# Patient Record
Sex: Female | Born: 1940 | Race: White | Hispanic: No | Marital: Married | Smoking: Never smoker
Health system: Southern US, Community
[De-identification: ages and names within clinical notes are randomized; demographics above are authoritative.]

## PROBLEM LIST (undated history)

## (undated) DIAGNOSIS — M51369 Other intervertebral disc degeneration, lumbar region without mention of lumbar back pain or lower extremity pain: Secondary | ICD-10-CM

## (undated) DIAGNOSIS — Z8619 Personal history of other infectious and parasitic diseases: Secondary | ICD-10-CM

## (undated) DIAGNOSIS — T8859XA Other complications of anesthesia, initial encounter: Secondary | ICD-10-CM

## (undated) DIAGNOSIS — M1990 Unspecified osteoarthritis, unspecified site: Secondary | ICD-10-CM

## (undated) DIAGNOSIS — R112 Nausea with vomiting, unspecified: Secondary | ICD-10-CM

## (undated) DIAGNOSIS — Z9221 Personal history of antineoplastic chemotherapy: Secondary | ICD-10-CM

## (undated) DIAGNOSIS — H9319 Tinnitus, unspecified ear: Secondary | ICD-10-CM

## (undated) DIAGNOSIS — Z780 Asymptomatic menopausal state: Secondary | ICD-10-CM

## (undated) DIAGNOSIS — Z923 Personal history of irradiation: Secondary | ICD-10-CM

## (undated) DIAGNOSIS — R42 Dizziness and giddiness: Secondary | ICD-10-CM

## (undated) DIAGNOSIS — R569 Unspecified convulsions: Secondary | ICD-10-CM

## (undated) DIAGNOSIS — T4145XA Adverse effect of unspecified anesthetic, initial encounter: Secondary | ICD-10-CM

## (undated) DIAGNOSIS — IMO0002 Reserved for concepts with insufficient information to code with codable children: Secondary | ICD-10-CM

## (undated) DIAGNOSIS — H8390 Unspecified disease of inner ear, unspecified ear: Secondary | ICD-10-CM

## (undated) DIAGNOSIS — C50919 Malignant neoplasm of unspecified site of unspecified female breast: Secondary | ICD-10-CM

## (undated) DIAGNOSIS — Z9889 Other specified postprocedural states: Secondary | ICD-10-CM

## (undated) DIAGNOSIS — M5136 Other intervertebral disc degeneration, lumbar region: Secondary | ICD-10-CM

## (undated) DIAGNOSIS — N200 Calculus of kidney: Secondary | ICD-10-CM

## (undated) HISTORY — PX: TUBAL LIGATION: SHX77

## (undated) HISTORY — PX: COLONOSCOPY: SHX174

## (undated) HISTORY — PX: CHOLECYSTECTOMY: SHX55

## (undated) HISTORY — PX: LITHOTRIPSY: SUR834

---

## 1995-02-22 HISTORY — PX: BREAST SURGERY: SHX581

## 1998-10-29 ENCOUNTER — Ambulatory Visit (HOSPITAL_COMMUNITY): Admission: RE | Admit: 1998-10-29 | Discharge: 1998-10-30 | Payer: Self-pay | Admitting: General Surgery

## 1999-02-09 ENCOUNTER — Encounter: Admission: RE | Admit: 1999-02-09 | Discharge: 1999-02-09 | Payer: Self-pay | Admitting: Internal Medicine

## 1999-04-01 ENCOUNTER — Encounter: Admission: RE | Admit: 1999-04-01 | Discharge: 1999-04-01 | Payer: Self-pay | Admitting: Internal Medicine

## 1999-04-01 ENCOUNTER — Encounter: Payer: Self-pay | Admitting: Internal Medicine

## 1999-09-30 ENCOUNTER — Encounter: Payer: Self-pay | Admitting: Emergency Medicine

## 1999-09-30 ENCOUNTER — Emergency Department (HOSPITAL_COMMUNITY): Admission: EM | Admit: 1999-09-30 | Discharge: 1999-09-30 | Payer: Self-pay | Admitting: Emergency Medicine

## 1999-10-04 ENCOUNTER — Encounter: Payer: Self-pay | Admitting: Urology

## 1999-10-04 ENCOUNTER — Ambulatory Visit (HOSPITAL_COMMUNITY): Admission: RE | Admit: 1999-10-04 | Discharge: 1999-10-04 | Payer: Self-pay | Admitting: Urology

## 2000-04-04 ENCOUNTER — Encounter: Payer: Self-pay | Admitting: Internal Medicine

## 2000-04-04 ENCOUNTER — Encounter: Admission: RE | Admit: 2000-04-04 | Discharge: 2000-04-04 | Payer: Self-pay | Admitting: Internal Medicine

## 2001-02-19 ENCOUNTER — Encounter: Admission: RE | Admit: 2001-02-19 | Discharge: 2001-02-19 | Payer: Self-pay | Admitting: Internal Medicine

## 2001-02-19 ENCOUNTER — Encounter: Payer: Self-pay | Admitting: Internal Medicine

## 2001-04-06 ENCOUNTER — Encounter: Payer: Self-pay | Admitting: Internal Medicine

## 2001-04-06 ENCOUNTER — Encounter: Admission: RE | Admit: 2001-04-06 | Discharge: 2001-04-06 | Payer: Self-pay | Admitting: Internal Medicine

## 2002-04-17 ENCOUNTER — Encounter: Admission: RE | Admit: 2002-04-17 | Discharge: 2002-04-17 | Payer: Self-pay | Admitting: Internal Medicine

## 2002-04-17 ENCOUNTER — Encounter: Payer: Self-pay | Admitting: Internal Medicine

## 2002-04-24 ENCOUNTER — Encounter: Payer: Self-pay | Admitting: Internal Medicine

## 2002-04-24 ENCOUNTER — Encounter: Admission: RE | Admit: 2002-04-24 | Discharge: 2002-04-24 | Payer: Self-pay | Admitting: Internal Medicine

## 2003-05-23 ENCOUNTER — Encounter: Admission: RE | Admit: 2003-05-23 | Discharge: 2003-05-23 | Payer: Self-pay | Admitting: Dentistry

## 2003-06-23 ENCOUNTER — Encounter (INDEPENDENT_AMBULATORY_CARE_PROVIDER_SITE_OTHER): Payer: Self-pay | Admitting: Specialist

## 2003-06-23 ENCOUNTER — Ambulatory Visit (HOSPITAL_COMMUNITY): Admission: RE | Admit: 2003-06-23 | Discharge: 2003-06-23 | Payer: Self-pay | Admitting: Gastroenterology

## 2004-06-04 ENCOUNTER — Encounter: Admission: RE | Admit: 2004-06-04 | Discharge: 2004-06-04 | Payer: Self-pay | Admitting: Internal Medicine

## 2005-06-08 ENCOUNTER — Encounter: Admission: RE | Admit: 2005-06-08 | Discharge: 2005-06-08 | Payer: Self-pay | Admitting: Internal Medicine

## 2006-06-13 ENCOUNTER — Encounter: Admission: RE | Admit: 2006-06-13 | Discharge: 2006-06-13 | Payer: Self-pay | Admitting: Internal Medicine

## 2007-07-09 ENCOUNTER — Encounter: Admission: RE | Admit: 2007-07-09 | Discharge: 2007-07-09 | Payer: Self-pay | Admitting: Internal Medicine

## 2008-07-09 ENCOUNTER — Encounter: Admission: RE | Admit: 2008-07-09 | Discharge: 2008-07-09 | Payer: Self-pay | Admitting: Internal Medicine

## 2009-07-13 ENCOUNTER — Encounter: Admission: RE | Admit: 2009-07-13 | Discharge: 2009-07-13 | Payer: Self-pay | Admitting: Internal Medicine

## 2009-07-21 ENCOUNTER — Encounter: Admission: RE | Admit: 2009-07-21 | Discharge: 2009-07-21 | Payer: Self-pay | Admitting: Internal Medicine

## 2010-03-14 ENCOUNTER — Encounter: Payer: Self-pay | Admitting: Internal Medicine

## 2010-07-09 NOTE — Op Note (Signed)
NAME:  Kimberly [name], Kimberly [name]                         ACCOUNT NO.:  [id number]   MEDICAL RECORD NO.:  [id number]                   PATIENT TYPE:  AMB   LOCATION:  ENDO                                 FACILITY:  [facility]   PHYSICIAN:  Bernette Redbird, M.D.                DATE OF BIRTH:  03/24/1940   DATE OF PROCEDURE:  06/23/2003  DATE OF DISCHARGE:                                 OPERATIVE REPORT   PROCEDURE:  Colonoscopy with biopsy.   INDICATIONS:  Family history of colon polyps in a 70 year old female.   FINDINGS:  Diminutive early polyp in the ascending colon, removed by cold  biopsy.   DESCRIPTION OF PROCEDURE:  The nature, purpose and risks of this procedure  have been discussed with the patient who provided written consent.  Sedation  was fentanyl 50 mcg and Versed 5 mg IV without arrhythmias or desaturation.  The Olympus adjustable tension pediatric video coloscope was easily advanced  to the cecum as identified by clear visualization of the appendiceal orifice  and pull-back was then performed.  The quality of the prep was excellent and  it was felt that all areas were well seen.   There was a 2 mm sessile lesion consistent with an early polyp or perhaps a  mucosal tag in the mid ascending colonoscopy removed by a single cold  biopsy.  No other polyps were seen and there was n evidence of cancer,  colitis, vascular malformations, or diverticulosis.  Retroflexion was  attempted in the rectum but could not readily be accomplished, but careful  antegrade viewing of the rectum as well as reinspection of the rectosigmoid  was unremarkable.  The patient tolerated the procedure well and there were  no apparent complications.   IMPRESSION:  1. Colon polyp removed by cold biopsy technique as described above.     (211.3).  2. Family history of colon polyps.   PLAN:  Await pathology, probable colonoscopic followup in five years in view  of the family history.                        Bernette Redbird, M.D.    RB/MEDQ  D:  06/23/2003  T:  06/23/2003  Job:  [id number]   cc:   Bethann Punches  [address] [place]  [place] [address]  Fax: [phone]

## 2010-07-12 ENCOUNTER — Other Ambulatory Visit: Payer: Self-pay | Admitting: Internal Medicine

## 2010-07-12 DIAGNOSIS — Z1231 Encounter for screening mammogram for malignant neoplasm of breast: Secondary | ICD-10-CM

## 2010-08-04 ENCOUNTER — Ambulatory Visit
Admission: RE | Admit: 2010-08-04 | Discharge: 2010-08-04 | Disposition: A | Discharge status: Home / Self Care | Payer: Medicare Other | Source: Ambulatory Visit | Attending: Internal Medicine | Admitting: Internal Medicine

## 2010-08-04 DIAGNOSIS — Z1231 Encounter for screening mammogram for malignant neoplasm of breast: Secondary | ICD-10-CM

## 2010-12-04 ENCOUNTER — Ambulatory Visit: Payer: Self-pay

## 2011-07-19 ENCOUNTER — Other Ambulatory Visit: Payer: Self-pay | Admitting: Internal Medicine

## 2011-07-19 DIAGNOSIS — Z1231 Encounter for screening mammogram for malignant neoplasm of breast: Secondary | ICD-10-CM

## 2011-08-09 ENCOUNTER — Ambulatory Visit
Admission: RE | Admit: 2011-08-09 | Discharge: 2011-08-09 | Disposition: A | Discharge status: Home / Self Care | Payer: BC Managed Care – PPO | Source: Ambulatory Visit | Attending: Internal Medicine | Admitting: Internal Medicine

## 2011-08-09 DIAGNOSIS — Z1231 Encounter for screening mammogram for malignant neoplasm of breast: Secondary | ICD-10-CM

## 2012-04-10 ENCOUNTER — Other Ambulatory Visit: Payer: Self-pay | Admitting: Orthopedic Surgery

## 2012-04-10 MED ORDER — BUPIVACAINE LIPOSOME 1.3 % IJ SUSP: 20.0000 mL | Freq: Once | INTRAMUSCULAR | Status: DC

## 2012-04-10 MED ORDER — DEXAMETHASONE SODIUM PHOSPHATE 10 MG/ML IJ SOLN: 10.0000 mg | Freq: Once | INTRAMUSCULAR | Status: DC

## 2012-04-10 NOTE — Progress Notes (Signed)
Preoperative surgical orders have been place into the Epic hospital system for TRISHA KEN on 04/10/2012, 12:08 PM  by Patrica Duel for surgery on 05/14/12.  Preop Total Knee orders including Experal, IV Tylenol, and IV Decadron as long as there are no contraindications to the above medications. Avel Peace, PA-C

## 2012-04-27 ENCOUNTER — Encounter (HOSPITAL_COMMUNITY): Payer: Self-pay | Admitting: Pharmacy Technician

## 2012-05-03 ENCOUNTER — Encounter (HOSPITAL_COMMUNITY)
Admission: RE | Admit: 2012-05-03 | Discharge: 2012-05-03 | Disposition: A | Discharge status: Home / Self Care | Payer: Medicare Other | Source: Ambulatory Visit | Attending: Orthopedic Surgery | Admitting: Orthopedic Surgery

## 2012-05-03 ENCOUNTER — Encounter (HOSPITAL_COMMUNITY): Payer: Self-pay

## 2012-05-03 HISTORY — DX: Other intervertebral disc degeneration, lumbar region: M51.36

## 2012-05-03 HISTORY — DX: Other intervertebral disc degeneration, lumbar region without mention of lumbar back pain or lower extremity pain: M51.369

## 2012-05-03 HISTORY — DX: Unspecified osteoarthritis, unspecified site: M19.90

## 2012-05-03 HISTORY — DX: Unspecified convulsions: R56.9

## 2012-05-03 LAB — URINALYSIS, ROUTINE W REFLEX MICROSCOPIC
Glucose, UA: NEGATIVE mg/dL
Hgb urine dipstick: NEGATIVE
Leukocytes, UA: NEGATIVE
pH: 5 (ref 5.0–8.0)

## 2012-05-03 LAB — SURGICAL PCR SCREEN
MRSA, PCR: NEGATIVE
Staphylococcus aureus: POSITIVE — AB

## 2012-05-03 LAB — CBC
Hemoglobin: 14.2 g/dL (ref 12.0–15.0)
MCH: 28.2 pg (ref 26.0–34.0)
MCV: 88.3 fL (ref 78.0–100.0)
Platelets: 262 10*3/uL (ref 150–400)
RBC: 5.04 MIL/uL (ref 3.87–5.11)
WBC: 6 10*3/uL (ref 4.0–10.5)

## 2012-05-03 LAB — COMPREHENSIVE METABOLIC PANEL
ALT: 36 U/L — ABNORMAL HIGH (ref 0–35)
AST: 36 U/L (ref 0–37)
CO2: 28 mEq/L (ref 19–32)
Calcium: 10.1 mg/dL (ref 8.4–10.5)
Chloride: 105 mEq/L (ref 96–112)
Creatinine, Ser: 0.82 mg/dL (ref 0.50–1.10)
GFR calc Af Amer: 83 mL/min — ABNORMAL LOW (ref >90)
GFR calc non Af Amer: 71 mL/min — ABNORMAL LOW (ref >90)
Glucose, Bld: 102 mg/dL — ABNORMAL HIGH (ref 70–99)
Sodium: 140 mEq/L (ref 135–145)
Total Bilirubin: 0.4 mg/dL (ref 0.3–1.2)

## 2012-05-03 NOTE — Patient Instructions (Signed)
YOUR SURGERY IS SCHEDULED AT [facility]  ON:  Monday  3/24  REPORT TO [place] [facility] AT:  5:00 AM      PHONE # FOR SHORT STAY IS [phone]  DO NOT EAT OR DRINK ANYTHING AFTER MIDNIGHT THE NIGHT BEFORE YOUR SURGERY.  YOU MAY BRUSH YOUR TEETH, RINSE OUT YOUR MOUTH--BUT NO WATER, NO FOOD, NO CHEWING GUM, NO MINTS, NO CANDIES, NO CHEWING TOBACCO.  PLEASE TAKE THE FOLLOWING MEDICATIONS THE AM OF YOUR SURGERY WITH A FEW SIPS OF WATER:  CLARITIN  IF YOU USE INHALERS--USE YOUR INHALERS THE AM OF YOUR SURGERY AND BRING INHALERS TO THE HOSPITAL.    IF YOU ARE DIABETIC:  DO NOT TAKE ANY DIABETIC MEDICATIONS THE AM OF YOUR SURGERY.  IF YOU TAKE INSULIN IN THE EVENINGS--PLEASE ONLY TAKE 1/2 NORMAL EVENING DOSE THE NIGHT BEFORE YOUR SURGERY.  NO INSULIN THE AM OF YOUR SURGERY.  IF YOU HAVE SLEEP APNEA AND USE CPAP OR BIPAP--PLEASE BRING THE MASK AND THE TUBING.  DO NOT BRING YOUR MACHINE.  DO NOT BRING VALUABLES, MONEY, CREDIT CARDS.  DO NOT WEAR JEWELRY, MAKE-UP, NAIL POLISH AND NO METAL PINS OR CLIPS IN YOUR HAIR. CONTACT LENS, DENTURES / PARTIALS, GLASSES SHOULD NOT BE WORN TO SURGERY AND IN MOST CASES-HEARING AIDS WILL NEED TO BE REMOVED.  BRING YOUR GLASSES CASE, ANY EQUIPMENT NEEDED FOR YOUR CONTACT LENS. FOR PATIENTS ADMITTED TO THE HOSPITAL--CHECK OUT TIME THE DAY OF DISCHARGE IS 11:00 AM.  ALL INPATIENT ROOMS ARE PRIVATE - WITH BATHROOM, TELEPHONE, TELEVISION AND WIFI INTERNET.  IF YOU ARE BEING DISCHARGED THE SAME DAY OF YOUR SURGERY--YOU CAN NOT DRIVE YOURSELF HOME--AND SHOULD NOT GO HOME ALONE BY TAXI OR BUS.  NO DRIVING OR OPERATING MACHINERY FOR 24 HOURS FOLLOWING ANESTHESIA / PAIN MEDICATIONS.  PLEASE MAKE ARRANGEMENTS FOR SOMEONE TO BE WITH YOU AT HOME THE FIRST 24 HOURS AFTER SURGERY. RESPONSIBLE DRIVER'S NAME___________________________                                               PHONE #   _______________________                               PLEASE READ OVER  ANY  FACT SHEETS THAT YOU WERE GIVEN: MRSA INFORMATION, BLOOD TRANSFUSION INFORMATION, INCENTIVE SPIROMETER INFORMATION. FAILURE TO FOLLOW THESE INSTRUCTIONS MAY RESULT IN THE CANCELLATION OF YOUR SURGERY.   PATIENT SIGNATURE_________________________________

## 2012-05-03 NOTE — Pre-Procedure Instructions (Signed)
PT BROUGHT HER EKG REPORT FROM DR. M. Hyacinth Meeker - DONE 03/28/12.  HER MEDICAL CLEARANCE FROM DR. MILLER IS ON HER CHART.  CXR NOT NEEDED PREOP.

## 2012-05-13 ENCOUNTER — Other Ambulatory Visit: Payer: Self-pay | Admitting: Orthopedic Surgery

## 2012-05-13 NOTE — H&P (Signed)
Kimberly [name]  DOB: November 21, 1940 Married / Language: English / Race: White Female  Date of Admission:  05/14/2012  Chief Complaint:  Right Knee Pain  History of Present Illness The patient is a 72 year old female who comes in for a preoperative History and Physical. The patient is scheduled for a right total knee arthroplasty to be performed by Dr. Gus Rankin. [name], MD at [facility] on 05/14/2012. The patient is a 72 year old female who presents for follow up of their knee. The patient is being followed for their bilateral osteoarthritis. They are now 6 week(s) out from bilateral cortisone injections (that did not provide any relief). Symptoms reported today include: pain (constant). The following medication has been used for pain control: Tylenol (arthritis). The patient did not have any benefit at all from the cortisone injections. Her right knee is far worse than the left. It is now giving out on her to the point where she is fearful of falling. She has had several near falls because of that knee. She called earlier and gone ahead and scheduled the right total knee as had been previously discussed. She is ready to proceed. They have been treated conservatively in the past for the above stated problem and despite conservative measures, they continue to have progressive pain and severe functional limitations and dysfunction. They have failed non-operative management including home exercise, medications, and injections. It is felt that they would benefit from undergoing total joint replacement. Risks and benefits of the procedure have been discussed with the patient and they elect to proceed with surgery. There are no active contraindications to surgery such as ongoing infection or rapidly progressive neurological disease.    Problem List Osteoarthritis, knee (715.96)   Allergies Codeine/Codeine Derivatives   Family History Cancer. father Cerebrovascular  Accident. father Congestive Heart Failure. mother Depression. mother Heart Disease. father Kidney disease. grandmother mothers side   Social History Tobacco use. Never smoker. never smoker Alcohol use. never consumed alcohol Children. 1 Current work status. retired Financial planner (Currently). no Exercise. Exercises never Illicit drug use. no Living situation. live with spouse Marital status. married Number of flights of stairs before winded. less than 1 Pain Contract. no Previously in rehab. no Tobacco / smoke exposure. no   Medication History Tylenol (1 (one) Oral) Specific dose unknown - Active. Vitamin C ( Oral) Specific dose unknown - Active. Vitamin B6 ( Oral) Specific dose unknown - Active. Calcium 600 ( Oral) Specific dose unknown - Active. Claritin ( Oral) Specific dose unknown - Active. Multivitamins ( Oral) Specific dose unknown - Active. Vitamin D ( Oral) Specific dose unknown - Active.   Past Surgical History Gallbladder Surgery. laporoscopic Other Surgery. KIDNEY STONE REMOVAL LEFT Tubal Ligation   Medical History Kidney Stone Seizure Disorder. one episode in 1963 Measles Mumps Menopause Degenerative Disc Disease   Review of Systems General:Not Present- Chills, Fever, Night Sweats, Appetite Loss, Fatigue, Feeling sick, Weight Gain and Weight Loss. Skin:Not Present- Itching, Rash, Skin Color Changes, Ulcer, Psoriasis and Change in Hair or Nails. HEENT:Not Present- Sensitivity to light, Nose Bleed, Visual Loss, Decreased Hearing and Ringing in the Ears. Neck:Not Present- Swollen Glands and Neck Mass. Respiratory:Not Present- Shortness of breath, Snoring, Chronic Cough and Bloody sputum. Cardiovascular:Not Present- Shortness of Breath, Chest Pain, Swelling of Extremities, Leg Cramps and Palpitations. Gastrointestinal:Not Present- Bloody Stool, Heartburn, Abdominal Pain, Vomiting, Nausea and Incontinence of  Stool. Female Genitourinary:Not Present- Blood in Urine, Irregular/missing periods, Frequency, Incontinence and Nocturia. Musculoskeletal:Present- Joint Stiffness,  Joint Pain and Back Pain. Not Present- Muscle Weakness, Muscle Pain and Joint Swelling. Neurological:Not Present- Tingling, Numbness, Burning, Tremor, Headaches and Dizziness. Psychiatric:Not Present- Anxiety, Depression and Memory Loss. Endocrine:Not Present- Cold Intolerance, Heat Intolerance, Excessive hunger and Excessive Thirst. Hematology:Not Present- Abnormal Bleeding, Abnormal bruising, Anemia and Blood Clots.   Vitals Weight: 229 lb Height: 65 in Weight was reported by patient. Height was reported by patient. Body Surface Area: 2.18 m Body Mass Index: 38.11 kg/m Pulse: 60 (Regular) Resp.: 12 (Unlabored) BP: 122/78 (Sitting, Right Arm, Standard)    Physical Exam The physical exam findings are as follows:  Note: Patient is a 72 year old female with continued knee pain. Patient is accompanied today by her husband.   General Mental Status - Alert, cooperative and good historian. General Appearance- pleasant. Not in acute distress. Orientation- Oriented X3. Build & Nutrition- Well nourished and Well developed.   Head and Neck Head- normocephalic, atraumatic . Neck Global Assessment- supple. no bruit auscultated on the right and no bruit auscultated on the left.   Eye Pupil- Bilateral- Regular and Round. Motion- Bilateral- EOMI.   Chest and Lung Exam Auscultation: Breath sounds:- clear at anterior chest wall and - clear at posterior chest wall. Adventitious sounds:- No Adventitious sounds.   Cardiovascular Auscultation:Rhythm- Regular rate and rhythm. Heart Sounds- S1 WNL and S2 WNL. Murmurs & Other Heart Sounds:Auscultation of the heart reveals - No Murmurs.   Abdomen Palpation/Percussion:Tenderness- Abdomen is non-tender to palpation. Rigidity  (guarding)- Abdomen is soft. Auscultation:Auscultation of the abdomen reveals - Bowel sounds normal.   Female Genitourinary  Not done, not pertinent to present illness  Musculoskeletal On exam well developed female in no distress. Her right knee shows a valgus deformity. Her range of motion is about 5 to 125. There is no instability noted.  RADIOGRAPHS: AP both knees and lateral of the right taken a couple months ago, and shows bone on bone arthritis lateral and patellofemoral compartments of the right knee with about 10-15 degrees valgus deformity.  Assessment & Plan Osteoarthritis, knee (715.96) Impression: Right Knee  Note: Plan is for a Right Total Knee Replacement by Dr. Lequita Halt.  Plan is to go home.  PCP - Dr. Bethann Punches Patient has been seen preoperatively and felt to be stable for surgery.   Signed electronically by Roberts Gaudy, PA-C

## 2012-05-14 ENCOUNTER — Encounter (HOSPITAL_COMMUNITY): Payer: Self-pay | Admitting: *Deleted

## 2012-05-14 ENCOUNTER — Inpatient Hospital Stay (HOSPITAL_COMMUNITY)
Admission: RE | Admit: 2012-05-14 | Discharge: 2012-05-16 | DRG: 470 | Disposition: A | Discharge status: Home Health | Payer: Medicare Other | Source: Ambulatory Visit | Attending: Orthopedic Surgery | Admitting: Orthopedic Surgery

## 2012-05-14 ENCOUNTER — Inpatient Hospital Stay (HOSPITAL_COMMUNITY): Payer: Medicare Other | Admitting: *Deleted

## 2012-05-14 ENCOUNTER — Encounter (HOSPITAL_COMMUNITY)
Admission: RE | Disposition: A | Discharge status: Home Health | Payer: Self-pay | Source: Ambulatory Visit | Attending: Orthopedic Surgery

## 2012-05-14 DIAGNOSIS — Z01812 Encounter for preprocedural laboratory examination: Secondary | ICD-10-CM

## 2012-05-14 DIAGNOSIS — M1710 Unilateral primary osteoarthritis, unspecified knee: Principal | ICD-10-CM | POA: Diagnosis present

## 2012-05-14 DIAGNOSIS — Z96651 Presence of right artificial knee joint: Secondary | ICD-10-CM

## 2012-05-14 DIAGNOSIS — M179 Osteoarthritis of knee, unspecified: Secondary | ICD-10-CM | POA: Diagnosis present

## 2012-05-14 HISTORY — PX: TOTAL KNEE ARTHROPLASTY: SHX125

## 2012-05-14 SURGERY — ARTHROPLASTY, KNEE, TOTAL
Anesthesia: Spinal | Site: Knee | Laterality: Right | Wound class: Clean

## 2012-05-14 MED ORDER — PROPOFOL 10 MG/ML IV EMUL
INTRAVENOUS | Status: DC | PRN
  Administered 2012-05-14: 75 ug/kg/min via INTRAVENOUS

## 2012-05-14 MED ORDER — 0.9 % SODIUM CHLORIDE (POUR BTL) OPTIME
TOPICAL | Status: DC | PRN
  Administered 2012-05-14: 1000 mL

## 2012-05-14 MED ORDER — ACETAMINOPHEN 10 MG/ML IV SOLN
1000.0000 mg | Freq: Four times a day (QID) | INTRAVENOUS | Status: AC
  Administered 2012-05-14 – 2012-05-15 (×4): 1000 mg via INTRAVENOUS
  Filled 2012-05-14 (×7): qty 100

## 2012-05-14 MED ORDER — TRAMADOL HCL 50 MG PO TABS: 50.0000 mg | ORAL_TABLET | Freq: Four times a day (QID) | ORAL | Status: DC | PRN

## 2012-05-14 MED ORDER — CEFAZOLIN SODIUM-DEXTROSE 2-3 GM-% IV SOLR
2.0000 g | Freq: Four times a day (QID) | INTRAVENOUS | Status: AC
  Administered 2012-05-14 (×2): 2 g via INTRAVENOUS
  Filled 2012-05-14 (×2): qty 50

## 2012-05-14 MED ORDER — DEXAMETHASONE SODIUM PHOSPHATE 10 MG/ML IJ SOLN: 10.0000 mg | Freq: Once | INTRAMUSCULAR | Status: DC

## 2012-05-14 MED ORDER — CEFAZOLIN SODIUM-DEXTROSE 2-3 GM-% IV SOLR
2.0000 g | INTRAVENOUS | Status: AC
  Administered 2012-05-14: 2 g via INTRAVENOUS

## 2012-05-14 MED ORDER — BISACODYL 10 MG RE SUPP: 10.0000 mg | Freq: Every day | RECTAL | Status: DC | PRN

## 2012-05-14 MED ORDER — MIDAZOLAM HCL 5 MG/5ML IJ SOLN
INTRAMUSCULAR | Status: DC | PRN
  Administered 2012-05-14 (×2): 1 mg via INTRAVENOUS

## 2012-05-14 MED ORDER — MORPHINE SULFATE 2 MG/ML IJ SOLN
1.0000 mg | INTRAMUSCULAR | Status: DC | PRN
  Administered 2012-05-14: 0.5 mg via INTRAVENOUS
  Filled 2012-05-14: qty 1

## 2012-05-14 MED ORDER — ACETAMINOPHEN 325 MG PO TABS
650.0000 mg | ORAL_TABLET | Freq: Four times a day (QID) | ORAL | Status: DC | PRN
  Administered 2012-05-15 – 2012-05-16 (×2): 650 mg via ORAL
  Filled 2012-05-14 (×2): qty 2

## 2012-05-14 MED ORDER — MENTHOL 3 MG MT LOZG: 1.0000 | LOZENGE | OROMUCOSAL | Status: DC | PRN

## 2012-05-14 MED ORDER — HYDROMORPHONE HCL PF 1 MG/ML IJ SOLN: 0.2500 mg | INTRAMUSCULAR | Status: DC | PRN

## 2012-05-14 MED ORDER — METOCLOPRAMIDE HCL 10 MG PO TABS: 5.0000 mg | ORAL_TABLET | Freq: Three times a day (TID) | ORAL | Status: DC | PRN

## 2012-05-14 MED ORDER — ACETAMINOPHEN 10 MG/ML IV SOLN: 1000.0000 mg | Freq: Once | INTRAVENOUS | Status: DC

## 2012-05-14 MED ORDER — LACTATED RINGERS IV SOLN: INTRAVENOUS | Status: DC

## 2012-05-14 MED ORDER — OXYCODONE HCL 5 MG PO TABS
5.0000 mg | ORAL_TABLET | ORAL | Status: DC | PRN
  Administered 2012-05-14 – 2012-05-16 (×11): 5 mg via ORAL
  Filled 2012-05-14 (×11): qty 1

## 2012-05-14 MED ORDER — BUPIVACAINE LIPOSOME 1.3 % IJ SUSP
20.0000 mL | Freq: Once | INTRAMUSCULAR | Status: DC
  Filled 2012-05-14: qty 20

## 2012-05-14 MED ORDER — DOCUSATE SODIUM 100 MG PO CAPS
100.0000 mg | ORAL_CAPSULE | Freq: Two times a day (BID) | ORAL | Status: DC
  Administered 2012-05-14 – 2012-05-16 (×5): 100 mg via ORAL

## 2012-05-14 MED ORDER — ONDANSETRON HCL 4 MG/2ML IJ SOLN
4.0000 mg | Freq: Four times a day (QID) | INTRAMUSCULAR | Status: DC | PRN
  Administered 2012-05-14: 4 mg via INTRAVENOUS
  Filled 2012-05-14: qty 2

## 2012-05-14 MED ORDER — DIPHENHYDRAMINE HCL 12.5 MG/5ML PO ELIX: 12.5000 mg | ORAL_SOLUTION | ORAL | Status: DC | PRN

## 2012-05-14 MED ORDER — MEPERIDINE HCL 50 MG/ML IJ SOLN: 6.2500 mg | INTRAMUSCULAR | Status: DC | PRN

## 2012-05-14 MED ORDER — BUPIVACAINE IN DEXTROSE 0.75-8.25 % IT SOLN
INTRATHECAL | Status: DC | PRN
  Administered 2012-05-14: 2 mL via INTRATHECAL

## 2012-05-14 MED ORDER — PROMETHAZINE HCL 25 MG/ML IJ SOLN
6.2500 mg | INTRAMUSCULAR | Status: DC | PRN
  Administered 2012-05-14: 6.25 mg via INTRAVENOUS

## 2012-05-14 MED ORDER — POLYETHYLENE GLYCOL 3350 17 G PO PACK: 17.0000 g | PACK | Freq: Every day | ORAL | Status: DC | PRN

## 2012-05-14 MED ORDER — RIVAROXABAN 10 MG PO TABS
10.0000 mg | ORAL_TABLET | Freq: Every day | ORAL | Status: DC
  Administered 2012-05-15 – 2012-05-16 (×2): 10 mg via ORAL
  Filled 2012-05-14 (×3): qty 1

## 2012-05-14 MED ORDER — METHOCARBAMOL 100 MG/ML IJ SOLN: 500.0000 mg | Freq: Four times a day (QID) | INTRAMUSCULAR | Status: DC | PRN

## 2012-05-14 MED ORDER — DEXAMETHASONE 6 MG PO TABS
10.0000 mg | ORAL_TABLET | Freq: Once | ORAL | Status: DC
  Filled 2012-05-14: qty 1

## 2012-05-14 MED ORDER — FLEET ENEMA 7-19 GM/118ML RE ENEM: 1.0000 | ENEMA | Freq: Once | RECTAL | Status: AC | PRN

## 2012-05-14 MED ORDER — PHENYLEPHRINE HCL 10 MG/ML IJ SOLN
INTRAMUSCULAR | Status: DC | PRN
  Administered 2012-05-14: 20 ug via INTRAVENOUS
  Administered 2012-05-14 (×5): 40 ug via INTRAVENOUS

## 2012-05-14 MED ORDER — LORATADINE 10 MG PO TABS
10.0000 mg | ORAL_TABLET | Freq: Every day | ORAL | Status: DC
  Administered 2012-05-14 – 2012-05-16 (×3): 10 mg via ORAL
  Filled 2012-05-14 (×3): qty 1

## 2012-05-14 MED ORDER — SODIUM CHLORIDE 0.9 % IR SOLN
Status: DC | PRN
  Administered 2012-05-14: 1000 mL

## 2012-05-14 MED ORDER — METOCLOPRAMIDE HCL 5 MG/ML IJ SOLN: 5.0000 mg | Freq: Three times a day (TID) | INTRAMUSCULAR | Status: DC | PRN

## 2012-05-14 MED ORDER — EPHEDRINE SULFATE 50 MG/ML IJ SOLN
INTRAMUSCULAR | Status: DC | PRN
  Administered 2012-05-14: 5 mg via INTRAVENOUS

## 2012-05-14 MED ORDER — ACETAMINOPHEN 650 MG RE SUPP: 650.0000 mg | Freq: Four times a day (QID) | RECTAL | Status: DC | PRN

## 2012-05-14 MED ORDER — SODIUM CHLORIDE 0.9 % IJ SOLN
INTRAMUSCULAR | Status: DC | PRN
  Administered 2012-05-14: 08:00:00

## 2012-05-14 MED ORDER — CHLORHEXIDINE GLUCONATE 4 % EX LIQD
60.0000 mL | Freq: Once | CUTANEOUS | Status: DC
  Filled 2012-05-14: qty 60

## 2012-05-14 MED ORDER — ONDANSETRON HCL 4 MG PO TABS: 4.0000 mg | ORAL_TABLET | Freq: Four times a day (QID) | ORAL | Status: DC | PRN

## 2012-05-14 MED ORDER — FENTANYL CITRATE 0.05 MG/ML IJ SOLN
INTRAMUSCULAR | Status: DC | PRN
  Administered 2012-05-14 (×5): 50 ug via INTRAVENOUS

## 2012-05-14 MED ORDER — DEXAMETHASONE SODIUM PHOSPHATE 4 MG/ML IJ SOLN
INTRAMUSCULAR | Status: DC | PRN
  Administered 2012-05-14: 8 mg via INTRAVENOUS

## 2012-05-14 MED ORDER — ONDANSETRON HCL 4 MG/2ML IJ SOLN
INTRAMUSCULAR | Status: DC | PRN
  Administered 2012-05-14 (×2): 2 mg via INTRAVENOUS

## 2012-05-14 MED ORDER — PHENOL 1.4 % MT LIQD: 1.0000 | OROMUCOSAL | Status: DC | PRN

## 2012-05-14 MED ORDER — LACTATED RINGERS IV SOLN
INTRAVENOUS | Status: DC | PRN
  Administered 2012-05-14: 07:00:00 via INTRAVENOUS

## 2012-05-14 MED ORDER — SODIUM CHLORIDE 0.9 % IV SOLN: INTRAVENOUS | Status: DC

## 2012-05-14 MED ORDER — METHOCARBAMOL 500 MG PO TABS
500.0000 mg | ORAL_TABLET | Freq: Four times a day (QID) | ORAL | Status: DC | PRN
  Administered 2012-05-15: 500 mg via ORAL
  Filled 2012-05-14: qty 1

## 2012-05-14 MED ORDER — DEXTROSE-NACL 5-0.9 % IV SOLN
INTRAVENOUS | Status: DC
  Administered 2012-05-14 – 2012-05-15 (×2): via INTRAVENOUS

## 2012-05-14 SURGICAL SUPPLY — 55 items
BAG SPEC THK2 15X12 ZIP CLS (MISCELLANEOUS) ×1
BAG ZIPLOCK 12X15 (MISCELLANEOUS) ×2 IMPLANT
BANDAGE ELASTIC 6 VELCRO ST LF (GAUZE/BANDAGES/DRESSINGS) ×2 IMPLANT
BANDAGE ESMARK 6X9 LF (GAUZE/BANDAGES/DRESSINGS) ×1 IMPLANT
BLADE SAG 18X100X1.27 (BLADE) ×2 IMPLANT
BLADE SAW SGTL 11.0X1.19X90.0M (BLADE) ×2 IMPLANT
BNDG CMPR 9X6 STRL LF SNTH (GAUZE/BANDAGES/DRESSINGS) ×1
BNDG ESMARK 6X9 LF (GAUZE/BANDAGES/DRESSINGS) ×2
BOWL SMART MIX CTS (DISPOSABLE) ×2 IMPLANT
CEMENT HV SMART SET (Cement) ×4 IMPLANT
CLOTH BEACON ORANGE TIMEOUT ST (SAFETY) ×2 IMPLANT
CUFF TOURN SGL QUICK 34 (TOURNIQUET CUFF) ×2
CUFF TRNQT CYL 34X4X40X1 (TOURNIQUET CUFF) ×1 IMPLANT
DRAPE EXTREMITY T 121X128X90 (DRAPE) ×2 IMPLANT
DRAPE POUCH INSTRU U-SHP 10X18 (DRAPES) ×2 IMPLANT
DRAPE U-SHAPE 47X51 STRL (DRAPES) ×2 IMPLANT
DURAPREP 26ML APPLICATOR (WOUND CARE) ×2 IMPLANT
ELECT REM PT RETURN 9FT ADLT (ELECTROSURGICAL) ×2
ELECTRODE REM PT RTRN 9FT ADLT (ELECTROSURGICAL) ×1 IMPLANT
EVACUATOR 1/8 PVC DRAIN (DRAIN) ×2 IMPLANT
FACESHIELD LNG OPTICON STERILE (SAFETY) ×10 IMPLANT
GAUZE VASELINE 3X9 (GAUZE/BANDAGES/DRESSINGS) ×1 IMPLANT
GLOVE BIO SURGEON STRL SZ7.5 (GLOVE) ×2 IMPLANT
GLOVE BIO SURGEON STRL SZ8 (GLOVE) ×2 IMPLANT
GLOVE BIOGEL PI IND STRL 8 (GLOVE) ×2 IMPLANT
GLOVE BIOGEL PI INDICATOR 8 (GLOVE) ×2
GLOVE SURG SS PI 6.5 STRL IVOR (GLOVE) ×4 IMPLANT
GOWN STRL NON-REIN LRG LVL3 (GOWN DISPOSABLE) ×4 IMPLANT
GOWN STRL REIN XL XLG (GOWN DISPOSABLE) ×2 IMPLANT
HANDPIECE INTERPULSE COAX TIP (DISPOSABLE) ×2
IMMOBILIZER KNEE 20 (SOFTGOODS) ×2
IMMOBILIZER KNEE 20 THIGH 36 (SOFTGOODS) ×1 IMPLANT
KIT BASIN OR (CUSTOM PROCEDURE TRAY) ×2 IMPLANT
MANIFOLD NEPTUNE II (INSTRUMENTS) ×2 IMPLANT
NDL SAFETY ECLIPSE 18X1.5 (NEEDLE) ×1 IMPLANT
NEEDLE HYPO 18GX1.5 SHARP (NEEDLE) ×2
NS IRRIG 1000ML POUR BTL (IV SOLUTION) ×2 IMPLANT
PACK TOTAL JOINT (CUSTOM PROCEDURE TRAY) ×2 IMPLANT
PAD ABD 7.5X8 STRL (GAUZE/BANDAGES/DRESSINGS) ×2 IMPLANT
PADDING CAST COTTON 6X4 STRL (CAST SUPPLIES) ×4 IMPLANT
POSITIONER SURGICAL ARM (MISCELLANEOUS) ×2 IMPLANT
SET HNDPC FAN SPRY TIP SCT (DISPOSABLE) ×1 IMPLANT
SPONGE GAUZE 4X4 12PLY (GAUZE/BANDAGES/DRESSINGS) ×2 IMPLANT
STRIP CLOSURE SKIN 1/2X4 (GAUZE/BANDAGES/DRESSINGS) ×4 IMPLANT
STRIP CLOSURE SKIN 1/4X4 (GAUZE/BANDAGES/DRESSINGS) ×1 IMPLANT
SUCTION FRAZIER 12FR DISP (SUCTIONS) ×2 IMPLANT
SUT MNCRL AB 4-0 PS2 18 (SUTURE) ×2 IMPLANT
SUT VIC AB 2-0 CT1 27 (SUTURE) ×6
SUT VIC AB 2-0 CT1 TAPERPNT 27 (SUTURE) ×3 IMPLANT
SUT VLOC 180 0 24IN GS25 (SUTURE) ×2 IMPLANT
SYR 50ML LL SCALE MARK (SYRINGE) ×2 IMPLANT
TOWEL OR 17X26 10 PK STRL BLUE (TOWEL DISPOSABLE) ×4 IMPLANT
TRAY FOLEY CATH 14FRSI W/METER (CATHETERS) ×2 IMPLANT
WATER STERILE IRR 1500ML POUR (IV SOLUTION) ×3 IMPLANT
WRAP KNEE MAXI GEL POST OP (GAUZE/BANDAGES/DRESSINGS) ×4 IMPLANT

## 2012-05-14 NOTE — Anesthesia Procedure Notes (Signed)
Spinal  Patient location during procedure: OR Staffing Anesthesiologist: Laelia Angelo Performed by: anesthesiologist  Preanesthetic Checklist Completed: patient identified, site marked, surgical consent, pre-op evaluation, timeout performed, IV checked, risks and benefits discussed and monitors and equipment checked Spinal Block Patient position: sitting Prep: Betadine Patient monitoring: heart rate, continuous pulse ox and blood pressure Approach: right paramedian Location: L2-3 Injection technique: single-shot Needle Needle type: Spinocan  Needle gauge: 22 G Needle length: 9 cm Additional Notes Expiration date of kit checked and confirmed. Patient tolerated procedure well, without complications.     

## 2012-05-14 NOTE — Anesthesia Preprocedure Evaluation (Addendum)
Anesthesia Evaluation  Patient identified by MRN, date of birth, ID band Patient awake    Reviewed: Allergy & Precautions, H&P , NPO status , Patient's Chart, lab work & pertinent test results  Airway Mallampati: II TM Distance: >3 FB Neck ROM: Full    Dental no notable dental hx. (+) Partial Upper   Pulmonary neg pulmonary ROS,  breath sounds clear to auscultation  Pulmonary exam normal       Cardiovascular negative cardio ROS  Rhythm:Regular Rate:Normal     Neuro/Psych negative neurological ROS  negative psych ROS   GI/Hepatic negative GI ROS, Neg liver ROS,   Endo/Other  negative endocrine ROS  Renal/GU negative Renal ROS  negative genitourinary   Musculoskeletal negative musculoskeletal ROS (+)   Abdominal   Peds negative pediatric ROS (+)  Hematology negative hematology ROS (+)   Anesthesia Other Findings   Reproductive/Obstetrics negative OB ROS                           Anesthesia Physical Anesthesia Plan  ASA: II  Anesthesia Plan: Spinal   Post-op Pain Management:    Induction: Intravenous  Airway Management Planned: Simple Face Mask  Additional Equipment:   Intra-op Plan:   Post-operative Plan: Extubation in OR  Informed Consent: I have reviewed the patients History and Physical, chart, labs and discussed the procedure including the risks, benefits and alternatives for the proposed anesthesia with the patient or authorized representative who has indicated his/her understanding and acceptance.   Dental advisory given  Plan Discussed with: CRNA  Anesthesia Plan Comments:         Anesthesia Quick Evaluation

## 2012-05-14 NOTE — H&P (View-Only) (Signed)
Kimberly [name]  DOB: 10/13/1940 Married / Language: English / Race: White Female  Date of Admission:  05/14/2012  Chief Complaint:  Right Knee Pain  History of Present Illness The patient is a 72 year old female who comes in for a preoperative History and Physical. The patient is scheduled for a right total knee arthroplasty to be performed by Dr. Frank V. [name], MD at [facility] on 05/14/2012. The patient is a 72 year old female who presents for follow up of their knee. The patient is being followed for their bilateral osteoarthritis. They are now 6 week(s) out from bilateral cortisone injections (that did not provide any relief). Symptoms reported today include: pain (constant). The following medication has been used for pain control: Tylenol (arthritis). The patient did not have any benefit at all from the cortisone injections. Her right knee is far worse than the left. It is now giving out on her to the point where she is fearful of falling. She has had several near falls because of that knee. She called earlier and gone ahead and scheduled the right total knee as had been previously discussed. She is ready to proceed. They have been treated conservatively in the past for the above stated problem and despite conservative measures, they continue to have progressive pain and severe functional limitations and dysfunction. They have failed non-operative management including home exercise, medications, and injections. It is felt that they would benefit from undergoing total joint replacement. Risks and benefits of the procedure have been discussed with the patient and they elect to proceed with surgery. There are no active contraindications to surgery such as ongoing infection or rapidly progressive neurological disease.    Problem List Osteoarthritis, knee (715.96)   Allergies Codeine/Codeine Derivatives   Family History Cancer. father Cerebrovascular  Accident. father Congestive Heart Failure. mother Depression. mother Heart Disease. father Kidney disease. grandmother mothers side   Social History Tobacco use. Never smoker. never smoker Alcohol use. never consumed alcohol Children. 1 Current work status. retired Drug/Alcohol Rehab (Currently). no Exercise. Exercises never Illicit drug use. no Living situation. live with spouse Marital status. married Number of flights of stairs before winded. less than 1 Pain Contract. no Previously in rehab. no Tobacco / smoke exposure. no   Medication History Tylenol (1 (one) Oral) Specific dose unknown - Active. Vitamin C ( Oral) Specific dose unknown - Active. Vitamin B6 ( Oral) Specific dose unknown - Active. Calcium 600 ( Oral) Specific dose unknown - Active. Claritin ( Oral) Specific dose unknown - Active. Multivitamins ( Oral) Specific dose unknown - Active. Vitamin D ( Oral) Specific dose unknown - Active.   Past Surgical History Gallbladder Surgery. laporoscopic Other Surgery. KIDNEY STONE REMOVAL LEFT Tubal Ligation   Medical History Kidney Stone Seizure Disorder. one episode in 1963 Measles Mumps Menopause Degenerative Disc Disease   Review of Systems General:Not Present- Chills, Fever, Night Sweats, Appetite Loss, Fatigue, Feeling sick, Weight Gain and Weight Loss. Skin:Not Present- Itching, Rash, Skin Color Changes, Ulcer, Psoriasis and Change in Hair or Nails. HEENT:Not Present- Sensitivity to light, Nose Bleed, Visual Loss, Decreased Hearing and Ringing in the Ears. Neck:Not Present- Swollen Glands and Neck Mass. Respiratory:Not Present- Shortness of breath, Snoring, Chronic Cough and Bloody sputum. Cardiovascular:Not Present- Shortness of Breath, Chest Pain, Swelling of Extremities, Leg Cramps and Palpitations. Gastrointestinal:Not Present- Bloody Stool, Heartburn, Abdominal Pain, Vomiting, Nausea and Incontinence of  Stool. Female Genitourinary:Not Present- Blood in Urine, Irregular/missing periods, Frequency, Incontinence and Nocturia. Musculoskeletal:Present- Joint Stiffness,   Joint Pain and Back Pain. Not Present- Muscle Weakness, Muscle Pain and Joint Swelling. Neurological:Not Present- Tingling, Numbness, Burning, Tremor, Headaches and Dizziness. Psychiatric:Not Present- Anxiety, Depression and Memory Loss. Endocrine:Not Present- Cold Intolerance, Heat Intolerance, Excessive hunger and Excessive Thirst. Hematology:Not Present- Abnormal Bleeding, Abnormal bruising, Anemia and Blood Clots.   Vitals Weight: 229 lb Height: 65 in Weight was reported by patient. Height was reported by patient. Body Surface Area: 2.18 m Body Mass Index: 38.11 kg/m Pulse: 60 (Regular) Resp.: 12 (Unlabored) BP: 122/78 (Sitting, Right Arm, Standard)    Physical Exam The physical exam findings are as follows:  Note: Patient is a 72 year old female with continued knee pain. Patient is accompanied today by her husband.   General Mental Status - Alert, cooperative and good historian. General Appearance- pleasant. Not in acute distress. Orientation- Oriented X3. Build & Nutrition- Well nourished and Well developed.   Head and Neck Head- normocephalic, atraumatic . Neck Global Assessment- supple. no bruit auscultated on the right and no bruit auscultated on the left.   Eye Pupil- Bilateral- Regular and Round. Motion- Bilateral- EOMI.   Chest and Lung Exam Auscultation: Breath sounds:- clear at anterior chest wall and - clear at posterior chest wall. Adventitious sounds:- No Adventitious sounds.   Cardiovascular Auscultation:Rhythm- Regular rate and rhythm. Heart Sounds- S1 WNL and S2 WNL. Murmurs & Other Heart Sounds:Auscultation of the heart reveals - No Murmurs.   Abdomen Palpation/Percussion:Tenderness- Abdomen is non-tender to palpation. Rigidity  (guarding)- Abdomen is soft. Auscultation:Auscultation of the abdomen reveals - Bowel sounds normal.   Female Genitourinary  Not done, not pertinent to present illness  Musculoskeletal On exam well developed female in no distress. Her right knee shows a valgus deformity. Her range of motion is about 5 to 125. There is no instability noted.  RADIOGRAPHS: AP both knees and lateral of the right taken a couple months ago, and shows bone on bone arthritis lateral and patellofemoral compartments of the right knee with about 10-15 degrees valgus deformity.  Assessment & Plan Osteoarthritis, knee (715.96) Impression: Right Knee  Note: Plan is for a Right Total Knee Replacement by Dr. [name].  Plan is to go home.  PCP - Dr. Mark Miller Patient has been seen preoperatively and felt to be stable for surgery.   Signed electronically by DREW L Ekta Dancer, PA-C  

## 2012-05-14 NOTE — Evaluation (Signed)
Physical Therapy Evaluation Patient Details Name: Kimberly [name] MRN: [id number] DOB: [birth year] Today's Date: 05/14/2012 Time: 2130-8657 PT Time Calculation (min): 28 min  PT Assessment / Plan / Recommendation Clinical Impression  Pt presents s/p R TKA POD 0 with decreased strength, ROM and mobility.  tolerated sitting at EOB, however was nauseated and dizzy.  This cleared somewhat, however did not go away, so deferred OOB today.  Once assisted back to bed, pt vomited.  RN notified.  Pt will benefit from skilled PT in acute venue to address deficits.  PT recommends HHPT for follow up at D/C to maximize pts safety and function.     PT Assessment  Patient needs continued PT services    Follow Up Recommendations  Home health PT    Does the patient have the potential to tolerate intense rehabilitation      Barriers to Discharge None      Equipment Recommendations  None recommended by PT    Recommendations for Other Services OT consult   Frequency 7X/week    Precautions / Restrictions Precautions Precautions: Fall;Knee Required Braces or Orthoses: Knee Immobilizer - Right Knee Immobilizer - Right: Discontinue once straight leg raise with < 10 degree lag Restrictions Weight Bearing Restrictions: No Other Position/Activity Restrictions: WBAT   Pertinent Vitals/Pain Pt with minimal pain in knee, however nauseated and dizzy.  (note she has history of vertigo)      Mobility  Bed Mobility Bed Mobility: Supine to Sit Supine to Sit: 4: Min assist;HOB elevated Details for Bed Mobility Assistance: Assist for RLE out of bed with cues for hand placement to self assist.      Exercises     PT Diagnosis: Difficulty walking;Generalized weakness;Acute pain  PT Problem List: Decreased strength;Decreased range of motion;Decreased balance;Decreased mobility;Decreased coordination;Decreased knowledge of use of DME;Decreased knowledge of precautions;Obesity;Pain PT Treatment Interventions:  DME instruction;Gait training;Stair training;Functional mobility training;Therapeutic activities;Therapeutic exercise;Balance training;Patient/family education   PT Goals Acute Rehab PT Goals PT Goal Formulation: With patient Time For Goal Achievement: 05/17/12 Potential to Achieve Goals: Good Pt will go Supine/Side to Sit: with supervision PT Goal: Supine/Side to Sit - Progress: Goal set today Pt will go Sit to Supine/Side: with supervision PT Goal: Sit to Supine/Side - Progress: Goal set today Pt will go Sit to Stand: with supervision PT Goal: Sit to Stand - Progress: Goal set today Pt will go Stand to Sit: with supervision PT Goal: Stand to Sit - Progress: Goal set today Pt will Ambulate: 51 - 150 feet;with supervision;with least restrictive assistive device PT Goal: Ambulate - Progress: Goal set today Pt will Go Up / Down Stairs: 1-2 stairs;with min assist;with least restrictive assistive device PT Goal: Up/Down Stairs - Progress: Goal set today  Visit Information  Last PT Received On: 05/14/12 Assistance Needed: +2 (safety )    Subjective Data  Subjective: I want to try and get up.  Patient Stated Goal: to return home.    Prior Functioning  Home Living Lives With: Spouse Available Help at Discharge: Family;Available 24 hours/day Type of Home: House Home Access: Stairs to enter [employer] of Steps: 2 (1 and 1 small step) Entrance Stairs-Rails: None Home Layout: One level Bathroom Shower/Tub: Engineer, manufacturing systems: Standard Home Adaptive Equipment: Bedside commode/3-in-1;Shower chair with back;Walker - rolling;Straight cane Additional Comments: has toilet riser with handles Prior Function Level of Independence: Independent Able to Take Stairs?: Yes Driving: Yes Vocation: Retired Musician: No difficulties    Copywriter, advertising Overall Cognitive Status:  Appears within functional limits for tasks  assessed/performed Arousal/Alertness: Awake/alert Orientation Level: Appears intact for tasks assessed Behavior During Session: [facility] for tasks performed    Extremity/Trunk Assessment Right Lower Extremity Assessment RLE ROM/Strength/Tone: Deficits RLE ROM/Strength/Tone Deficits: ankle motions WFL, able to initiate SLR, however requires assist to sustain, therefore maintained KI.  RLE Sensation: WFL - Light Touch Left Lower Extremity Assessment LLE ROM/Strength/Tone: WFL for tasks assessed LLE Sensation: WFL - Light Touch Trunk Assessment Trunk Assessment: Normal   Balance Balance Balance Assessed: Yes Static Sitting Balance Static Sitting - Balance Support: Bilateral upper extremity supported;Feet supported Static Sitting - Level of Assistance: 5: Stand by assistance Static Sitting - Comment/# of Minutes: Pt able to sit at EOB at stand by assist.  Unable to stand today due to increased dizziness and nausea.   End of Session PT - End of Session Equipment Utilized During Treatment: Right knee immobilizer Activity Tolerance: Other (comment) (Limited by nausea. ) Patient left: in bed;with call bell/phone within reach;with family/visitor present Nurse Communication: Mobility status (nausea) CPM Right Knee CPM Right Knee: Off  GP     Vista Deck 05/14/2012, 5:00 PM

## 2012-05-14 NOTE — Transfer of Care (Signed)
Immediate Anesthesia Transfer of Care Note  Patient: Kimberly [name]  Procedure(s) Performed: Procedure(s): TOTAL KNEE ARTHROPLASTY (Right)  Patient Location: PACU  Anesthesia Type:Spinal  Level of Consciousness: awake, alert , oriented and patient cooperative  Airway & Oxygen Therapy: Patient Spontanous Breathing and Patient connected to face mask oxygen  Post-op Assessment: Report given to PACU RN and Post -op Vital signs reviewed and stable  Post vital signs: Reviewed and stable  Complications: No apparent anesthesia complications

## 2012-05-14 NOTE — Progress Notes (Signed)
Utilization review completed.  

## 2012-05-14 NOTE — Preoperative (Signed)
Beta Blockers   Reason not to administer Beta Blockers:Not Applicable 

## 2012-05-14 NOTE — Anesthesia Postprocedure Evaluation (Signed)
  Anesthesia Post-op Note  Patient: Kimberly [name]  Procedure(s) Performed: Procedure(s) (LRB): TOTAL KNEE ARTHROPLASTY (Right)  Patient Location: PACU  Anesthesia Type: Spinal  Level of Consciousness: awake and alert   Airway and Oxygen Therapy: Patient Spontanous Breathing  Post-op Pain: mild  Post-op Assessment: Post-op Vital signs reviewed, Patient's Cardiovascular Status Stable, Respiratory Function Stable, Patent Airway and No signs of Nausea or vomiting  Last Vitals:  Filed Vitals:   05/14/12 0848  BP:   Pulse:   Temp: 36.4 C  Resp: 14    Post-op Vital Signs: stable   Complications: No apparent anesthesia complications

## 2012-05-14 NOTE — Op Note (Signed)
Pre-operative diagnosis- Osteoarthritis  Right knee(s)  Post-operative diagnosis- Osteoarthritis Right knee(s)  Procedure-  Right  Total Knee Arthroplasty  Surgeon- Gus Rankin. Briggitte Boline, MD  Assistant- Avel Peace, PA-C   Anesthesia-  Spinal EBL-* No blood loss amount entered *  Drains Hemovac  Tourniquet time-  Total Tourniquet Time Documented: Thigh (Right) - 32 minutes Total: Thigh (Right) - 32 minutes    Complications- None  Condition-PACU - hemodynamically stable.   Brief Clinical Note  Kimberly [name] is a 72 y.o. year old female with end stage OA of her right knee with progressively worsening pain and dysfunction. She has constant pain, with activity and at rest and significant functional deficits with difficulties even with ADLs. She has had extensive non-op management including analgesics, injections of cortisone , and home exercise program, but remains in significant pain with significant dysfunction.Radiographs show bone on bone arthritis lateral and patellofemoral. She presents now for right Total Knee Arthroplasty.    Procedure in detail---   The patient is brought into the operating room and positioned supine on the operating table. After successful administration of  Spinal,   a tourniquet is placed high on the rightthigh(s) and the lower extremity is prepped and draped in the usual sterile fashion. Time out is performed by the operating team and then the right lower extremity is wrapped in Esmarch, knee flexed and the tourniquet inflated to 300 mmHg.       A midline incision is made with a ten blade through the subcutaneous tissue to the level of the extensor mechanism. A fresh blade is used to make a medial parapatellar arthrotomy. Soft tissue over the proximal medial tibia is subperiosteally elevated to the joint line with a knife and into the semimembranosus bursa with a Cobb elevator. Soft tissue over the proximal lateral tibia is elevated with attention being paid to  avoiding the patellar tendon on the tibial tubercle. The patella is everted, knee flexed 90 degrees and the ACL and PCL are removed. Findings are bone on bone lateral and patellofemoral with large lateral osteophytes.        The drill is used to create a starting hole in the distal femur and the canal is thoroughly irrigated with sterile saline to remove the fatty contents. The 5 degree Right  valgus alignment guide is placed into the femoral canal and the distal femoral cutting block is pinned to remove 11 mm off the distal femur. Resection is made with an oscillating saw.      The tibia is subluxed forward and the menisci are removed. The extramedullary alignment guide is placed referencing proximally at the medial aspect of the tibial tubercle and distally along the second metatarsal axis and tibial crest. The block is pinned to remove 2mm off the more deficient lateral  side. Resection is made with an oscillating saw. Size 4is the most appropriate size for the tibia and the proximal tibia is prepared with the modular drill and keel punch for that size.      The femoral sizing guide is placed and size 4 narrow is most appropriate. Rotation is marked off the epicondylar axis and confirmed by creating a rectangular flexion gap at 90 degrees. The size 4 cutting block is pinned in this rotation and the anterior, posterior and chamfer cuts are made with the oscillating saw. The intercondylar block is then placed and that cut is made.      Trial size 4 tibial component, trial size 4 narrowposterior stabilized femur and  a 15  mm posterior stabilized rotating platform insert trial is placed. Full extension is achieved with excellent varus/valgus and anterior/posterior balance throughout full range of motion. The patella is everted and thickness measured to be 24  mm. Free hand resection is taken to 14 mm, a 38 template is placed, lug holes are drilled, trial patella is placed, and it tracks normally. Osteophytes are  removed off the posterior femur with the trial in place. All trials are removed and the cut bone surfaces prepared with pulsatile lavage. Cement is mixed and once ready for implantation, the size 4 tibial implant, size  4 narrow posterior stabilized femoral component, and the size 38 patella are cemented in place and the patella is held with the clamp. The trial insert is placed and the knee held in full extension. The Exparel (20 ml mixed with 50 ml saline) is injected into the extensor mechanism, posterior capsule, medial and lateral gutters and subcutaneous tissues.  All extruded cement is removed and once the cement is hard the permanent 15 mm posterior stabilized rotating platform insert is placed into the tibial tray.      The wound is copiously irrigated with saline solution and the extensor mechanism closed over a hemovac drain with #1 PDS suture. The tourniquet is released for a total tourniquet time of 32  minutes. Flexion against gravity is 140 degrees and the patella tracks normally. Subcutaneous tissue is closed with 2.0 vicryl and subcuticular with running 4.0 Monocryl. The incision is cleaned and dried and steri-strips and a bulky sterile dressing are applied. The limb is placed into a knee immobilizer and the patient is awakened and transported to recovery in stable condition.      Please note that a surgical assistant was a medical necessity for this procedure in order to perform it in a safe and expeditious manner. Surgical assistant was necessary to retract the ligaments and vital neurovascular structures to prevent injury to them and also necessary for proper positioning of the limb to allow for anatomic placement of the prosthesis.   Gus Rankin Lansing Sigmon, MD    05/14/2012, 8:11 AM

## 2012-05-14 NOTE — Interval H&P Note (Signed)
History and Physical Interval Note:  05/14/2012 6:43 AM  Kimberly [name]  has presented today for surgery, with the diagnosis of OSTEOARTHRITIS RIGHT KNEE  The various methods of treatment have been discussed with the patient and family. After consideration of risks, benefits and other options for treatment, the patient has consented to  Procedure(s): TOTAL KNEE ARTHROPLASTY (Right) as a surgical intervention .  The patient's history has been reviewed, patient examined, no change in status, stable for surgery.  I have reviewed the patient's chart and labs.  Questions were answered to the patient's satisfaction.     Loanne Drilling

## 2012-05-15 ENCOUNTER — Encounter (HOSPITAL_COMMUNITY): Payer: Self-pay | Admitting: Orthopedic Surgery

## 2012-05-15 LAB — BASIC METABOLIC PANEL
BUN: 10 mg/dL (ref 6–23)
CO2: 27 mEq/L (ref 19–32)
Chloride: 106 mEq/L (ref 96–112)
Glucose, Bld: 129 mg/dL — ABNORMAL HIGH (ref 70–99)
Potassium: 4.7 mEq/L (ref 3.5–5.1)
Sodium: 139 mEq/L (ref 135–145)

## 2012-05-15 LAB — CBC
HCT: 32.4 % — ABNORMAL LOW (ref 36.0–46.0)
Hemoglobin: 10.7 g/dL — ABNORMAL LOW (ref 12.0–15.0)
RBC: 3.66 MIL/uL — ABNORMAL LOW (ref 3.87–5.11)
WBC: 10.4 10*3/uL (ref 4.0–10.5)

## 2012-05-15 NOTE — Progress Notes (Signed)
Physical Therapy Treatment Patient Details Name: Kimberly [name] MRN: [id number] DOB: [birth year] Today's Date: 05/15/2012 Time: 5621-3086 PT Time Calculation (min): 35 min  PT Assessment / Plan / Recommendation Comments on Treatment Session  Pt progressing very well with all mobility and will practice steps in am in anticipation for D/C tomorrow.     Follow Up Recommendations  Home health PT     Does the patient have the potential to tolerate intense rehabilitation     Barriers to Discharge        Equipment Recommendations  None recommended by PT    Recommendations for Other Services    Frequency 7X/week   Plan Discharge plan remains appropriate    Precautions / Restrictions Precautions Precautions: Fall;Knee Required Braces or Orthoses: Knee Immobilizer - Right Knee Immobilizer - Right: Discontinue once straight leg raise with < 10 degree lag Restrictions Weight Bearing Restrictions: No Other Position/Activity Restrictions: WBAT   Pertinent Vitals/Pain 3/10, ice packs applied    Mobility  Bed Mobility Bed Mobility: Supine to Sit;Sit to Supine Supine to Sit: 4: Min assist Sit to Supine: 4: Min assist Details for Bed Mobility Assistance: Assist for RLE into and out of bed.  Pt demos good UE technique to self assist.  Transfers Transfers: Sit to Stand;Stand to Sit Sit to Stand: 4: Min guard;From elevated surface;With upper extremity assist;From bed Stand to Sit: 4: Min guard;With upper extremity assist;To elevated surface;To bed Details for Transfer Assistance: Min guard for safety with min cues for hand placement.  Ambulation/Gait Ambulation/Gait Assistance: 4: Min guard Ambulation Distance (Feet): 100 Feet Assistive device: Rolling walker Ambulation/Gait Assistance Details: Min cues for decreasing stride length to assist with pain control.  Also min cues for upright posture.  Gait Pattern: Step-to pattern;Decreased stride length;Antalgic;Trunk flexed Gait  velocity: decreased    Exercises Total Joint Exercises Ankle Circles/Pumps: AROM;Both;20 reps Quad Sets: AROM;Right;10 reps Heel Slides: AAROM;Right;10 reps Hip ABduction/ADduction: AAROM;Right;10 reps Straight Leg Raises: AAROM;Right;10 reps   PT Diagnosis:    PT Problem List:   PT Treatment Interventions:     PT Goals Acute Rehab PT Goals PT Goal Formulation: With patient Time For Goal Achievement: 05/17/12 Potential to Achieve Goals: Good Pt will go Supine/Side to Sit: with supervision PT Goal: Supine/Side to Sit - Progress: Progressing toward goal Pt will go Sit to Supine/Side: with supervision PT Goal: Sit to Supine/Side - Progress: Progressing toward goal Pt will go Sit to Stand: with supervision PT Goal: Sit to Stand - Progress: Progressing toward goal Pt will go Stand to Sit: with supervision PT Goal: Stand to Sit - Progress: Progressing toward goal Pt will Ambulate: 51 - 150 feet;with supervision;with least restrictive assistive device PT Goal: Ambulate - Progress: Progressing toward goal  Visit Information  Last PT Received On: 05/15/12 Assistance Needed: +1    Subjective Data  Subjective: How do you think I'm doing.  Patient Stated Goal: to return home.    Cognition  Cognition Overall Cognitive Status: Appears within functional limits for tasks assessed/performed Arousal/Alertness: Awake/alert Orientation Level: Appears intact for tasks assessed Behavior During Session: [facility] for tasks performed    Balance     End of Session PT - End of Session Equipment Utilized During Treatment: Right knee immobilizer Activity Tolerance: Patient tolerated treatment well Patient left: in bed;with call bell/phone within reach;with family/visitor present Nurse Communication: Mobility status   GP     Vista Deck 05/15/2012, 3:03 PM

## 2012-05-15 NOTE — Progress Notes (Signed)
   Subjective: 1 Day Post-Op Procedure(s) (LRB): TOTAL KNEE ARTHROPLASTY (Right) Patient reports pain as mild.   Patient seen in rounds with Dr. Lequita Halt. Husband and daughter in room. Patient is well, and has had no acute complaints or problems We will start therapy today.  Plan is to go Home after hospital stay.  Objective: Vital signs in last 24 hours: Temp:  [96.5 F (35.8 C)-98.3 F (36.8 C)] 98.3 F (36.8 C) (03/25 0617) Pulse Rate:  [51-72] 69 (03/25 0617) Resp:  [10-17] 14 (03/25 0617) BP: (96-117)/(58-77) 99/62 mmHg (03/25 0617) SpO2:  [94 %-100 %] 96 % (03/25 0617) Weight:  [104.781 kg (231 lb)] 104.781 kg (231 lb) (03/24 0955)  Intake/Output from previous day:  Intake/Output Summary (Last 24 hours) at 05/15/12 0818 Last data filed at 05/15/12 0700  Gross per 24 hour  Intake 3908.75 ml  Output   3516 ml  Net 392.75 ml    Intake/Output this shift: UOP 700 since MN +1192  Labs:  Recent Labs  05/15/12 0501  HGB 10.7*    Recent Labs  05/15/12 0501  WBC 10.4  RBC 3.66*  HCT 32.4*  PLT 183    Recent Labs  05/15/12 0501  NA 139  K 4.7  CL 106  CO2 27  BUN 10  CREATININE 0.79  GLUCOSE 129*  CALCIUM 9.0   No results found for this basename: LABPT, INR,  in the last 72 hours  EXAM General - Patient is Alert, Appropriate and Oriented Extremity - Neurovascular intact Sensation intact distally Dorsiflexion/Plantar flexion intact Dressing - dressing C/D/I Motor Function - intact, moving foot and toes well on exam.  Hemovac pulled without difficulty.  Past Medical History  Diagnosis Date  . Arthritis   . Seizures     hx of one seizure in 1963--no known reason for the seizure and none since  . DDD (degenerative disc disease), lumbar     Assessment/Plan: 1 Day Post-Op Procedure(s) (LRB): TOTAL KNEE ARTHROPLASTY (Right) Principal Problem:   OA (osteoarthritis) of knee  Estimated body mass index is 39.63 kg/(m^2) as calculated from the  following:   Height as of this encounter: 5\' 4"  (1.626 m).   Weight as of this encounter: 104.781 kg (231 lb). Advance diet Up with therapy Plan for discharge tomorrow  If doing really well.  DVT Prophylaxis - Xarelto Weight-Bearing as tolerated to right leg No vaccines. D/C O2 and Pulse OX and try on Room [address]  Kimberly [name] 05/15/2012, 8:18 AM

## 2012-05-15 NOTE — Progress Notes (Signed)
Physical Therapy Treatment Patient Details Name: Kimberly [name] MRN: [id number] DOB: 01-27-1941 Today's Date: 05/15/2012 Time: 6295-2841 PT Time Calculation (min): 19 min  PT Assessment / Plan / Recommendation Comments on Treatment Session  Pt able to ambulate in hall this morning with only slight c/o dizziness initially, which cleared with upright stance.      Follow Up Recommendations  Home health PT     Does the patient have the potential to tolerate intense rehabilitation     Barriers to Discharge        Equipment Recommendations  None recommended by PT    Recommendations for Other Services OT consult  Frequency 7X/week   Plan Discharge plan remains appropriate    Precautions / Restrictions Precautions Precautions: Fall;Knee Required Braces or Orthoses: Knee Immobilizer - Right Knee Immobilizer - Right: Discontinue once straight leg raise with < 10 degree lag Restrictions Weight Bearing Restrictions: No Other Position/Activity Restrictions: WBAT   Pertinent Vitals/Pain 4/10, ice packs applied    Mobility  Bed Mobility Bed Mobility: Supine to Sit Supine to Sit: 4: Min assist Details for Bed Mobility Assistance: Assist for RLE out of bed with min cues for hand placement.  Pt continues to have good bed mobility.  Transfers Transfers: Sit to Stand;Stand to Sit Sit to Stand: 4: Min assist;From elevated surface;With upper extremity assist;From bed Stand to Sit: 4: Min assist;With upper extremity assist;With armrests;To chair/3-in-1 Details for Transfer Assistance: Pt progressing well with all mobility and with only slight c/o dizziness today which cleared with being upright.  Ambulation/Gait Ambulation/Gait Assistance: 4: Min assist Ambulation Distance (Feet): 50 Feet Assistive device: Rolling walker Ambulation/Gait Assistance Details: Cues for sequencing/technique with RW and to maintain upright posture.   Gait Pattern: Step-to pattern;Decreased stride  length;Antalgic;Trunk flexed Gait velocity: decreased    Exercises Total Joint Exercises Ankle Circles/Pumps: AROM;Both;20 reps   PT Diagnosis:    PT Problem List:   PT Treatment Interventions:     PT Goals Acute Rehab PT Goals PT Goal Formulation: With patient Time For Goal Achievement: 05/17/12 Potential to Achieve Goals: Good Pt will go Supine/Side to Sit: with supervision PT Goal: Supine/Side to Sit - Progress: Progressing toward goal Pt will go Sit to Stand: with supervision PT Goal: Sit to Stand - Progress: Progressing toward goal Pt will go Stand to Sit: with supervision PT Goal: Stand to Sit - Progress: Progressing toward goal Pt will Ambulate: 51 - 150 feet;with supervision;with least restrictive assistive device PT Goal: Ambulate - Progress: Progressing toward goal  Visit Information  Last PT Received On: 05/15/12 Assistance Needed: +1    Subjective Data  Subjective: I feel a lot better today.  Patient Stated Goal: to return home.    Cognition  Cognition Overall Cognitive Status: Appears within functional limits for tasks assessed/performed Arousal/Alertness: Awake/alert Orientation Level: Appears intact for tasks assessed Behavior During Session: [facility] for tasks performed    Balance     End of Session PT - End of Session Equipment Utilized During Treatment: Right knee immobilizer Activity Tolerance: Patient tolerated treatment well Patient left: in chair;with call bell/phone within reach;with family/visitor present Nurse Communication: Mobility status   GP     Vista Deck 05/15/2012, 9:10 AM

## 2012-05-16 LAB — CBC
HCT: 31.1 % — ABNORMAL LOW (ref 36.0–46.0)
Hemoglobin: 10.1 g/dL — ABNORMAL LOW (ref 12.0–15.0)
MCH: 28.8 pg (ref 26.0–34.0)
MCHC: 32.5 g/dL (ref 30.0–36.0)
RBC: 3.51 MIL/uL — ABNORMAL LOW (ref 3.87–5.11)

## 2012-05-16 LAB — BASIC METABOLIC PANEL
BUN: 12 mg/dL (ref 6–23)
CO2: 28 mEq/L (ref 19–32)
Calcium: 8.8 mg/dL (ref 8.4–10.5)
GFR calc non Af Amer: 84 mL/min — ABNORMAL LOW (ref >90)
Glucose, Bld: 114 mg/dL — ABNORMAL HIGH (ref 70–99)

## 2012-05-16 MED ORDER — RIVAROXABAN 10 MG PO TABS: 10.0000 mg | ORAL_TABLET | Freq: Every day | ORAL | Status: DC

## 2012-05-16 MED ORDER — METHOCARBAMOL 500 MG PO TABS: 500.0000 mg | ORAL_TABLET | Freq: Four times a day (QID) | ORAL | Status: DC | PRN

## 2012-05-16 MED ORDER — HYDROCODONE-ACETAMINOPHEN 7.5-325 MG PO TABS: 1.0000 | ORAL_TABLET | Freq: Four times a day (QID) | ORAL | Status: DC | PRN

## 2012-05-16 MED ORDER — TRAMADOL HCL 50 MG PO TABS: 50.0000 mg | ORAL_TABLET | Freq: Four times a day (QID) | ORAL | Status: DC | PRN

## 2012-05-16 NOTE — Discharge Summary (Signed)
Physician Discharge Summary   Patient ID: Kimberly [name] MRN: [id number] DOB/AGE: 1940/12/25 72 y.o.  Admit date: 05/14/2012 Discharge date: 05/16/2012  Primary Diagnosis:  Osteoarthritis Right knee  Admission Diagnoses:  Past Medical History  Diagnosis Date  . Arthritis   . Seizures     hx of one seizure in 1963--no known reason for the seizure and none since  . DDD (degenerative disc disease), lumbar    Discharge Diagnoses:   Principal Problem:   OA (osteoarthritis) of knee  Estimated body mass index is 39.63 kg/(m^2) as calculated from the following:   Height as of this encounter: 5\' 4"  (1.626 m).   Weight as of this encounter: 104.781 kg (231 lb).  Procedure:  Procedure(s) (LRB): TOTAL KNEE ARTHROPLASTY (Right)   Consults: None  HPI: Kimberly [name] is a 72 y.o. year old female with end stage OA of her right knee with progressively worsening pain and dysfunction. She has constant pain, with activity and at rest and significant functional deficits with difficulties even with ADLs. She has had extensive non-op management including analgesics, injections of cortisone , and home exercise program, but remains in significant pain with significant dysfunction.Radiographs show bone on bone arthritis lateral and patellofemoral. She presents now for right Total Knee Arthroplasty.   Laboratory Data: Admission on 05/14/2012  Component Date Value Range Status  . WBC 05/15/2012 10.4  4.0 - 10.5 K/uL Final  . RBC 05/15/2012 3.66* 3.87 - 5.11 MIL/uL Final  . Hemoglobin 05/15/2012 10.7* 12.0 - 15.0 g/dL Final  . HCT 56/21/3086 32.4* 36.0 - 46.0 % Final  . MCV 05/15/2012 88.5  78.0 - 100.0 fL Final  . MCH 05/15/2012 29.2  26.0 - 34.0 pg Final  . MCHC 05/15/2012 33.0  30.0 - 36.0 g/dL Final  . RDW 57/84/6962 14.3  11.5 - 15.5 % Final  . Platelets 05/15/2012 183  150 - 400 K/uL Final  . Sodium 05/15/2012 139  135 - 145 mEq/L Final  . Potassium 05/15/2012 4.7  3.5 - 5.1 mEq/L Final  .  Chloride 05/15/2012 106  96 - 112 mEq/L Final  . CO2 05/15/2012 27  19 - 32 mEq/L Final  . Glucose, Bld 05/15/2012 129* 70 - 99 mg/dL Final  . BUN 95/28/4132 10  6 - 23 mg/dL Final  . Creatinine, Ser 05/15/2012 0.79  0.50 - 1.10 mg/dL Final  . Calcium 44/02/270 9.0  8.4 - 10.5 mg/dL Final  . GFR calc non Af Amer 05/15/2012 83* >90 mL/min Final  . GFR calc Af Amer 05/15/2012 >90  >90 mL/min Final   Comment:                                 The eGFR has been calculated                          using the CKD EPI equation.                          This calculation has not been                          validated in all clinical                          situations.  eGFR's persistently                          <90 mL/min signify                          possible Chronic Kidney Disease.  . WBC 05/16/2012 10.1  4.0 - 10.5 K/uL Final  . RBC 05/16/2012 3.51* 3.87 - 5.11 MIL/uL Final  . Hemoglobin 05/16/2012 10.1* 12.0 - 15.0 g/dL Final  . HCT 16/11/9602 31.1* 36.0 - 46.0 % Final  . MCV 05/16/2012 88.6  78.0 - 100.0 fL Final  . MCH 05/16/2012 28.8  26.0 - 34.0 pg Final  . MCHC 05/16/2012 32.5  30.0 - 36.0 g/dL Final  . RDW 54/10/8117 14.5  11.5 - 15.5 % Final  . Platelets 05/16/2012 177  150 - 400 K/uL Final  . Sodium 05/16/2012 138  135 - 145 mEq/L Final  . Potassium 05/16/2012 4.1  3.5 - 5.1 mEq/L Final  . Chloride 05/16/2012 104  96 - 112 mEq/L Final  . CO2 05/16/2012 28  19 - 32 mEq/L Final  . Glucose, Bld 05/16/2012 114* 70 - 99 mg/dL Final  . BUN 14/78/2956 12  6 - 23 mg/dL Final  . Creatinine, Ser 05/16/2012 0.77  0.50 - 1.10 mg/dL Final  . Calcium 21/30/8657 8.8  8.4 - 10.5 mg/dL Final  . GFR calc non Af Amer 05/16/2012 84* >90 mL/min Final  . GFR calc Af Amer 05/16/2012 >90  >90 mL/min Final   Comment:                                 The eGFR has been calculated                          using the CKD EPI equation.                          This calculation  has not been                          validated in all clinical                          situations.                          eGFR's persistently                          <90 mL/min signify                          possible Chronic Kidney Disease.  Hospital Outpatient Visit on 05/03/2012  Component Date Value Range Status  . MRSA, PCR 05/03/2012 NEGATIVE  NEGATIVE Final  . Staphylococcus aureus 05/03/2012 POSITIVE* NEGATIVE Final   Comment:                                 The Xpert SA Assay (FDA  approved for NASAL specimens                          in patients over 54 years of age),                          is one component of                          a comprehensive surveillance                          program.  Test performance has                          been validated by [employer] for patients greater                          than or equal to 9 year old.                          It is not intended                          to diagnose infection nor to                          guide or monitor treatment.  Marland Kitchen aPTT 05/03/2012 31  24 - 37 seconds Final  . WBC 05/03/2012 6.0  4.0 - 10.5 K/uL Final  . RBC 05/03/2012 5.04  3.87 - 5.11 MIL/uL Final  . Hemoglobin 05/03/2012 14.2  12.0 - 15.0 g/dL Final  . HCT 16/11/9602 44.5  36.0 - 46.0 % Final  . MCV 05/03/2012 88.3  78.0 - 100.0 fL Final  . MCH 05/03/2012 28.2  26.0 - 34.0 pg Final  . MCHC 05/03/2012 31.9  30.0 - 36.0 g/dL Final  . RDW 54/10/8117 14.5  11.5 - 15.5 % Final  . Platelets 05/03/2012 262  150 - 400 K/uL Final  . Sodium 05/03/2012 140  135 - 145 mEq/L Final  . Potassium 05/03/2012 5.0  3.5 - 5.1 mEq/L Final  . Chloride 05/03/2012 105  96 - 112 mEq/L Final  . CO2 05/03/2012 28  19 - 32 mEq/L Final  . Glucose, Bld 05/03/2012 102* 70 - 99 mg/dL Final  . BUN 14/78/2956 19  6 - 23 mg/dL Final  . Creatinine, Ser 05/03/2012 0.82  0.50 - 1.10 mg/dL Final  . Calcium  21/30/8657 10.1  8.4 - 10.5 mg/dL Final  . Total Protein 05/03/2012 6.9  6.0 - 8.3 g/dL Final  . Albumin 84/69/6295 3.6  3.5 - 5.2 g/dL Final  . AST 28/41/3244 36  0 - 37 U/L Final  . ALT 05/03/2012 36* 0 - 35 U/L Final  . Alkaline Phosphatase 05/03/2012 75  39 - 117 U/L Final  . Total Bilirubin 05/03/2012 0.4  0.3 - 1.2 mg/dL Final  . GFR calc non Af Amer 05/03/2012 71* >90 mL/min Final  . GFR calc Af Amer 05/03/2012 83* >90 mL/min Final   Comment:  The eGFR has been calculated                          using the CKD EPI equation.                          This calculation has not been                          validated in all clinical                          situations.                          eGFR's persistently                          <90 mL/min signify                          possible Chronic Kidney Disease.  Marland Kitchen Prothrombin Time 05/03/2012 12.7  11.6 - 15.2 seconds Final  . INR 05/03/2012 0.96  0.00 - 1.49 Final  . ABO/RH(D) 05/03/2012 O POS   Final  . Antibody Screen 05/03/2012 NEG   Final  . Sample Expiration 05/03/2012 05/17/2012   Final  . Color, Urine 05/03/2012 YELLOW  YELLOW Final  . APPearance 05/03/2012 CLEAR  CLEAR Final  . Specific Gravity, Urine 05/03/2012 1.034* 1.005 - 1.030 Final  . pH 05/03/2012 5.0  5.0 - 8.0 Final  . Glucose, UA 05/03/2012 NEGATIVE  NEGATIVE mg/dL Final  . Hgb urine dipstick 05/03/2012 NEGATIVE  NEGATIVE Final  . Bilirubin Urine 05/03/2012 NEGATIVE  NEGATIVE Final  . Ketones, ur 05/03/2012 NEGATIVE  NEGATIVE mg/dL Final  . Protein, ur 16/11/9602 NEGATIVE  NEGATIVE mg/dL Final  . Urobilinogen, UA 05/03/2012 0.2  0.0 - 1.0 mg/dL Final  . Nitrite 54/10/8117 NEGATIVE  NEGATIVE Final  . Leukocytes, UA 05/03/2012 NEGATIVE  NEGATIVE Final   MICROSCOPIC NOT DONE ON URINES WITH NEGATIVE PROTEIN, BLOOD, LEUKOCYTES, NITRITE, OR GLUCOSE <1000 mg/dL.     X-Rays:No results found.  EKG:No orders found for this or any  previous visit.   Hospital Course: Kimberly [name] is a 72 y.o. who was admitted to [facility]. They were brought to the operating room on 05/14/2012 and underwent Procedure(s): TOTAL KNEE ARTHROPLASTY.  Patient tolerated the procedure well and was later transferred to the recovery room and then to the orthopaedic floor for postoperative care.  They were given PO and IV analgesics for pain control following their surgery.  They were given 24 hours of postoperative antibiotics of  Anti-infectives   Start     Dose/Rate Route Frequency Ordered Stop   05/14/12 1300  ceFAZolin (ANCEF) IVPB 2 g/50 mL premix     2 g 100 mL/hr over 30 Minutes Intravenous Every 6 hours 05/14/12 1017 05/14/12 1908   05/14/12 0600  ceFAZolin (ANCEF) IVPB 2 g/50 mL premix     2 g 100 mL/hr over 30 Minutes Intravenous On call to O.R. 05/14/12 1478 05/14/12 0645     and started on DVT prophylaxis in the form of Xarelto.   PT and OT were ordered for total joint protocol.  Discharge planning consulted to help with postop disposition and equipment needs.  Patient  had a good night on the evening of surgery. Husband and daughter was in room at bedside. They started to get up OOB with therapy on day one. Hemovac drain was pulled without difficulty.  Continued to work with therapy into day two.  Dressing was changed on day two and the incision was healing well.  Patient was seen in rounds and  patient was slightly nauseated that morning. She did a lot of therapy the day before requiring the use of more pain meds that night. She developed some slight nausea so we reduced the pain meds. She got a second session of therapy and then was able to go home later that day.   Discharge Medications: Prior to Admission medications   Medication Sig Start Date End Date Taking? Authorizing Provider  acetaminophen (TYLENOL) 650 MG CR tablet Take 1,300 mg by mouth. Takes 2 every am and 2 every pm   Yes Historical Provider, MD  loratadine  (CLARITIN) 10 MG tablet Take 10 mg by mouth daily.   Yes Historical Provider, MD  HYDROcodone-acetaminophen (NORCO) 7.5-325 MG per tablet Take 1 tablet by mouth every 6 (six) hours as needed. 05/16/12   Leandrew Keech Julien Girt, PA-C  methocarbamol (ROBAXIN) 500 MG tablet Take 1 tablet (500 mg total) by mouth every 6 (six) hours as needed. 05/16/12   Khori Rosevear Julien Girt, PA-C  rivaroxaban (XARELTO) 10 MG TABS tablet Take 1 tablet (10 mg total) by mouth daily with breakfast. 05/16/12   Ketih Goodie Julien Girt, PA-C  traMADol (ULTRAM) 50 MG tablet Take 1-2 tablets (50-100 mg total) by mouth every 6 (six) hours as needed. 05/16/12   Solly Derasmo Julien Girt, PA-C    Diet: Regular diet Activity:WBAT Follow-up:in 2 weeks Disposition - Home Discharged Condition: good   Discharge Orders   Future Orders Complete By Expires     Call MD / Call 911  As directed     Comments:      If you experience chest pain or shortness of breath, CALL 911 and be transported to the hospital emergency room.  If you develope a fever above 101 F, pus (white drainage) or increased drainage or redness at the wound, or calf pain, call your surgeon's office.    Change dressing  As directed     Comments:      Change dressing daily with sterile 4 x 4 inch gauze dressing and apply TED hose. Do not submerge the incision under water.    Constipation Prevention  As directed     Comments:      Drink plenty of fluids.  Prune juice may be helpful.  You may use a stool softener, such as Colace (over the counter) 100 mg twice a day.  Use MiraLax (over the counter) for constipation as needed.    Diet general  As directed     Discharge instructions  As directed     Comments:      Pick up stool softner and laxative for home. Do not submerge incision under water. May shower. Continue to use ice for pain and swelling from surgery.  Take Xarelto for two and a half more weeks, then discontinue Xarelto.    Do not put a pillow under the knee. Place it  under the heel.  As directed     Do not sit on low chairs, stoools or toilet seats, as it may be difficult to get up from low surfaces  As directed     Driving restrictions  As directed     Comments:  No driving until released by the physician.    Increase activity slowly as tolerated  As directed     Lifting restrictions  As directed     Comments:      No lifting until released by the physician.    Patient may shower  As directed     Comments:      You may shower without a dressing once there is no drainage.  Do not wash over the wound.  If drainage remains, do not shower until drainage stops.    TED hose  As directed     Comments:      Use stockings (TED hose) for 3 weeks on both leg(s).  You may remove them at night for sleeping.    Weight bearing as tolerated  As directed         Medication List    STOP taking these medications       CALCIUM 600 PO     glucosamine-chondroitin 500-400 MG tablet     multivitamin with minerals Tabs     pyridoxine 200 MG tablet  Commonly known as:  B-6     vitamin C 1000 MG tablet     Vitamin D 2000 UNITS tablet      TAKE these medications       acetaminophen 650 MG CR tablet  Commonly known as:  TYLENOL  Take 1,300 mg by mouth. Takes 2 every am and 2 every pm     HYDROcodone-acetaminophen 7.5-325 MG per tablet  Commonly known as:  NORCO  Take 1 tablet by mouth every 6 (six) hours as needed.     loratadine 10 MG tablet  Commonly known as:  CLARITIN  Take 10 mg by mouth daily.     methocarbamol 500 MG tablet  Commonly known as:  ROBAXIN  Take 1 tablet (500 mg total) by mouth every 6 (six) hours as needed.     rivaroxaban 10 MG Tabs tablet  Commonly known as:  XARELTO  Take 1 tablet (10 mg total) by mouth daily with breakfast.     traMADol 50 MG tablet  Commonly known as:  ULTRAM  Take 1-2 tablets (50-100 mg total) by mouth every 6 (six) hours as needed.           Follow-up Information   Follow up with  Loanne Drilling, MD. Schedule an appointment as soon as possible for a visit in 2 weeks.   Contact information:   [address] [place] [address] [phone]       Signed: Patrica Duel 05/16/2012, 10:10 AM

## 2012-05-16 NOTE — Progress Notes (Signed)
Physical Therapy Treatment Patient Details Name: Kimberly [name] MRN: [id number] DOB: Jan 25, 1941 Today's Date: 05/16/2012 Time: 4098-1191 PT Time Calculation (min): 24 min  PT Assessment / Plan / Recommendation Comments on Treatment Session  Pm session.  Assisted pt OOB to amb in hallway.  Instructed spouse on proper handling tech.  Pt required increased time.  Instructed on proper car transfer.  Instructed on HEP and given handout. Pt plans to D/C to home today.    Follow Up Recommendations  Home health PT;Supervision - Intermittent     Does the patient have the potential to tolerate intense rehabilitation     Barriers to Discharge        Equipment Recommendations  None recommended by PT    Recommendations for Other Services    Frequency     Plan Discharge plan remains appropriate    Precautions / Restrictions Precautions Precautions: Knee Precaution Comments: Instructed pt and spouse on KI use and when to D/C Required Braces or Orthoses: Knee Immobilizer - Right Knee Immobilizer - Right: Discontinue once straight leg raise with < 10 degree lag Restrictions Weight Bearing Restrictions: No Other Position/Activity Restrictions: WBAT   Pertinent Vitals/Pain C/o 4/10 during session ICE applied    Mobility  Bed Mobility Bed Mobility: Supine to Sit;Sit to Supine Supine to Sit: 5: Supervision;4: Min guard Sit to Supine: 4: Min guard;5: Set up Details for Bed Mobility Assistance: min with RLE; family instructed in asssist plus instructed how to get up on a high bed using step stool stepping up backward with the "good" leg and pushing up from the walker. Transfers Transfers: Sit to Stand;Stand to Sit Sit to Stand: 5: Supervision;From bed;From elevated surface;With upper extremity assist Stand to Sit: 5: Supervision;With upper extremity assist;To bed Details for Transfer Assistance: <25% VC's on safety with turns Ambulation/Gait Ambulation/Gait Assistance: 4: Min guard;5:  Supervision Ambulation Distance (Feet): 145 Feet Assistive device: Rolling walker Ambulation/Gait Assistance Details: <25% VC's on safety with turns and backward gait sequencing Gait Pattern: Step-to pattern;Decreased stride length;Antalgic;Trunk flexed Gait velocity: decreased     PT Goals                                  progressing    Visit Information  Last PT Received On: 05/16/12 Assistance Needed: +1    Subjective Data      Cognition    good   Balance   fair  End of Session PT - End of Session Equipment Utilized During Treatment: Right knee immobilizer Activity Tolerance: Patient tolerated treatment well Patient left: in bed;with call bell/phone within reach;with family/visitor present (ICE to R knee)   Felecia Shelling  PTA [facility]  Acute  Rehab Pager      [phone]

## 2012-05-16 NOTE — Progress Notes (Signed)
   Subjective: 2 Days Post-Op Procedure(s) (LRB): TOTAL KNEE ARTHROPLASTY (Right) Patient reports pain as mild.   Patient seen in rounds for Dr. Lequita Halt.  Husband and daughter at bedside. Patient is slightly nauseated this morning.  She did a lot of therapy yesterday requiring the use of more pain meds last night.  She developed some slight nausea so will reduce the pain meds.  Get a second session of therapy and probably home later today. Patient will be setup to go home later today.  Objective: Vital signs in last 24 hours: Temp:  [98.6 F (37 C)-100.6 F (38.1 C)] 98.9 F (37.2 C) (03/26 0544) Pulse Rate:  [71-93] 77 (03/26 0544) Resp:  [14-16] 16 (03/26 0747) BP: (107-122)/(67-75) 107/67 mmHg (03/26 0544) SpO2:  [95 %-97 %] 96 % (03/26 0544)  Intake/Output from previous day:  Intake/Output Summary (Last 24 hours) at 05/16/12 1001 Last data filed at 05/16/12 0730  Gross per 24 hour  Intake   1020 ml  Output   1700 ml  Net   -680 ml    Intake/Output this shift: Total I/O In: 240 [P.O.:240] Out: -   Labs:  Recent Labs  05/15/12 0501 05/16/12 0429  HGB 10.7* 10.1*    Recent Labs  05/15/12 0501 05/16/12 0429  WBC 10.4 10.1  RBC 3.66* 3.51*  HCT 32.4* 31.1*  PLT 183 177    Recent Labs  05/15/12 0501 05/16/12 0429  NA 139 138  K 4.7 4.1  CL 106 104  CO2 27 28  BUN 10 12  CREATININE 0.79 0.77  GLUCOSE 129* 114*  CALCIUM 9.0 8.8   No results found for this basename: LABPT, INR,  in the last 72 hours  EXAM: General - Patient is Alert, Appropriate and Oriented Extremity - Neurovascular intact Sensation intact distally Dorsiflexion/Plantar flexion intact No cellulitis present Incision - clean, dry, no drainage, healing Motor Function - intact, moving foot and toes well on exam.   Assessment/Plan: 2 Days Post-Op Procedure(s) (LRB): TOTAL KNEE ARTHROPLASTY (Right) Procedure(s) (LRB): TOTAL KNEE ARTHROPLASTY (Right) Past Medical History    Diagnosis Date  . Arthritis   . Seizures     hx of one seizure in 1963--no known reason for the seizure and none since  . DDD (degenerative disc disease), lumbar    Principal Problem:   OA (osteoarthritis) of knee  Estimated body mass index is 39.63 kg/(m^2) as calculated from the following:   Height as of this encounter: 5\' 4"  (1.626 m).   Weight as of this encounter: 104.781 kg (231 lb). Up with therapy Discharge home with home health later today if improves Diet - Regular diet Follow up - in 2 weeks Activity - WBAT Disposition - Home Condition Upon Discharge - Pending at this time. Probably home later today. D/C Meds - See DC Summary DVT Prophylaxis - Xarelto  PERKINS, ALEXZANDREW 05/16/2012, 10:01 AM

## 2012-05-16 NOTE — Progress Notes (Signed)
Pt to d/c home with [facility] home health. Pt has all recommended DME. AVS reviewed and "My Chart" discussed with pt. Pt capable of verbalizing medications and follow-up appointments. Remains hemodynamically stable. No signs and symptoms of distress. Educated pt to return to ER in the case of SOB, dizziness, or chest pain.  

## 2012-05-16 NOTE — Progress Notes (Signed)
Physical Therapy Treatment Patient Details Name: Kimberly [name] MRN: [id number] DOB: [birth year] Today's Date: 05/16/2012 Time: 7829-5621 PT Time Calculation (min): 28 min  PT Assessment / Plan / Recommendation Comments on Treatment Session  progressing well; ready to go home; pt and family comfortable with mobility; mild dizziness this am, pt and dtr state likely due to meds    Follow Up Recommendations  Home health PT;Supervision - Intermittent     Does the patient have the potential to tolerate intense rehabilitation     Barriers to Discharge        Equipment Recommendations  None recommended by PT    Recommendations for Other Services    Frequency 7X/week   Plan Discharge plan remains appropriate    Precautions / Restrictions Precautions Precautions: Knee Required Braces or Orthoses: Knee Immobilizer - Right Knee Immobilizer - Right: Discontinue once straight leg raise with < 10 degree lag Restrictions Weight Bearing Restrictions: No Other Position/Activity Restrictions: WBAT   Pertinent Vitals/Pain Pain controlled; ice to knee after PT session    Mobility  Bed Mobility Bed Mobility: Supine to Sit Supine to Sit: 4: Min assist Sitting - Scoot to Edge of Bed: 4: Min assist Sit to Supine: 5: Supervision;4: Min guard Details for Bed Mobility Assistance: min with RLE; family instructed in asssist Transfers Transfers: Sit to Stand;Stand to Sit Sit to Stand: 5: Supervision;From bed;From chair/3-in-1;From elevated surface;With upper extremity assist Stand to Sit: 5: Supervision;With upper extremity assist;To chair/3-in-1 Details for Transfer Assistance: subtle cues for hand placement and RLE position Ambulation/Gait Ambulation/Gait Assistance: 4: Min guard Ambulation Distance (Feet): 100 Feet Assistive device: Rolling walker Ambulation/Gait Assistance Details: Min cues for decreasing stride length to assist with pain control. Also min cues for upright posture Gait  Pattern: Step-to pattern;Decreased stride length;Antalgic;Trunk flexed Gait velocity: decreased Stairs: Yes Stairs Assistance: 4: Min assist;4: Min IT consultant Details (indicate cue type and reason): cues for sequence; practiced times 2; family able to return demo Stair Management Technique: No rails;With walker;Forwards Number of Stairs: 1 (x 2)    Exercises Total Joint Exercises Ankle Circles/Pumps: AROM;Both;20 reps Quad Sets: AROM;Right;10 reps Heel Slides: AAROM;Right;10 reps Straight Leg Raises: AAROM;Right;10 reps   PT Diagnosis:    PT Problem List:   PT Treatment Interventions:     PT Goals Acute Rehab PT Goals Time For Goal Achievement: 05/17/12 Potential to Achieve Goals: Good Pt will go Supine/Side to Sit: with supervision PT Goal: Supine/Side to Sit - Progress: Progressing toward goal Pt will go Sit to Stand: with supervision PT Goal: Sit to Stand - Progress: Met Pt will go Stand to Sit: with supervision PT Goal: Stand to Sit - Progress: Met Pt will Ambulate: 51 - 150 feet;with supervision;with least restrictive assistive device PT Goal: Ambulate - Progress: Met Pt will Go Up / Down Stairs: 1-2 stairs;with min assist;with least restrictive assistive device PT Goal: Up/Down Stairs - Progress: Met  Visit Information  Last PT Received On: 05/16/12 Assistance Needed: +1    Subjective Data  Subjective: I feel good Patient Stated Goal: to return home.    Cognition  Cognition Overall Cognitive Status: Appears within functional limits for tasks assessed/performed Arousal/Alertness: Awake/alert Orientation Level: Appears intact for tasks assessed Behavior During Session: [facility] for tasks performed    Balance  Balance Balance Assessed: Yes Static Sitting Balance Static Sitting - Balance Support: Bilateral upper extremity supported;Feet supported Static Sitting - Level of Assistance: 6: Modified independent (Device/Increase time)  End of Session PT -  End of Session Equipment Utilized During Treatment: Right knee immobilizer Activity Tolerance: Patient tolerated treatment well Patient left: in chair;with call bell/phone within reach;with family/visitor present Nurse Communication: Mobility status CPM Right Knee CPM Right Knee: Off   GP    Drucilla Chalet, PT Pager: [phone] 05/16/2012  [facility] 05/16/2012, 9:32 AM

## 2012-05-16 NOTE — Evaluation (Signed)
Occupational Therapy Evaluation Patient Details Name: Kimberly [name] MRN: [id number] DOB: [birth year] Today's Date: 05/16/2012 Time: 4098-1191 OT Time Calculation (min): 31 min  OT Assessment / Plan / Recommendation Clinical Impression  Pt is 72y/o female s/p R TKA, she demo supervision level transfers to toilet/chair and overall Min A LB ADL's. Pt was ed in A/E & family will assist @ d/c. Has DME & A/E, no further OT needs at this time.    OT Assessment  Patient does not need any further OT services    Follow Up Recommendations  No OT follow up    Barriers to Discharge      Equipment Recommendations  None recommended by OT    Recommendations for Other Services    Frequency       Precautions / Restrictions Precautions Precautions: Fall;Knee Required Braces or Orthoses: Knee Immobilizer - Right Knee Immobilizer - Right: Discontinue once straight leg raise with < 10 degree lag Restrictions Weight Bearing Restrictions: No Other Position/Activity Restrictions: WBAT   Pertinent Vitals/Pain Pt reports No pain at time of assessment and treatment session.    ADL  Eating/Feeding: Simulated;Independent Where Assessed - Eating/Feeding: Edge of bed Grooming: Performed;Wash/dry hands;Supervision/safety Where Assessed - Grooming: Supported standing;Unsupported standing Upper Body Bathing: Simulated;Supervision/safety;Set up Where Assessed - Upper Body Bathing: Unsupported sitting Lower Body Bathing: Simulated;Minimal assistance Where Assessed - Lower Body Bathing: Supported sit to stand Upper Body Dressing: Simulated;Modified independent;Set up Where Assessed - Upper Body Dressing: Unsupported sitting Lower Body Dressing: Performed;Supervision/safety;Other (comment) (w/ A/E for don/doff socks) Where Assessed - Lower Body Dressing: Unsupported sitting;Supported sit to stand Toilet Transfer: Performed;Supervision/safety Toilet Transfer Method: Sit to Barista:  Raised toilet seat with arms (or 3-in-1 over toilet) Toileting - Clothing Manipulation and Hygiene: Performed;Supervision/safety Where Assessed - Toileting Clothing Manipulation and Hygiene: Sit to stand from 3-in-1 or toilet;Standing Tub/Shower Transfer Method: Not assessed Equipment Used: Gait belt;Knee Immobilizer;Reacher;Rolling walker;Sock aid Transfers/Ambulation Related to ADLs: Pt is overall supervison level transfers and functional mobility in room and bathroom today. ADL Comments: Pt/family education re:A/E as well as bed mobility, answered questions YN:[id number] to/from bed/chair etc. as well as RW positioning during functional activitiy and ADL's (standing at sink/counter top, toilet etc).  Pt has DME & family will provide assist @ d/c. No further OT rec.    OT Diagnosis:    OT Problem List:   OT Treatment Interventions:     OT Goals    Visit Information  Last OT Received On: 05/16/12 Assistance Needed: +1    Subjective Data  Subjective: Pt reports that she plans to d/c home w/ family assist PRN Patient Stated Goal: Home    Prior Functioning     Home Living Lives With: Spouse Available Help at Discharge: Family;Available 24 hours/day Type of Home: House Home Access: Stairs to enter [employer] of Steps: 2 (1 and 1 small step) Entrance Stairs-Rails: None Home Layout: One level Bathroom Shower/Tub: Forensic scientist: Standard Bathroom Accessibility: Yes How Accessible: Accessible via walker Home Adaptive Equipment: Bedside commode/3-in-1;Shower chair with back;Walker - rolling;Straight cane;Hand-held shower hose;Grab bars in shower;Reacher Additional Comments: has toilet riser with handles Prior Function Level of Independence: Independent Able to Take Stairs?: Yes Driving: Yes Vocation: Retired Musician: No difficulties Dominant Hand: Right    Vision/Perception Vision - History Baseline Vision: Wears  glasses only for reading (Has contacts, glasses for reading or going out) Visual History: Cataracts;Other (comment) (Right eye) Patient Visual Report: No change from baseline  Cognition  Cognition Overall Cognitive Status: Appears within functional limits for tasks assessed/performed Arousal/Alertness: Awake/alert Orientation Level: Appears intact for tasks assessed Behavior During Session: [facility] for tasks performed    Extremity/Trunk Assessment Right Upper Extremity Assessment RUE ROM/Strength/Tone: [facility] for tasks assessed;Within functional levels RUE Coordination: WFL - gross/fine motor Left Upper Extremity Assessment LUE ROM/Strength/Tone: Within functional levels;WFL for tasks assessed LUE Coordination: WFL - gross/fine motor Trunk Assessment Trunk Assessment: Normal     Mobility Bed Mobility Bed Mobility: Supine to Sit;Sit to Supine;Sitting - Scoot to Edge of Bed Supine to Sit: 4: Min guard;HOB flat Sitting - Scoot to [place] of Bed: 4: Min guard;5: Supervision Sit to Supine: 5: Supervision;4: Min guard Details for Bed Mobility Assistance: Assist to guide RLE into and out of bed.  Pt demos good UE technique to self assist, pt using LLE to assist RLE into/out of bed after verbal instruction/education.  Transfers Transfers: Sit to Stand;Stand to Sit Sit to Stand: 5: Supervision;From bed;From chair/3-in-1;From elevated surface;With upper extremity assist Stand to Sit: 5: Supervision;To chair/3-in-1;To bed;To elevated surface Details for Transfer Assistance: Supervision level transfers for safety and hand placement        Balance Balance Balance Assessed: Yes Static Sitting Balance Static Sitting - Balance Support: Bilateral upper extremity supported;Feet supported Static Sitting - Level of Assistance: 6: Modified independent (Device/Increase time)   End of Session OT - End of Session Equipment Utilized During Treatment: Gait belt;Right knee immobilizer;Other (comment) (3:1, RW,  Long handled reacher, sock aid) Activity Tolerance: Patient tolerated treatment well Patient left: in bed;with call bell/phone within reach;with family/visitor present CPM Right Knee CPM Right Knee: Off  GO     Alm Bustard 05/16/2012, 8:54 AM

## 2012-05-17 NOTE — Care Management Note (Signed)
    Page 1 of 1   05/17/2012     2:13:40 PM   CARE MANAGEMENT NOTE 05/17/2012  Patient:  Kimberly [name], Kimberly [name]   Account Number:  [id number]  Date Initiated:  05/16/2012  Documentation initiated by:  Colleen Can  Subjective/Objective Assessment:   dx total right knee replacemnt    Pre-arranged with Genevieve Norlander PTA     Action/Plan:   Plans are for home with family as caregiver. Genevieve Norlander will provide HH services-with start date 05/18/2012. Already has DME   Anticipated DC Date:  05/16/2012   Anticipated DC Plan:  HOME W HOME HEALTH SERVICES      DC Planning Services  CM consult      [facility] Choice  HOME HEALTH   Choice offered to / List presented to:             Status of service:  Completed, signed off Medicare Important Message given?   (If response is "NO", the following Medicare IM given date fields will be blank) Date Medicare IM given:   Date Additional Medicare IM given:    Discharge Disposition:  HOME W HOME HEALTH SERVICES  Per UR Regulation:    If discussed at Long Length of Stay Meetings, dates discussed:    Comments:

## 2012-05-17 NOTE — Progress Notes (Signed)
Discharge summary sent to payer through [facility]  

## 2012-06-04 ENCOUNTER — Encounter: Payer: Self-pay | Admitting: Orthopedic Surgery

## 2012-06-21 ENCOUNTER — Encounter: Payer: Self-pay | Admitting: Orthopedic Surgery

## 2012-07-10 ENCOUNTER — Other Ambulatory Visit: Payer: Self-pay

## 2012-07-10 DIAGNOSIS — Z1231 Encounter for screening mammogram for malignant neoplasm of breast: Secondary | ICD-10-CM

## 2012-08-21 ENCOUNTER — Ambulatory Visit
Admission: RE | Admit: 2012-08-21 | Discharge: 2012-08-21 | Disposition: A | Discharge status: Home / Self Care | Payer: BLUE CROSS/BLUE SHIELD | Source: Ambulatory Visit

## 2012-08-21 DIAGNOSIS — Z1231 Encounter for screening mammogram for malignant neoplasm of breast: Secondary | ICD-10-CM

## 2012-09-26 ENCOUNTER — Other Ambulatory Visit: Payer: Self-pay

## 2012-12-27 ENCOUNTER — Other Ambulatory Visit: Payer: Self-pay

## 2013-08-01 ENCOUNTER — Other Ambulatory Visit: Payer: Self-pay

## 2013-08-01 DIAGNOSIS — Z1231 Encounter for screening mammogram for malignant neoplasm of breast: Secondary | ICD-10-CM

## 2013-08-27 ENCOUNTER — Ambulatory Visit
Admission: RE | Admit: 2013-08-27 | Discharge: 2013-08-27 | Disposition: A | Discharge status: Home / Self Care | Payer: Medicare Other | Source: Ambulatory Visit

## 2013-08-27 DIAGNOSIS — Z1231 Encounter for screening mammogram for malignant neoplasm of breast: Secondary | ICD-10-CM

## 2014-02-21 DIAGNOSIS — Z923 Personal history of irradiation: Secondary | ICD-10-CM

## 2014-02-21 DIAGNOSIS — Z9221 Personal history of antineoplastic chemotherapy: Secondary | ICD-10-CM

## 2014-02-21 HISTORY — PX: MASTECTOMY: SHX3

## 2014-02-21 HISTORY — DX: Personal history of irradiation: Z92.3

## 2014-02-21 HISTORY — DX: Personal history of antineoplastic chemotherapy: Z92.21

## 2014-07-31 ENCOUNTER — Other Ambulatory Visit: Payer: Self-pay

## 2014-07-31 DIAGNOSIS — Z1231 Encounter for screening mammogram for malignant neoplasm of breast: Secondary | ICD-10-CM

## 2014-09-02 ENCOUNTER — Ambulatory Visit
Admission: RE | Admit: 2014-09-02 | Discharge: 2014-09-02 | Disposition: A | Discharge status: Home / Self Care | Payer: Medicare Other | Source: Ambulatory Visit

## 2014-09-02 DIAGNOSIS — Z1231 Encounter for screening mammogram for malignant neoplasm of breast: Secondary | ICD-10-CM

## 2014-09-03 ENCOUNTER — Other Ambulatory Visit: Payer: Self-pay | Admitting: Internal Medicine

## 2014-09-03 DIAGNOSIS — R928 Other abnormal and inconclusive findings on diagnostic imaging of breast: Secondary | ICD-10-CM

## 2014-09-04 ENCOUNTER — Other Ambulatory Visit: Payer: Self-pay | Admitting: Internal Medicine

## 2014-09-04 DIAGNOSIS — R928 Other abnormal and inconclusive findings on diagnostic imaging of breast: Secondary | ICD-10-CM

## 2014-09-09 ENCOUNTER — Other Ambulatory Visit: Payer: Medicare Other

## 2014-09-10 ENCOUNTER — Other Ambulatory Visit: Payer: Self-pay | Admitting: Internal Medicine

## 2014-09-10 ENCOUNTER — Ambulatory Visit
Admission: RE | Admit: 2014-09-10 | Discharge: 2014-09-10 | Disposition: A | Discharge status: Home / Self Care | Payer: Medicare Other | Source: Ambulatory Visit | Attending: Internal Medicine | Admitting: Internal Medicine

## 2014-09-10 DIAGNOSIS — R928 Other abnormal and inconclusive findings on diagnostic imaging of breast: Secondary | ICD-10-CM

## 2014-09-10 DIAGNOSIS — C50919 Malignant neoplasm of unspecified site of unspecified female breast: Secondary | ICD-10-CM

## 2014-09-10 HISTORY — DX: Malignant neoplasm of unspecified site of unspecified female breast: C50.919

## 2014-09-11 ENCOUNTER — Ambulatory Visit
Admission: RE | Admit: 2014-09-11 | Discharge: 2014-09-11 | Disposition: A | Discharge status: Home / Self Care | Payer: Medicare Other | Source: Ambulatory Visit | Attending: Internal Medicine | Admitting: Internal Medicine

## 2014-09-11 DIAGNOSIS — R928 Other abnormal and inconclusive findings on diagnostic imaging of breast: Secondary | ICD-10-CM

## 2014-09-17 ENCOUNTER — Encounter: Payer: Self-pay | Admitting: Genetic Counselor

## 2014-09-18 ENCOUNTER — Other Ambulatory Visit: Payer: Self-pay | Admitting: Surgery

## 2014-09-18 DIAGNOSIS — C50912 Malignant neoplasm of unspecified site of left female breast: Secondary | ICD-10-CM

## 2014-09-21 ENCOUNTER — Ambulatory Visit
Admission: RE | Admit: 2014-09-21 | Discharge: 2014-09-21 | Disposition: A | Discharge status: Home / Self Care | Payer: Medicare Other | Source: Ambulatory Visit | Attending: Surgery | Admitting: Surgery

## 2014-09-21 MED ORDER — GADOBENATE DIMEGLUMINE 529 MG/ML IV SOLN
20.0000 mL | Freq: Once | INTRAVENOUS | Status: AC | PRN
  Administered 2014-09-21: 20 mL via INTRAVENOUS

## 2014-10-02 ENCOUNTER — Encounter: Payer: Self-pay | Admitting: Radiation Oncology

## 2014-10-07 ENCOUNTER — Other Ambulatory Visit: Payer: Self-pay | Admitting: Surgery

## 2014-10-07 DIAGNOSIS — C50912 Malignant neoplasm of unspecified site of left female breast: Secondary | ICD-10-CM

## 2014-10-08 ENCOUNTER — Ambulatory Visit: Payer: Medicare Other

## 2014-10-08 ENCOUNTER — Ambulatory Visit
Admission: RE | Admit: 2014-10-08 | Discharge: 2014-10-08 | Disposition: A | Discharge status: Home / Self Care | Payer: Medicare Other | Source: Ambulatory Visit | Attending: Radiation Oncology | Admitting: Radiation Oncology

## 2014-10-08 DIAGNOSIS — C50912 Malignant neoplasm of unspecified site of left female breast: Secondary | ICD-10-CM | POA: Insufficient documentation

## 2014-10-08 DIAGNOSIS — M5136 Other intervertebral disc degeneration, lumbar region: Secondary | ICD-10-CM | POA: Insufficient documentation

## 2014-10-08 DIAGNOSIS — M1990 Unspecified osteoarthritis, unspecified site: Secondary | ICD-10-CM | POA: Insufficient documentation

## 2014-10-08 DIAGNOSIS — Z170 Estrogen receptor positive status [ER+]: Secondary | ICD-10-CM | POA: Insufficient documentation

## 2014-10-08 DIAGNOSIS — Z510 Encounter for antineoplastic radiation therapy: Secondary | ICD-10-CM | POA: Insufficient documentation

## 2014-10-08 DIAGNOSIS — R569 Unspecified convulsions: Secondary | ICD-10-CM | POA: Insufficient documentation

## 2014-10-08 HISTORY — DX: Malignant neoplasm of unspecified site of unspecified female breast: C50.919

## 2014-10-13 ENCOUNTER — Telehealth: Payer: Self-pay | Admitting: Hematology & Oncology

## 2014-10-13 NOTE — Telephone Encounter (Signed)
REFERRING OFFICE(AMY)  CALLED TO CONFIRM AN APPT DATE AND TIME.  AFTER CONTACTING RADIATION DEPT (SHERILY) PT DECIDED FOR A MASTECTOMY.

## 2014-11-14 ENCOUNTER — Encounter (HOSPITAL_BASED_OUTPATIENT_CLINIC_OR_DEPARTMENT_OTHER): Payer: Self-pay | Admitting: *Deleted

## 2014-11-17 ENCOUNTER — Encounter (HOSPITAL_BASED_OUTPATIENT_CLINIC_OR_DEPARTMENT_OTHER)
Admission: RE | Admit: 2014-11-17 | Discharge: 2014-11-17 | Disposition: A | Discharge status: Home / Self Care | Payer: Medicare Other | Source: Ambulatory Visit | Attending: Surgery | Admitting: Surgery

## 2014-11-17 DIAGNOSIS — Z79899 Other long term (current) drug therapy: Secondary | ICD-10-CM | POA: Diagnosis not present

## 2014-11-17 DIAGNOSIS — Z791 Long term (current) use of non-steroidal anti-inflammatories (NSAID): Secondary | ICD-10-CM | POA: Diagnosis not present

## 2014-11-17 DIAGNOSIS — Z170 Estrogen receptor positive status [ER+]: Secondary | ICD-10-CM | POA: Diagnosis not present

## 2014-11-17 DIAGNOSIS — Z7982 Long term (current) use of aspirin: Secondary | ICD-10-CM | POA: Diagnosis not present

## 2014-11-17 DIAGNOSIS — C50412 Malignant neoplasm of upper-outer quadrant of left female breast: Secondary | ICD-10-CM | POA: Diagnosis present

## 2014-11-17 DIAGNOSIS — Z803 Family history of malignant neoplasm of breast: Secondary | ICD-10-CM | POA: Diagnosis not present

## 2014-11-17 DIAGNOSIS — C773 Secondary and unspecified malignant neoplasm of axilla and upper limb lymph nodes: Secondary | ICD-10-CM | POA: Diagnosis not present

## 2014-11-17 DIAGNOSIS — M1990 Unspecified osteoarthritis, unspecified site: Secondary | ICD-10-CM | POA: Diagnosis not present

## 2014-11-17 LAB — CBC WITH DIFFERENTIAL/PLATELET
BASOS ABS: 0.1 10*3/uL (ref 0.0–0.1)
BASOS PCT: 1 %
Eosinophils Absolute: 0.8 10*3/uL — ABNORMAL HIGH (ref 0.0–0.7)
Eosinophils Relative: 14 %
HCT: 44 % (ref 36.0–46.0)
HEMOGLOBIN: 14.1 g/dL (ref 12.0–15.0)
Lymphocytes Relative: 32 %
Lymphs Abs: 1.8 10*3/uL (ref 0.7–4.0)
MCH: 28.8 pg (ref 26.0–34.0)
MCHC: 32 g/dL (ref 30.0–36.0)
MCV: 89.8 fL (ref 78.0–100.0)
MONO ABS: 0.6 10*3/uL (ref 0.1–1.0)
Monocytes Relative: 10 %
NEUTROS ABS: 2.4 10*3/uL (ref 1.7–7.7)
NEUTROS PCT: 43 %
Platelets: 213 10*3/uL (ref 150–400)
RBC: 4.9 MIL/uL (ref 3.87–5.11)
RDW: 14.4 % (ref 11.5–15.5)
WBC: 5.6 10*3/uL (ref 4.0–10.5)

## 2014-11-17 LAB — COMPREHENSIVE METABOLIC PANEL
ALBUMIN: 3.5 g/dL (ref 3.5–5.0)
ALT: 22 U/L (ref 14–54)
AST: 31 U/L (ref 15–41)
Alkaline Phosphatase: 73 U/L (ref 38–126)
Anion gap: 6 (ref 5–15)
BILIRUBIN TOTAL: 0.3 mg/dL (ref 0.3–1.2)
BUN: 15 mg/dL (ref 6–20)
CO2: 27 mmol/L (ref 22–32)
Calcium: 9.2 mg/dL (ref 8.9–10.3)
Chloride: 109 mmol/L (ref 101–111)
Creatinine, Ser: 0.95 mg/dL (ref 0.44–1.00)
GFR calc Af Amer: >60 mL/min (ref >60)
GFR calc non Af Amer: 59 mL/min — ABNORMAL LOW (ref >60)
GLUCOSE: 91 mg/dL (ref 65–99)
POTASSIUM: 4.1 mmol/L (ref 3.5–5.1)
Sodium: 142 mmol/L (ref 135–145)
TOTAL PROTEIN: 6.2 g/dL — AB (ref 6.5–8.1)

## 2014-11-18 ENCOUNTER — Other Ambulatory Visit: Payer: Self-pay | Admitting: Plastic Surgery

## 2014-11-18 DIAGNOSIS — C50912 Malignant neoplasm of unspecified site of left female breast: Secondary | ICD-10-CM

## 2014-11-19 NOTE — H&P (Signed)
Cyril Loosen. Gruen  Location: [facility] Patient #: [id number] DOB: 01/14/1941 Married / Language: English / Race: White Female  History of Present Illness   Patient words: left breast cancer.   The patient is a 74 year old female who presents with breast cancer.   Her PCP is Dr. Emily Filbert [facility])  She is accompanied by her daughter, Sandoval Lions, and her husband.  They had a lot of questions and I spent over an hour with her and her family.   She gets annual mammograms and these have been negative. She had one biopsy of her right breast in the 1990s. This was done stereotactically and was benign. She has an aunt (mother's sister) who had breast cancer and is now didn't. Her last menstrual period was around the age of 47. She took hormones briefly.  She went for her annual mamography which prompted further follow up. The patient had mammograms and a biopsy on 10 September 2014. The mammograms showed a 1.5 x 1.4 cm mass surrounded by microcalcifications to an area 2.0 x 4.3 cm in the 2 o'clock position of the left breast. Her path report - [id number] 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   I discussed the options for breast cancer treatment with the patient. I discussed a multidisciplinary approach to the treatment of breast cancer, which includes medical oncology and radiation oncology. I discussed the surgical options of lumpectomy vs. mastectomy. If mastectomy, there is the possibility of reconstruction. I discussed the options of lymph node biopsy. The treatment plan depends on the pathologic staging of the tumor and the patient's personal wishes.  The risks of surgery include, but are not limited to, bleeding, infection, the need for further surgery, and nerve injury. The patient has been given literature on the treatment of breast cancer.  Plan: 1) MRI to better define the tumor  and size, 2) med and rad onc consults (Ennever and Maynard), 3) decide on surgery. She is leaning towards mastectomy. I also discussed reconstruction and mentioned that I would want them to see a plastic surgeon first, if she wanted reconstruction.   The husband has been treated for lymphoma (I think) and they know Drs. Ennever and [employer].  Past Medical History: 1. Seizure at 11 th grade - went to [facility] - w/u negative. No further problems 2. Righ knee replacement - Alucio - Mar 2014 She is having trouble with the left 3. GB - 1990's - P. Young 4. Colonoscopy - recent by Dr. Kalman Shan 5. Kidney stone - 2001 - lithotripsy - [facility]  Social History: Married  Has one living Wallace, Andover Lions, whom I did breast surgery in 28-Mar-2014. They had a son, but he died of Ewing's sarcoma as an early teenager.  Other Problems Ventura Sellers, [place]; 09/18/2014 8:54 AM) Arthritis Back Pain Breast Cancer Lump In Breast  Past Surgical History Ventura Sellers, CMA; 09/18/2014 8:54 AM) Breast Biopsy Right. Colon Polyp Removal - Colonoscopy Gallbladder Surgery - Laparoscopic Knee Surgery Right.  Diagnostic Studies History Ventura Sellers, [place]; 09/18/2014 8:54 AM) Colonoscopy 1-5 years ago Mammogram within last year Pap Smear 1-5 years ago  Allergies Ventura Sellers, CMA; 09/18/2014 8:54 AM) Codeine Phosphate *ANALGESICS - OPIOID*  Medication History Ventura Sellers, CMA; 09/18/2014 8:57 AM) Aleve (220MG Capsule, Oral) Active. Vitamin B-6 (100MG Tablet, Oral) Active. Vitamin D3 (2000UNIT Tablet, Oral) Active. Vitamin C (1000MG Tablet, Oral) Active. Glucosamine Chondr 1500 Complx (Oral) Active. Turmeric (  500MG Capsule, Oral) Active. Calcium Carbonate (600MG Tablet, Oral) Active. Aspirin (81MG Tablet DR, Oral) Active. Multi Vitamin Daily (Oral) Active. Multi Minerals-Amino Acids (Oral) Active. Medications Reconciled  Social History Ventura Sellers, [place]; 09/18/2014 8:54 AM) No alcohol use No caffeine use No drug use Tobacco use Never smoker.  Family History Ventura Sellers, [place]; 09/18/2014 8:54 AM) Arthritis Mother. Cancer Father. Cerebrovascular Accident Father. Colon Polyps Brother, Mother. Heart Disease Father. Heart disease in female family member before age 68 Hypertension Father, Mother. Respiratory Condition Father. Thyroid problems Brother, Mother.  Pregnancy / Birth History Ventura Sellers, [place]; 09/18/2014 8:54 AM) Age at menarche 58 years. Age of menopause 66-55 Gravida 2 Maternal age 79-30 Para 2  Review of Systems ([place]. Brooks CMA; 09/18/2014 8:54 AM) General Not Present- Appetite Loss, Chills, Fatigue, Fever, Night Sweats, Weight Gain and Weight Loss. Skin Not Present- Change in Wart/Mole, Dryness, Hives, Jaundice, New Lesions, Non-Healing Wounds, Rash and Ulcer. HEENT Present- Ringing in the Ears and Wears glasses/contact lenses. Not Present- Earache, Hearing Loss, Hoarseness, Nose Bleed, Oral Ulcers, Seasonal Allergies, Sinus Pain, Sore Throat, Visual Disturbances and Yellow Eyes. Respiratory Present- Snoring. Not Present- Bloody sputum, Chronic Cough, Difficulty Breathing and Wheezing. Breast Present- Breast Mass. Not Present- Breast Pain, Nipple Discharge and Skin Changes. Cardiovascular Not Present- Chest Pain, Difficulty Breathing Lying Down, Leg Cramps, Palpitations, Rapid Heart Rate, Shortness of Breath and Swelling of Extremities. Gastrointestinal Not Present- Abdominal Pain, Bloating, Bloody Stool, Change in Bowel Habits, Chronic diarrhea, Constipation, Difficulty Swallowing, Excessive gas, Gets full quickly at meals, Hemorrhoids, Indigestion, Nausea, Rectal Pain and Vomiting. Female Genitourinary Not Present- Frequency, Nocturia, Painful Urination, Pelvic Pain and Urgency. Musculoskeletal Present- Back Pain. Not Present- Joint Pain, Joint Stiffness, Muscle Pain, Muscle  Weakness and Swelling of Extremities. Neurological Not Present- Decreased Memory, Fainting, Headaches, Numbness, Seizures, Tingling, Tremor, Trouble walking and Weakness. Psychiatric Not Present- Anxiety, Bipolar, Change in Sleep Pattern, Depression, Fearful and Frequent crying. Endocrine Not Present- Cold Intolerance, Excessive Hunger, Hair Changes, Heat Intolerance, Hot flashes and New Diabetes. Hematology Not Present- Easy Bruising, Excessive bleeding, Gland problems, HIV and Persistent Infections.   Vitals [employer] R. Brooks CMA; 09/18/2014 8:53 AM) 09/18/2014 8:53 AM Weight: 237.25 lb Height: 64in Body Surface Area: 2.2 m Body Mass Index: 40.72 kg/m Temp.: 39F(Oral)  BP: 136/84 (Sitting, Left Arm, Standard)   Physical Exam  General: WN older WF alert and generally healthy appearing. HEENT: Normal. Pupils equal.  Neck: Supple. No mass. No thyroid mass. Lymph Nodes: No supraclavicular, cervical or axillary nodes.  Lungs: Clear to auscultation and symmetric breath sounds. Heart: RRR. No murmur or rub.  Breast: Right - no mass or lesion  Left - bruise and scar at about the 3 o'clock position  Abdomen: Soft. No mass. No tenderness. No hernia. Normal bowel sounds. Scars from lap chole and lower midline scar from TL. Rectal: Not done.  Extremities: Good strength and ROM in upper and lower extremities.  Neurologic: Grossly intact to motor and sensory function. Psychiatric: Has normal mood and affect. Behavior is normal.  Assessment & Plan  1.  BREAST CANCER, STAGE 1, Left (174.9  C50.911)  Story: Mammograms showed a 1.5 x 1.4 cm mass surrounded by microcalcifications to an area 2.0 x 4.3 cm in the 2 o'clock position of the left breast.   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 Impression: Plan   1. Medical onc and radiation onc consults. The family knows Drs.  Ennever and [employer].   2. MRI  to further evaluate extent of disease.   The MRI she had on 09/21/2014 showed a second 0.6 cm mass about 3 cm anterior to the known cancer. I reviewed the findings with the patient and her husband. She has decided on a mastectomy. She wants to see Dr. Bernadette Hoit for plastic surgery to consider immediate reconstruction.   3.   She has seen Dr. Migdalia Dk (11/04/2014) for immediate reconstruction. She is gong to get a tissue expander.  2.  Seizure in 11 th grade - went to [facility] - w/u negative. No further problems 3. Righ knee replacement - Alucio - Mar 2014  She is having trouble with the left 4. GB - 1990's - [facility]. [facility] 5. Colonoscopy - recent by Dr. Kalman Shan 5. Kidney stone - 2001 - lithotripsy - Enis Slipper, MD, [facility] Pager: [phone] Office phone:  [phone]

## 2014-11-20 ENCOUNTER — Ambulatory Visit (HOSPITAL_BASED_OUTPATIENT_CLINIC_OR_DEPARTMENT_OTHER)
Admission: RE | Admit: 2014-11-20 | Discharge: 2014-11-21 | Disposition: A | Discharge status: Home / Self Care | Payer: Medicare Other | Source: Ambulatory Visit | Attending: Plastic Surgery | Admitting: Plastic Surgery

## 2014-11-20 ENCOUNTER — Encounter (HOSPITAL_BASED_OUTPATIENT_CLINIC_OR_DEPARTMENT_OTHER)
Admission: RE | Disposition: A | Discharge status: Home / Self Care | Payer: Self-pay | Source: Ambulatory Visit | Attending: Plastic Surgery

## 2014-11-20 ENCOUNTER — Encounter (HOSPITAL_COMMUNITY)
Admission: RE | Admit: 2014-11-20 | Discharge: 2014-11-20 | Disposition: A | Discharge status: Home / Self Care | Payer: Medicare Other | Source: Ambulatory Visit | Attending: Surgery | Admitting: Surgery

## 2014-11-20 ENCOUNTER — Ambulatory Visit (HOSPITAL_BASED_OUTPATIENT_CLINIC_OR_DEPARTMENT_OTHER): Payer: Medicare Other | Admitting: Anesthesiology

## 2014-11-20 ENCOUNTER — Encounter (HOSPITAL_BASED_OUTPATIENT_CLINIC_OR_DEPARTMENT_OTHER): Payer: Self-pay | Admitting: *Deleted

## 2014-11-20 DIAGNOSIS — C50912 Malignant neoplasm of unspecified site of left female breast: Secondary | ICD-10-CM

## 2014-11-20 DIAGNOSIS — Z791 Long term (current) use of non-steroidal anti-inflammatories (NSAID): Secondary | ICD-10-CM | POA: Insufficient documentation

## 2014-11-20 DIAGNOSIS — C773 Secondary and unspecified malignant neoplasm of axilla and upper limb lymph nodes: Secondary | ICD-10-CM | POA: Diagnosis not present

## 2014-11-20 DIAGNOSIS — Z170 Estrogen receptor positive status [ER+]: Secondary | ICD-10-CM | POA: Insufficient documentation

## 2014-11-20 DIAGNOSIS — N951 Menopausal and female climacteric states: Secondary | ICD-10-CM | POA: Insufficient documentation

## 2014-11-20 DIAGNOSIS — Z7982 Long term (current) use of aspirin: Secondary | ICD-10-CM | POA: Insufficient documentation

## 2014-11-20 DIAGNOSIS — M1990 Unspecified osteoarthritis, unspecified site: Secondary | ICD-10-CM | POA: Insufficient documentation

## 2014-11-20 DIAGNOSIS — C50412 Malignant neoplasm of upper-outer quadrant of left female breast: Secondary | ICD-10-CM | POA: Diagnosis not present

## 2014-11-20 DIAGNOSIS — Z803 Family history of malignant neoplasm of breast: Secondary | ICD-10-CM | POA: Diagnosis not present

## 2014-11-20 DIAGNOSIS — Z79899 Other long term (current) drug therapy: Secondary | ICD-10-CM | POA: Insufficient documentation

## 2014-11-20 DIAGNOSIS — C50919 Malignant neoplasm of unspecified site of unspecified female breast: Secondary | ICD-10-CM | POA: Diagnosis present

## 2014-11-20 DIAGNOSIS — N200 Calculus of kidney: Secondary | ICD-10-CM | POA: Insufficient documentation

## 2014-11-20 HISTORY — PX: BREAST RECONSTRUCTION WITH PLACEMENT OF TISSUE EXPANDER AND FLEX HD (ACELLULAR HYDRATED DERMIS): SHX6295

## 2014-11-20 HISTORY — PX: MASTECTOMY W/ SENTINEL NODE BIOPSY: SHX2001

## 2014-11-20 SURGERY — MASTECTOMY WITH SENTINEL LYMPH NODE BIOPSY
Anesthesia: General | Site: Breast | Laterality: Left

## 2014-11-20 MED ORDER — SODIUM CHLORIDE 0.9 % IJ SOLN
INTRAMUSCULAR | Status: AC
  Filled 2014-11-20: qty 10

## 2014-11-20 MED ORDER — CEFAZOLIN SODIUM-DEXTROSE 2-3 GM-% IV SOLR
INTRAVENOUS | Status: AC
  Filled 2014-11-20: qty 50

## 2014-11-20 MED ORDER — SCOPOLAMINE 1 MG/3DAYS TD PT72: 1.0000 | MEDICATED_PATCH | Freq: Once | TRANSDERMAL | Status: DC | PRN

## 2014-11-20 MED ORDER — EPHEDRINE SULFATE 50 MG/ML IJ SOLN
INTRAMUSCULAR | Status: DC | PRN
  Administered 2014-11-20 (×2): 10 mg via INTRAVENOUS

## 2014-11-20 MED ORDER — CEFAZOLIN SODIUM-DEXTROSE 2-3 GM-% IV SOLR
2.0000 g | INTRAVENOUS | Status: AC
  Administered 2014-11-20: 2 g via INTRAVENOUS

## 2014-11-20 MED ORDER — PHENYLEPHRINE HCL 10 MG/ML IJ SOLN
INTRAMUSCULAR | Status: DC | PRN
  Administered 2014-11-20: 40 ug via INTRAVENOUS

## 2014-11-20 MED ORDER — LIDOCAINE HCL (CARDIAC) 20 MG/ML IV SOLN
INTRAVENOUS | Status: AC
Start: 2014-11-20 — End: 2014-11-20
  Filled 2014-11-20: qty 5

## 2014-11-20 MED ORDER — FENTANYL CITRATE (PF) 100 MCG/2ML IJ SOLN: 25.0000 ug | INTRAMUSCULAR | Status: DC | PRN

## 2014-11-20 MED ORDER — DIAZEPAM 2 MG PO TABS: 2.0000 mg | ORAL_TABLET | Freq: Two times a day (BID) | ORAL | Status: DC | PRN

## 2014-11-20 MED ORDER — FENTANYL CITRATE (PF) 100 MCG/2ML IJ SOLN
INTRAMUSCULAR | Status: AC
  Filled 2014-11-20: qty 2

## 2014-11-20 MED ORDER — SUCCINYLCHOLINE CHLORIDE 20 MG/ML IJ SOLN
INTRAMUSCULAR | Status: AC
  Filled 2014-11-20: qty 1

## 2014-11-20 MED ORDER — BUPIVACAINE HCL (PF) 0.25 % IJ SOLN
INTRAMUSCULAR | Status: AC
  Filled 2014-11-20: qty 30

## 2014-11-20 MED ORDER — NAPROXEN 500 MG PO TABS
500.0000 mg | ORAL_TABLET | Freq: Two times a day (BID) | ORAL | Status: DC | PRN
  Filled 2014-11-20: qty 1

## 2014-11-20 MED ORDER — PROPOFOL 10 MG/ML IV BOLUS
INTRAVENOUS | Status: DC | PRN
  Administered 2014-11-20: 150 mg via INTRAVENOUS

## 2014-11-20 MED ORDER — ONDANSETRON HCL 4 MG/2ML IJ SOLN
INTRAMUSCULAR | Status: DC | PRN
  Administered 2014-11-20: 4 mg via INTRAVENOUS

## 2014-11-20 MED ORDER — SENNA 8.6 MG PO TABS: 1.0000 | ORAL_TABLET | Freq: Two times a day (BID) | ORAL | Status: DC

## 2014-11-20 MED ORDER — PHENYLEPHRINE HCL 10 MG/ML IJ SOLN
INTRAMUSCULAR | Status: AC
  Filled 2014-11-20: qty 1

## 2014-11-20 MED ORDER — ONDANSETRON 4 MG PO TBDP: 4.0000 mg | ORAL_TABLET | Freq: Four times a day (QID) | ORAL | Status: DC | PRN

## 2014-11-20 MED ORDER — ONDANSETRON HCL 4 MG/2ML IJ SOLN
INTRAMUSCULAR | Status: AC
  Filled 2014-11-20: qty 2

## 2014-11-20 MED ORDER — PHENYLEPHRINE 40 MCG/ML (10ML) SYRINGE FOR IV PUSH (FOR BLOOD PRESSURE SUPPORT)
PREFILLED_SYRINGE | INTRAVENOUS | Status: AC
  Filled 2014-11-20: qty 10

## 2014-11-20 MED ORDER — METHYLENE BLUE 1 % INJ SOLN
INTRAMUSCULAR | Status: AC
  Filled 2014-11-20: qty 10

## 2014-11-20 MED ORDER — MIDAZOLAM HCL 2 MG/2ML IJ SOLN
INTRAMUSCULAR | Status: AC
  Filled 2014-11-20: qty 4

## 2014-11-20 MED ORDER — LIDOCAINE HCL (CARDIAC) 20 MG/ML IV SOLN
INTRAVENOUS | Status: DC | PRN
  Administered 2014-11-20: 50 mg via INTRAVENOUS

## 2014-11-20 MED ORDER — LIDOCAINE-EPINEPHRINE 1 %-1:100000 IJ SOLN
INTRAMUSCULAR | Status: AC
  Filled 2014-11-20: qty 1

## 2014-11-20 MED ORDER — INSULIN ASPART 100 UNIT/ML ~~LOC~~ SOLN
0.0000 [IU] | Freq: Every day | SUBCUTANEOUS | Status: DC
Start: 2014-11-20 — End: 2014-11-21

## 2014-11-20 MED ORDER — SUCCINYLCHOLINE CHLORIDE 20 MG/ML IJ SOLN
INTRAMUSCULAR | Status: DC | PRN
  Administered 2014-11-20: 120 mg via INTRAVENOUS

## 2014-11-20 MED ORDER — MIDAZOLAM HCL 2 MG/2ML IJ SOLN
INTRAMUSCULAR | Status: AC
  Filled 2014-11-20: qty 2

## 2014-11-20 MED ORDER — FENTANYL CITRATE (PF) 100 MCG/2ML IJ SOLN
50.0000 ug | INTRAMUSCULAR | Status: DC | PRN
  Administered 2014-11-20 (×2): 50 ug via INTRAVENOUS

## 2014-11-20 MED ORDER — PHENYLEPHRINE HCL 10 MG/ML IJ SOLN
10.0000 mg | INTRAVENOUS | Status: DC | PRN
  Administered 2014-11-20: 40 ug/min via INTRAVENOUS

## 2014-11-20 MED ORDER — DIPHENHYDRAMINE HCL 50 MG/ML IJ SOLN: 12.5000 mg | Freq: Four times a day (QID) | INTRAMUSCULAR | Status: DC | PRN

## 2014-11-20 MED ORDER — DEXAMETHASONE SODIUM PHOSPHATE 4 MG/ML IJ SOLN
INTRAMUSCULAR | Status: DC | PRN
  Administered 2014-11-20: 10 mg via INTRAVENOUS

## 2014-11-20 MED ORDER — CHLORHEXIDINE GLUCONATE 4 % EX LIQD: 1.0000 "application " | Freq: Once | CUTANEOUS | Status: DC

## 2014-11-20 MED ORDER — GLYCOPYRROLATE 0.2 MG/ML IJ SOLN: 0.2000 mg | Freq: Once | INTRAMUSCULAR | Status: DC | PRN

## 2014-11-20 MED ORDER — DICLOFENAC SODIUM 1 % TD GEL
2.0000 g | Freq: Four times a day (QID) | TRANSDERMAL | Status: DC
  Administered 2014-11-20 – 2014-11-21 (×4): 2 g via TOPICAL
  Filled 2014-11-20: qty 100

## 2014-11-20 MED ORDER — DIPHENHYDRAMINE HCL 12.5 MG/5ML PO ELIX: 12.5000 mg | ORAL_SOLUTION | Freq: Four times a day (QID) | ORAL | Status: DC | PRN

## 2014-11-20 MED ORDER — ONDANSETRON HCL 4 MG/2ML IJ SOLN: 4.0000 mg | Freq: Four times a day (QID) | INTRAMUSCULAR | Status: DC | PRN

## 2014-11-20 MED ORDER — HYDROCODONE-ACETAMINOPHEN 5-325 MG PO TABS: 1.0000 | ORAL_TABLET | ORAL | Status: DC | PRN

## 2014-11-20 MED ORDER — SODIUM CHLORIDE 0.9 % IR SOLN
Status: DC | PRN
  Administered 2014-11-20: 500 mL

## 2014-11-20 MED ORDER — ACETAMINOPHEN 500 MG PO TABS
1000.0000 mg | ORAL_TABLET | Freq: Four times a day (QID) | ORAL | Status: DC
  Administered 2014-11-20 – 2014-11-21 (×4): 1000 mg via ORAL
  Filled 2014-11-20 (×4): qty 2

## 2014-11-20 MED ORDER — TECHNETIUM TC 99M SULFUR COLLOID FILTERED
1.0000 | Freq: Once | INTRAVENOUS | Status: AC | PRN
  Administered 2014-11-20: 1 via INTRADERMAL

## 2014-11-20 MED ORDER — LACTATED RINGERS IV SOLN
INTRAVENOUS | Status: DC
  Administered 2014-11-20: 07:00:00 via INTRAVENOUS

## 2014-11-20 MED ORDER — MIDAZOLAM HCL 2 MG/2ML IJ SOLN
1.0000 mg | INTRAMUSCULAR | Status: DC | PRN
  Administered 2014-11-20: 1.5 mg via INTRAVENOUS
  Administered 2014-11-20: 1 mg via INTRAVENOUS

## 2014-11-20 MED ORDER — LIDOCAINE HCL (CARDIAC) 20 MG/ML IV SOLN
INTRAVENOUS | Status: AC
  Filled 2014-11-20: qty 5

## 2014-11-20 MED ORDER — KCL IN DEXTROSE-NACL 20-5-0.45 MEQ/L-%-% IV SOLN
INTRAVENOUS | Status: DC
  Administered 2014-11-20: 12:00:00 via INTRAVENOUS
  Filled 2014-11-20: qty 1000

## 2014-11-20 MED ORDER — CEFAZOLIN SODIUM-DEXTROSE 2-3 GM-% IV SOLR
2.0000 g | Freq: Three times a day (TID) | INTRAVENOUS | Status: DC
  Administered 2014-11-20 – 2014-11-21 (×3): 2 g via INTRAVENOUS
  Filled 2014-11-20 (×3): qty 50

## 2014-11-20 MED ORDER — EPHEDRINE SULFATE 50 MG/ML IJ SOLN
INTRAMUSCULAR | Status: AC
  Filled 2014-11-20: qty 1

## 2014-11-20 MED ORDER — MORPHINE SULFATE (PF) 2 MG/ML IV SOLN
2.0000 mg | INTRAVENOUS | Status: DC | PRN
Start: 2014-11-20 — End: 2014-11-21

## 2014-11-20 MED ORDER — INSULIN ASPART 100 UNIT/ML ~~LOC~~ SOLN: 0.0000 [IU] | Freq: Three times a day (TID) | SUBCUTANEOUS | Status: DC

## 2014-11-20 MED ORDER — FENTANYL CITRATE (PF) 100 MCG/2ML IJ SOLN
INTRAMUSCULAR | Status: AC
  Filled 2014-11-20: qty 4

## 2014-11-20 SURGICAL SUPPLY — 99 items
APL SKNCLS STERI-STRIP NONHPOA (GAUZE/BANDAGES/DRESSINGS)
APPLIER CLIP 11 MED OPEN (CLIP)
APPLIER CLIP 9.375 MED OPEN (MISCELLANEOUS)
APR CLP MED 11 20 MLT OPN (CLIP)
APR CLP MED 9.3 20 MLT OPN (MISCELLANEOUS)
BAG DECANTER FOR FLEXI CONT (MISCELLANEOUS) ×4 IMPLANT
BANDAGE ELASTIC 6 VELCRO ST LF (GAUZE/BANDAGES/DRESSINGS) IMPLANT
BENZOIN TINCTURE PRP APPL 2/3 (GAUZE/BANDAGES/DRESSINGS) IMPLANT
BINDER BREAST LRG (GAUZE/BANDAGES/DRESSINGS) IMPLANT
BINDER BREAST MEDIUM (GAUZE/BANDAGES/DRESSINGS) IMPLANT
BINDER BREAST XLRG (GAUZE/BANDAGES/DRESSINGS) IMPLANT
BINDER BREAST XXLRG (GAUZE/BANDAGES/DRESSINGS) ×2 IMPLANT
BIOPATCH RED 1 DISK 7.0 (GAUZE/BANDAGES/DRESSINGS) ×3 IMPLANT
BIOPATCH RED 1IN DISK 7.0MM (GAUZE/BANDAGES/DRESSINGS) ×1
BLADE CLIPPER SURG (BLADE) IMPLANT
BLADE HEX COATED 2.75 (ELECTRODE) ×4 IMPLANT
BLADE SURG 10 STRL SS (BLADE) ×4 IMPLANT
BLADE SURG 15 STRL LF DISP TIS (BLADE) ×4 IMPLANT
BLADE SURG 15 STRL SS (BLADE) ×12
BNDG GAUZE ELAST 4 BULKY (GAUZE/BANDAGES/DRESSINGS) ×4 IMPLANT
CANISTER SUCT 1200ML W/VALVE (MISCELLANEOUS) ×8 IMPLANT
CHLORAPREP W/TINT 26ML (MISCELLANEOUS) ×8 IMPLANT
CLIP APPLIE 11 MED OPEN (CLIP) IMPLANT
CLIP APPLIE 9.375 MED OPEN (MISCELLANEOUS) IMPLANT
CLOSURE WOUND 1/2 X4 (GAUZE/BANDAGES/DRESSINGS)
COVER BACK TABLE 60X90IN (DRAPES) ×8 IMPLANT
COVER MAYO STAND STRL (DRAPES) ×8 IMPLANT
COVER PROBE W GEL 5X96 (DRAPES) ×4 IMPLANT
DECANTER SPIKE VIAL GLASS SM (MISCELLANEOUS) IMPLANT
DRAIN CHANNEL 19F RND (DRAIN) ×6 IMPLANT
DRAPE LAPAROSCOPIC ABDOMINAL (DRAPES) ×8 IMPLANT
DRAPE UTILITY XL STRL (DRAPES) ×4 IMPLANT
DRSG PAD ABDOMINAL 8X10 ST (GAUZE/BANDAGES/DRESSINGS) ×8 IMPLANT
ELECT BLADE 4.0 EZ CLEAN MEGAD (MISCELLANEOUS) ×4
ELECT COATED BLADE 2.86 ST (ELECTRODE) ×4 IMPLANT
ELECT REM PT RETURN 9FT ADLT (ELECTROSURGICAL) ×8
ELECTRODE BLDE 4.0 EZ CLN MEGD (MISCELLANEOUS) ×2 IMPLANT
ELECTRODE REM PT RTRN 9FT ADLT (ELECTROSURGICAL) ×4 IMPLANT
EVACUATOR SILICONE 100CC (DRAIN) ×6 IMPLANT
FILTER 7/8 IN (FILTER) ×4 IMPLANT
GAUZE SPONGE 4X4 12PLY STRL (GAUZE/BANDAGES/DRESSINGS) ×4 IMPLANT
GLOVE BIO SURGEON STRL SZ 6.5 (GLOVE) ×7 IMPLANT
GLOVE BIO SURGEONS STRL SZ 6.5 (GLOVE) ×3
GLOVE BIOGEL PI IND STRL 7.0 (GLOVE) IMPLANT
GLOVE BIOGEL PI IND STRL 7.5 (GLOVE) IMPLANT
GLOVE BIOGEL PI INDICATOR 7.0 (GLOVE) ×2
GLOVE BIOGEL PI INDICATOR 7.5 (GLOVE) ×2
GLOVE SURG SIGNA 7.5 PF LTX (GLOVE) ×4 IMPLANT
GLOVE SURG SS PI 7.5 STRL IVOR (GLOVE) ×2 IMPLANT
GOWN STRL REUS W/ TWL LRG LVL3 (GOWN DISPOSABLE) ×10 IMPLANT
GOWN STRL REUS W/TWL LRG LVL3 (GOWN DISPOSABLE) ×20
GRAFT FLEX HD 4X16 THICK (Tissue Mesh) ×2 IMPLANT
IMPL BREAST TIS EXP M 350CC (Breast) IMPLANT
IMPLANT BREAST TIS EXP M 350CC (Breast) ×4 IMPLANT
IV NS 1000ML (IV SOLUTION)
IV NS 1000ML BAXH (IV SOLUTION) IMPLANT
IV NS 500ML (IV SOLUTION) ×4
IV NS 500ML BAXH (IV SOLUTION) ×2 IMPLANT
KIT FILL SYSTEM UNIVERSAL (SET/KITS/TRAYS/PACK) ×2 IMPLANT
KIT MARKER MARGIN INK (KITS) IMPLANT
LIQUID BAND (GAUZE/BANDAGES/DRESSINGS) ×6 IMPLANT
NDL HYPO 25X1 1.5 SAFETY (NEEDLE) ×2 IMPLANT
NDL SAFETY ECLIPSE 18X1.5 (NEEDLE) IMPLANT
NEEDLE HYPO 18GX1.5 SHARP (NEEDLE) ×4
NEEDLE HYPO 25X1 1.5 SAFETY (NEEDLE) ×4 IMPLANT
NS IRRIG 1000ML POUR BTL (IV SOLUTION) ×4 IMPLANT
PACK BASIN DAY SURGERY FS (CUSTOM PROCEDURE TRAY) ×8 IMPLANT
PENCIL BUTTON HOLSTER BLD 10FT (ELECTRODE) ×4 IMPLANT
PIN SAFETY STERILE (MISCELLANEOUS) ×6 IMPLANT
SET ASEPTIC TRANSFER (MISCELLANEOUS) ×4 IMPLANT
SHEET MEDIUM DRAPE 40X70 STRL (DRAPES) ×4 IMPLANT
SLEEVE SCD COMPRESS KNEE MED (MISCELLANEOUS) ×8 IMPLANT
SPONGE GAUZE 4X4 12PLY STER LF (GAUZE/BANDAGES/DRESSINGS) ×2 IMPLANT
SPONGE LAP 18X18 X RAY DECT (DISPOSABLE) ×12 IMPLANT
SPONGE LAP 4X18 X RAY DECT (DISPOSABLE) IMPLANT
STAPLER VISISTAT 35W (STAPLE) ×2 IMPLANT
STRIP CLOSURE SKIN 1/2X4 (GAUZE/BANDAGES/DRESSINGS) IMPLANT
SUT ETHILON 2 0 FS 18 (SUTURE) ×4 IMPLANT
SUT MNCRL AB 4-0 PS2 18 (SUTURE) ×8 IMPLANT
SUT MON AB 3-0 SH 27 (SUTURE) ×4
SUT MON AB 3-0 SH27 (SUTURE) ×2 IMPLANT
SUT MON AB 5-0 PS2 18 (SUTURE) ×4 IMPLANT
SUT PDS 3-0 CT2 (SUTURE)
SUT PDS AB 2-0 CT2 27 (SUTURE) ×2 IMPLANT
SUT PDS II 3-0 CT2 27 ABS (SUTURE) IMPLANT
SUT SILK 2 0 SH (SUTURE) ×4 IMPLANT
SUT SILK 3 0 PS 1 (SUTURE) ×4 IMPLANT
SUT VIC AB 3-0 SH 27 (SUTURE) ×4
SUT VIC AB 3-0 SH 27X BRD (SUTURE) ×2 IMPLANT
SUT VICRYL 3-0 CR8 SH (SUTURE) ×6 IMPLANT
SYR BULB IRRIGATION 50ML (SYRINGE) ×4 IMPLANT
SYR CONTROL 10ML LL (SYRINGE) ×4 IMPLANT
TOWEL OR 17X24 6PK STRL BLUE (TOWEL DISPOSABLE) ×12 IMPLANT
TOWEL OR NON WOVEN STRL DISP B (DISPOSABLE) ×4 IMPLANT
TUBE CONNECTING 20'X1/4 (TUBING) ×2
TUBE CONNECTING 20X1/4 (TUBING) ×6 IMPLANT
UNDERPAD 30X30 (UNDERPADS AND DIAPERS) ×8 IMPLANT
VAC PENCILS W/TUBING CLEAR (MISCELLANEOUS) ×4 IMPLANT
YANKAUER SUCT BULB TIP NO VENT (SUCTIONS) ×8 IMPLANT

## 2014-11-20 NOTE — Transfer of Care (Signed)
Immediate Anesthesia Transfer of Care Note  Patient: Kimberly [name]  Procedure(s) Performed: Procedure(s): MASTECTOMY WITH SENTINEL LYMPH NODE BIOPSY (Left) LEFT BREAST RECONSTRUCTION WITH PLACEMENT OF TISSUE EXPANDER AND FLEX HD (ACELLULAR HYDRATED DERMIS)  (Left)  Patient Location: PACU  Anesthesia Type:GA combined with regional for post-op pain  Level of Consciousness: awake, alert  and oriented  Airway & Oxygen Therapy: Patient Spontanous Breathing and Patient connected to face mask oxygen  Post-op Assessment: Report given to RN and Post -op Vital signs reviewed and stable  Post vital signs: Reviewed and stable  Last Vitals:  Filed Vitals:   11/20/14 0730  BP: 100/51  Pulse: 75  Temp:   Resp: 13    Complications: No apparent anesthesia complications

## 2014-11-20 NOTE — H&P (Signed)
Kimberly [name] is an 74 y.o. female.   Chief Complaint: left breast cancer HPI: The patient is a 74 yrs old wf here with her daughter for history and physical for left mastectomy immediate reconstruction with expander and ADM. She is 5 feet 4 inches tall and weighs 230 pounds. She wears a 57 D and would like to be around the same size or smaller. 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 She has a history of right knee replacement, kidney stones, colonoscopy and cholecystectomy. An MRI was clear on the right and an additional site seen on the left. At this time she is wanting to have a left mastectomy with reconstruction and then surgery to have the right side match.  Past Medical History  Diagnosis Date  . Arthritis   . Seizures     hx of one seizure in 1963--no known reason for the seizure and none since  . DDD (degenerative disc disease), lumbar   . Breast cancer 09/10/14    Right Breast    Past Surgical History  Procedure Laterality Date  . Cholecystectomy    . Lithotripsy    . Tubal ligation    . Breast surgery  1997    RIGHT BREAST BIOPSY  . Total knee arthroplasty Right 05/14/2012    Procedure: TOTAL KNEE ARTHROPLASTY;  Surgeon: Gearlean Alf, MD;  Location: [facility];  Service: Orthopedics;  Laterality: Right;    Family History  Problem Relation Age of Onset  . Breast cancer Maternal Aunt    Social History:  reports that she has never smoked. She has never used smokeless tobacco. She reports that she does not drink alcohol or use illicit drugs.  Allergies:  Allergies  Allergen Reactions  . Codeine Nausea And Vomiting    Medications Prior to Admission  Medication Sig Dispense Refill  . acetaminophen (TYLENOL) 650 MG CR tablet Take 1,300 mg by mouth. Takes 2 every am and 2 every pm    . loratadine (CLARITIN) 10  MG tablet Take 10 mg by mouth daily.    . Ascorbic Acid (VITAMIN C) 1000 MG tablet Take by mouth.    Marland Kitchen aspirin EC 81 MG tablet Take by mouth.    . Cholecalciferol (D 2000) 2000 UNITS TABS Take by mouth.    . Multiple Vitamin (MULTI-VITAMINS) TABS Take by mouth.    . naproxen sodium (RA NAPROXEN SODIUM) 220 MG tablet Take by mouth.    . pyridOXINE (B-6) 50 MG tablet Take by mouth.      No results found for this or any previous visit (from the past 48 hour(s)). Nm Sentinel Node Inj-no Rpt (breast)  11/20/2014   CLINICAL DATA: left breast cancer   Sulfur colloid was injected intradermally by the nuclear medicine  technologist for breast cancer sentinel node localization.     Review of Systems  Constitutional: Negative.   HENT: Negative.   Eyes: Negative.   Respiratory: Negative.   Cardiovascular: Negative.   Gastrointestinal: Negative.   Genitourinary: Negative.   Musculoskeletal: Negative.   Skin: Negative.   Neurological: Negative.   Psychiatric/Behavioral: Negative.     Blood pressure 100/51, pulse 75, temperature 97.9 F (36.6 C), temperature source Oral, resp. rate 13, height 5\' 4"  (1.626 m), weight 106.142 kg (234 lb), SpO2 100 %. Physical Exam  Constitutional: She is oriented to person, place, and time.  She appears well-developed and well-nourished.  HENT:  Head: Normocephalic and atraumatic.  Eyes: Conjunctivae and EOM are normal. Pupils are equal, round, and reactive to light.  Cardiovascular: Normal rate.   Respiratory: Effort normal.  Musculoskeletal: Normal range of motion.  Neurological: She is alert and oriented to person, place, and time.  Skin: Skin is warm.  Psychiatric: She has a normal mood and affect. Her behavior is normal. Judgment and thought content normal.     Assessment/Plan Plan for immediate left breast reconstruction with expander and FlexHD.  [name] 11/20/2014, 7:50 AM

## 2014-11-20 NOTE — Brief Op Note (Addendum)
11/20/2014  8:01 AM  PATIENT:  Kimberly [name]  74 y.o. female  PRE-OPERATIVE DIAGNOSIS:  left breast cancer  POST-OPERATIVE DIAGNOSIS:  left breast cancer  PROCEDURE:  Procedure(s): MASTECTOMY WITH SENTINEL LYMPH NODE BIOPSY (Left) LEFT BREAST RECONSTRUCTION WITH PLACEMENT OF TISSUE EXPANDER AND FLEX HD (ACELLULAR HYDRATED DERMIS)  (Left)  SURGEON:  Surgeon(s) and Role: Panel 1:    * Alphonsa Overall, MD - Primary  Panel 2:    * Loel Lofty Dillingham, DO - Primary  PHYSICIAN ASSISTANT: Shawn Rayburn, PA  ASSISTANTS: none   ANESTHESIA:   general  EBL:     BLOOD ADMINISTERED:none  DRAINS: (1) Jackson-Pratt drain(s) with closed bulb suction in the left breast pocket   LOCAL MEDICATIONS USED:  NONE  SPECIMEN:  No Specimen  DISPOSITION OF SPECIMEN:  N/A  COUNTS:  YES  TOURNIQUET:  * No tourniquets in log *  DICTATION: .Dragon Dictation  PLAN OF CARE: observation  PATIENT DISPOSITION:  PACU - hemodynamically stable.   Delay start of Pharmacological VTE agent (>24hrs) due to surgical blood loss or risk of bleeding: no

## 2014-11-20 NOTE — Progress Notes (Signed)
Assisted Dr. Finis Bud with left, ultrasound guided, pectoralis block. Side rails up, monitors on throughout procedure. See vital signs in flow sheet. Tolerated Procedure well.

## 2014-11-20 NOTE — Progress Notes (Signed)
Radiology staff performed nuc med inj. No additional sedation required. Pt tol well. Family called to bedside, emotional support provided and updated

## 2014-11-20 NOTE — Anesthesia Postprocedure Evaluation (Signed)
  Anesthesia Post-op Note  Patient: Kimberly [name]  Procedure(s) Performed: Procedure(s): MASTECTOMY WITH SENTINEL LYMPH NODE BIOPSY (Left) LEFT BREAST RECONSTRUCTION WITH PLACEMENT OF TISSUE EXPANDER AND FLEX HD (ACELLULAR HYDRATED DERMIS)  (Left)  Patient Location: PACU  Anesthesia Type:General  Level of Consciousness: awake  Airway and Oxygen Therapy: Patient Spontanous Breathing  Post-op Pain: mild  Post-op Assessment: Post-op Vital signs reviewed              Post-op Vital Signs: Reviewed  Last Vitals:  Filed Vitals:   11/20/14 1200  BP: 125/74  Pulse: 52  Temp: 36.1 C  Resp: 16    Complications: No apparent anesthesia complications

## 2014-11-20 NOTE — Op Note (Signed)
Op report    DATE OF OPERATION:  11/20/2014  LOCATION: [facility]  SURGICAL DIVISION: Plastic Surgery  PREOPERATIVE DIAGNOSES:  1. Left Breast cancer.    POSTOPERATIVE DIAGNOSES:  1. Left Breast cancer.   PROCEDURE:  1. Left immediate breast reconstruction with placement of Acellular Dermal Matrix and tissue expanders.  SURGEON: Theodoro Kos, DO  ASSISTANT: Shawn Rayburn, PA  ANESTHESIA:  General.   COMPLICATIONS: None.   IMPLANTS: Left - Mentor 350cc. Ref #[id number].  Serial Number [id number] with 350 cc of saline placed Acellular Dermal Matrix Flex HD 4 x 16 cm  INDICATIONS FOR PROCEDURE:  The patient, Kimberly [name], is a 74 y.o. female born on Mar 19, 1940, is here for  immediate first stage breast reconstruction with placement of left tissue expander and Acellular dermal matrix. MRN: [id number]  CONSENT:  Informed consent was obtained directly from the patient. Risks, benefits and alternatives were fully discussed. Specific risks including but not limited to bleeding, infection, hematoma, seroma, scarring, pain, implant infection, implant extrusion, capsular contracture, asymmetry, wound healing problems, and need for further surgery were all discussed. The patient did have an ample opportunity to have her questions answered to her satisfaction.   DESCRIPTION OF PROCEDURE:  The patient was taken to the operating room by the general surgery team. SCDs were placed and IV antibiotics were given. The patient's chest was prepped and draped in a sterile fashion. A time out was performed and the implants to be used were identified.  Left mastectomy was performed.  Once the general surgery team had completed their portion of the case the patient was rendered to the plastic and reconstructive surgery team.  The pectoralis major muscle was lifted from the chest wall with release of the lateral edge and lateral inframammary fold.  The pocket was irrigated with  antibiotic solution and hemostasis was achieved with electrocautery.  The ADM was then prepared according to the manufacture guidelines and slits placed to help with postoperative fluid management.  The ADM was then sutured to the inferior and lateral edge of the inframammary fold with 2-0 PDS starting with an interrupted stitch and then a running stitch.  The lateral portion was sutured to with interrupted sutures after the expander was placed.  The expander was prepared according to the manufacture guidelines, the air evacuated and then it was placed under the ADM and pectoralis major muscle.  The inferior and lateral tabs were used to secure the expander to the chest wall with 2-0 PDS.  The drain was placed at the inframammary fold over the ADM and secured to the skin with 3-0 Silk.    The deep layers were closed with 3-0 Vicryl followed by 4-0 Monocryl.  The skin was closed with 5-0 Monocryl and then dermabond was applied.  The ABDs and breast binder were placed.  The patient tolerated the procedure well and there were no complications.  The patient was allowed to wake from anesthesia and taken to the recovery room in satisfactory condition.

## 2014-11-20 NOTE — Discharge Instructions (Signed)
° ° °  Can go home in the morning when meeting all discharge mildstones. Continue binder at all times. Drain Care daily. No heavy lifting. Sink bath.   Post Anesthesia Home Care Instructions  Activity: Get plenty of rest for the remainder of the day. A responsible adult should stay with you for 24 hours following the procedure.  For the next 24 hours, DO NOT: -Drive a car -Paediatric nurse -Drink alcoholic beverages -Take any medication unless instructed by your physician -Make any legal decisions or sign important papers.  Meals: Start with liquid foods such as gelatin or soup. Progress to regular foods as tolerated. Avoid greasy, spicy, heavy foods. If nausea and/or vomiting occur, drink only clear liquids until the nausea and/or vomiting subsides. Call your physician if vomiting continues.  Special Instructions/Symptoms: Your throat may feel dry or sore from the anesthesia or the breathing tube placed in your throat during surgery. If this causes discomfort, gargle with warm salt water. The discomfort should disappear within 24 hours.  If you had a scopolamine patch placed behind your ear for the management of post- operative nausea and/or vomiting:  1. The medication in the patch is effective for 72 hours, after which it should be removed.  Wrap patch in a tissue and discard in the trash. Wash hands thoroughly with soap and water. 2. You may remove the patch earlier than 72 hours if you experience unpleasant side effects which may include dry mouth, dizziness or visual disturbances. 3. Avoid touching the patch. Wash your hands with soap and water after contact with the patch.      JP Drain [employer] this sheet to all of your post-operative appointments while you have your drains.  Please measure your drains by CC's or ML's.  Make sure you drain and measure your JP Drains 2 or 3 times per day.  At the end of each day, add up totals for the left side and add up totals  for the right side.     ( 9 am )     ( 3 pm )        ( 9 pm )                Date L  R  L  R  L  R  Total L/R

## 2014-11-20 NOTE — Op Note (Signed)
11/20/2014  9:24 AM  PATIENT:  Kimberly [name], 74 y.o., female, MRN: [id number]  PREOP DIAGNOSIS:  left breast cancer  POSTOP DIAGNOSIS:   Left breast cancer, 2 o'clock position (T1, N0)  PROCEDURE:   Procedure(s): MASTECTOMY WITH SENTINEL LYMPH NODE BIOPSY - Lucia Gaskins LEFT BREAST RECONSTRUCTION WITH PLACEMENT OF TISSUE EXPANDER AND FLEX HD (ACELLULAR HYDRATED DERMIS)  - [place]  SURGEON:   Alphonsa Overall, M.D.  ASSISTANT:   none  ANESTHESIA:   general  Anesthesiologist: Finis Bud, MD CRNA: Willa Frater, CRNA; Tawni Millers, CRNA  General  ASA:  2  EBL:  100  ml  BLOOD ADMINISTERED: none  DRAINS: Per Dr. Marla Roe  LOCAL MEDICATIONS USED:   Pectoral block pre op  SPECIMEN:   Left breast (long suture lateral), sentinel lymph node (counts 650, background 5)  COUNTS CORRECT:  YES  INDICATIONS FOR PROCEDURE:  Kimberly [name] is a 74 y.o. (DOB: [birth year]) white  female whose primary care physician is Rusty Aus., MD and comes for left mastectomy fro left breast cancer.   She has seen Dr. Marla Roe for breast reconstruction.  Dr. Marin Olp is her oncologist.   The indications and risks of the surgery were explained to the patient.  The risks include, but are not limited to, infection, bleeding, and nerve injury.  OPERATIVE NOTE;  The patient was taken to room # 8 at [facility] where she underwent a general anesthesia  supervised by Anesthesiologist: Finis Bud, MD CRNA: Willa Frater, CRNA; Tawni Millers, CRNA. Her left breast and axilla were prepped with  ChloraPrep and sterilely draped.    A time-out and the surgical check list was reviewed.    I made an elliptical incision including the areola in the left breast.  I developed skin flaps medially to the lateral edge of the sternum, inferiorly to the investing fascia of the rectus abdominus muscle, laterally to the anterior edge of the latissimus dorsi muscle, and superiorly to about 2 finger breaths  below the clavicle.  The breast was reflected off the pectoralis muscle from medial to lateral.  The lateral attachments in the left axilla were divided and the breast removed.  A long suture was placed on the lateral aspect of the breast.   I dissected into the left axilla and found a sentinel lymph node.  The node had counts of 650 with a background count of 5.   This was sent as a separate specimen.   I irrigated the wound with 1,000 cc of saline.  A saline sponge was placed in the wound.   At this point Dr. Marla Roe scrubbed in to start the breast reconstruction.   She will dictate this portion of the operation.   Alphonsa Overall, MD, [facility] Pager: [phone] Office phone:  [phone]

## 2014-11-20 NOTE — Anesthesia Procedure Notes (Addendum)
Procedure Name: Intubation Date/Time: 11/20/2014 7:48 AM Performed by: Melynda Ripple D Pre-anesthesia Checklist: Patient identified, Emergency Drugs available, Suction available and Patient being monitored Patient Re-evaluated:Patient Re-evaluated prior to inductionOxygen Delivery Method: Circle System Utilized Preoxygenation: Pre-oxygenation with 100% oxygen Intubation Type: IV induction Ventilation: Mask ventilation without difficulty Laryngoscope Size: Mac and 3 Grade View: Grade I Tube type: Oral Number of attempts: 1 Airway Equipment and Method: Stylet and Oral airway Placement Confirmation: ETT inserted through vocal cords under direct vision,  positive ETCO2 and breath sounds checked- equal and bilateral Secured at: 22 cm Tube secured with: Tape Dental Injury: Teeth and Oropharynx as per pre-operative assessment    Anesthesia Regional Block:  Pectoralis block  Pre-Anesthetic Checklist: ,, timeout performed, Correct Patient, Correct Site, Correct Laterality, Correct Procedure, Correct Position, site marked, Risks and benefits discussed,  Surgical consent,  Pre-op evaluation,  At surgeon's request and post-op pain management  Laterality: Left  Prep: chloraprep       Needles:   Needle Type: Stimulator Needle - 80          Additional Needles:  Procedures: Doppler guided and ultrasound guided (picture in chart) Pectoralis block Narrative:  Start time: 11/20/2014 7:00 AM End time: 11/20/2014 7:15 AM Injection made incrementally with aspirations every 5 mL.  Performed by: Personally  Anesthesiologist: Finis Bud

## 2014-11-20 NOTE — Anesthesia Preprocedure Evaluation (Addendum)
Anesthesia Evaluation  Patient identified by MRN, date of birth, ID band Patient awake    Reviewed: Allergy & Precautions, NPO status , Patient's Chart, lab work & pertinent test results  Airway Mallampati: II  TM Distance: >3 FB Neck ROM: Full    Dental   Pulmonary neg pulmonary ROS,    breath sounds clear to auscultation       Cardiovascular negative cardio ROS   Rhythm:Regular Rate:Normal     Neuro/Psych    GI/Hepatic negative GI ROS, Neg liver ROS,   Endo/Other  negative endocrine ROS  Renal/GU negative Renal ROS     Musculoskeletal   Abdominal   Peds  Hematology   Anesthesia Other Findings   Reproductive/Obstetrics                             Anesthesia Physical Anesthesia Plan  ASA: II  Anesthesia Plan: General   Post-op Pain Management: GA combined w/ Regional for post-op pain   Induction: Intravenous  Airway Management Planned: Oral ETT  Additional Equipment:   Intra-op Plan:   Post-operative Plan: Extubation in OR  Informed Consent: I have reviewed the patients History and Physical, chart, labs and discussed the procedure including the risks, benefits and alternatives for the proposed anesthesia with the patient or authorized representative who has indicated his/her understanding and acceptance.   Dental advisory given  Plan Discussed with: CRNA and Anesthesiologist  Anesthesia Plan Comments:        Anesthesia Quick Evaluation

## 2014-11-20 NOTE — Interval H&P Note (Signed)
History and Physical Interval Note:  11/20/2014 7:18 AM  Kimberly [name] [name]  has presented today for surgery, with the diagnosis of left breast cancer  The various methods of treatment have been discussed with the patient and family.   She is getting a pectoralis block.  After consideration of risks, benefits and other options for treatment, the patient has consented to  Procedure(s): MASTECTOMY WITH SENTINEL LYMPH NODE BIOPSY (Left) LEFT BREAST RECONSTRUCTION WITH PLACEMENT OF TISSUE EXPANDER AND FLEX HD (ACELLULAR HYDRATED DERMIS)  (Left) as a surgical intervention .  The patient's history has been reviewed, patient examined, no change in status, stable for surgery.  I have reviewed the patient's chart and labs.  Questions were answered to the patient's satisfaction.     NEWMAN,DAVID H

## 2014-11-21 DIAGNOSIS — C50412 Malignant neoplasm of upper-outer quadrant of left female breast: Secondary | ICD-10-CM | POA: Diagnosis not present

## 2014-11-21 NOTE — Addendum Note (Signed)
Addendum  created 11/21/14 1257 by Baxter Flattery, CRNA   Modules edited: Charges VN

## 2014-11-24 ENCOUNTER — Encounter (HOSPITAL_BASED_OUTPATIENT_CLINIC_OR_DEPARTMENT_OTHER): Payer: Self-pay | Admitting: Surgery

## 2014-12-03 ENCOUNTER — Encounter: Payer: Self-pay | Admitting: Radiation Oncology

## 2014-12-03 ENCOUNTER — Ambulatory Visit
Admission: RE | Admit: 2014-12-03 | Discharge: 2014-12-03 | Disposition: A | Discharge status: Home / Self Care | Payer: Medicare Other | Source: Ambulatory Visit | Attending: Radiation Oncology | Admitting: Radiation Oncology

## 2014-12-03 VITALS — BP 137/84 | HR 82 | Temp 98.1°F | Ht 64.0 in | Wt 236.7 lb

## 2014-12-03 DIAGNOSIS — C50412 Malignant neoplasm of upper-outer quadrant of left female breast: Secondary | ICD-10-CM

## 2014-12-03 DIAGNOSIS — R569 Unspecified convulsions: Secondary | ICD-10-CM | POA: Diagnosis not present

## 2014-12-03 DIAGNOSIS — Z510 Encounter for antineoplastic radiation therapy: Secondary | ICD-10-CM | POA: Diagnosis not present

## 2014-12-03 DIAGNOSIS — M1990 Unspecified osteoarthritis, unspecified site: Secondary | ICD-10-CM | POA: Diagnosis not present

## 2014-12-03 DIAGNOSIS — M5136 Other intervertebral disc degeneration, lumbar region: Secondary | ICD-10-CM | POA: Diagnosis not present

## 2014-12-03 DIAGNOSIS — Z170 Estrogen receptor positive status [ER+]: Secondary | ICD-10-CM | POA: Diagnosis not present

## 2014-12-03 DIAGNOSIS — C50912 Malignant neoplasm of unspecified site of left female breast: Secondary | ICD-10-CM | POA: Diagnosis present

## 2014-12-03 NOTE — Progress Notes (Signed)
[facility] NEW PATIENT EVALUATION  Name: Kimberly [name] MRN: [id number]  Date:   12/03/2014           DOB: 1941-02-09  Status: outpatient   CC: Rusty Aus., MD  Alphonsa Overall, MD , Dr. Audelia Hives, Dr. Thea Silversmith, Dr. 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 female who is seen today through the courtesy of Dr. 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  At the time of a screening to home oh mammogram on 09/02/2014 she was felt to have possible asymmetry in the left breast within the upper-outer quadrant.  Additional views showed a spiculated 1.5 x 1.4 x 1.0 cm mass within the upper-outer quadrant along with surrounding calcifications spanning an area of 2.0 x 1.5 x 4.3 cm.  Ultrasound showed a 1.0 x 0.7 x 0.6 cm mass at 2:00, 7 cm from the nipple.  Sonographic evaluation of the left axilla did not show any adenopathy.  There was nothing palpable on physical examination.  Ultrasound-guided biopsy on 09/10/2014 was diagnostic for invasive ductal carcinoma/DCIS.  The invasive disease was ER positive at 100%, PR positive at 70% with a proliferation marker/Ki-67 of 10%.  HER-2/neu was negative.  Breast MR on 09/21/2014 showed a 2.3 x 1.3 x 1.7 cm enhancing mass in the upper-outer quadrant of the middle third of the left breast.  3 cm anterior to the mass was a 6 x 4 x 6 mm nodule.  There were no abnormal appearing lymph nodes.  The right breast was normal.  She was seen by Dr. Audelia Hives as well as Dr. Lucia Gaskins and she elected for a left mastectomy, with placement of a tissue expander along with a sentinel lymph node biopsy.  Her surgery was on 11/20/2014.  She was found to have a 2.3 cm invasive ductal carcinoma along with DCIS.  There was  lymphatic involvement by tumor.  A single sentinel lymph node contained a macro metastasis.  The surgical margins were widely negative for both invasive and noninvasive disease.  She was presented this morning at the multidisciplinary breast conference.  PREVIOUS RADIATION THERAPY: No   PAST MEDICAL HISTORY:  has a past medical history of Arthritis; Seizures ([place]); DDD (degenerative disc disease), lumbar; and Breast cancer ([place]) (09/10/14).     PAST SURGICAL HISTORY:  Past Surgical History  Procedure Laterality Date  . Cholecystectomy    . Lithotripsy    . Tubal ligation    . Breast surgery  1997    RIGHT BREAST BIOPSY  . Total knee arthroplasty Right 05/14/2012    Procedure: TOTAL KNEE ARTHROPLASTY;  Surgeon: Gearlean Alf, MD;  Location: [facility];  Service: Orthopedics;  Laterality: Right;  . Mastectomy w/ sentinel node biopsy Left 11/20/2014    Procedure: MASTECTOMY WITH SENTINEL LYMPH NODE BIOPSY;  Surgeon: Alphonsa Overall, MD;  Location: [facility];  Service: General;  Laterality: Left;  . Breast reconstruction with placement of tissue expander and flex hd (acellular hydrated dermis) Left 11/20/2014    Procedure: LEFT BREAST RECONSTRUCTION WITH PLACEMENT OF TISSUE EXPANDER AND FLEX HD (ACELLULAR HYDRATED DERMIS) ;  Surgeon: [name], DO;  Location: [facility];  Service: Plastics;  Laterality: Left;     FAMILY HISTORY: family  history includes Breast cancer in her maternal aunt; Cancer in her son; Lung cancer in her father and maternal aunt.  Her father died following a stroke at 66.  He also had a history of myocardial infarction and lung cancer.  Her mother died from congestive heart failure at 59.  A maternal aunt was diagnosed with breast cancer at a postmenopausal age.   SOCIAL HISTORY:  reports that she has never smoked. She has never used smokeless tobacco. She reports that she does not drink alcohol or use illicit drugs.  Married, one of 2  children living.  One son died at age 24 from [place].  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   ALLERGIES: Codeine   MEDICATIONS:  Current Outpatient Prescriptions  Medication Sig Dispense Refill  . acetaminophen (TYLENOL) 650 MG CR tablet Take 1,300 mg by mouth. Takes 2 every am and 2 every pm    . Ascorbic Acid (VITAMIN C) 1000 MG tablet Take by mouth.    Marland Kitchen aspirin EC 81 MG tablet Take by mouth.    . Calcium Carbonate (CALCIUM 600 PO) Take 1 tablet by mouth daily.    . Cholecalciferol (D 2000) 2000 UNITS TABS Take by mouth.    . Glucosamine-Chondroit-Vit C-Mn (GLUCOSAMINE 1500 COMPLEX PO) Take 1 tablet by mouth daily.    Marland Kitchen loratadine (CLARITIN) 10 MG tablet Take 10 mg by mouth daily.    . Multiple Vitamin (MULTI-VITAMINS) TABS Take by mouth.    . naproxen sodium (RA NAPROXEN SODIUM) 220 MG tablet Take by mouth.    . pyridOXINE (B-6) 50 MG tablet Take by mouth.    . TURMERIC PO Take 500 mg by mouth daily.    . Zinc 50 MG CAPS Take 1 capsule by mouth daily.     No current facility-administered medications for this encounter.     REVIEW OF SYSTEMS:  Pertinent items are noted in HPI.    PHYSICAL EXAM:  height is 5' 4"  (1.626 m) and weight is 236 lb 11.2 oz (107.366 kg). Her temperature is 98.1 F (36.7 C). Her blood pressure is 137/84 and her pulse is 82.   Alert and oriented 74 year old white female appearing younger than her stated age.  Head and neck examination: Grossly unremarkable.  Nodes: There is no palpable cervical, supraclavicular, or axillary lymphadenopathy.  Breasts: There is a tissue expander in place along the left chest wall which has already been slightly expanded.  There is redundant tissue along the left chest wall.  Her mastectomy wound is healing well.  She does have a lateral Jackson-Pratt drain.  Right breast without masses or lesions.  Extremities: Without edema.   LABORATORY DATA:  Lab Results  Component Value Date   WBC 5.6 11/17/2014    HGB 14.1 11/17/2014   HCT 44.0 11/17/2014   MCV 89.8 11/17/2014   PLT 213 11/17/2014   Lab Results  Component Value Date   NA 142 11/17/2014   K 4.1 11/17/2014   CL 109 11/17/2014   CO2 27 11/17/2014   Lab Results  Component Value Date   ALT 22 11/17/2014   AST 31 11/17/2014   ALKPHOS 73 11/17/2014   BILITOT 0.3 11/17/2014      IMPRESSION: Pathologic Stage IIB (T2 N1 M0) invasive ductal/DCIS of the left breast.  I explained to the patient and her family the indications for post mastectomy radiation therapy and regional node irradiation following a positive sentinel lymph node biopsy.  Based on the AMAROS  and Z11 trials, radiation therapy, in this setting, can take the place of a completion axillary node dissection with a lower risk for lymphedema.  We also discussed the benefit of radiation in patients with low tumor burden axillary adenopathy.  Based on the [facility] nomogram she has a 23% chance for additional lymph node involvement.  Therefore, I do recommend radiation therapy to her left chest wall/reconstructed breast and regional lymph nodes.  I understand that Dr. Marin Olp is sending off Oncotype DX testing to see if she would be a candidate for adjuvant chemotherapy.  If she receives adjuvant chemotherapy, she can easily be fully expanded during chemotherapy.  If she does not receive chemotherapy then I would delay the start of radiation therapy until she has complete expansion/overexpansion of her tissue expander before starting radiation therapy.  This may be a number of months.  In the meantime, Dr. Marin Olp could give her temporary antiestrogen therapy until we start radiation therapy.  We can treat her with a tissue expander and then Dr. Marla Roe can change out her tissue expander for a permanent implant along with right breast reduction surgery for symmetry months following her radiation therapy.  I discussed the potential acute and late toxicities of radiation  therapy.  We also discussed deep inspiration breath-hold technology which may be useful to avoid cardiac irradiation.  Any case, she would be receiving radiation therapy in early 2017, after my retirement date at the end of the year.  The patient and her family would like to have Dr. Pablo Ledger assume her care and supervise her radiation therapy.  I told her that this could be arranged, and she will see Dr. Pablo Ledger for a follow-up visit when she is ready for radiation therapy.   PLAN: As discussed above.   I spent 60  minutes face to face with the patient and more than 50% of that time was spent in counseling and/or coordination of care.

## 2014-12-03 NOTE — Progress Notes (Signed)
Location of Breast Cancer: Left Breast  Histology per Pathology Report: 11/20/14 Diagnosis 1. Breast, simple mastectomy, left - INVASIVE DUCTAL CARCINOMA, 2.3 CM. - DUCTAL CARCINOMA IN SITU. - MARGINS NOT INVOLVED. - LYMPHATIC INVOLVEMENT BY TUMOR. - BIOPSY SITE AND CLIP IDENTIFIED. - FIBROCYSTIC CHANGES WITH CALCIFICATIONS. 2. Lymph node, sentinel, biopsy, left axillary - METASTATIC CARCINOMA IN ONE LYMPH NODE (1/1).  Receptor Status: ER(100%), PR (70%), Her2-neu (Neg), Ki-67 (10%)   Kimberly [name] was diagnosed after having her annual mamography which prompted further follow up. The patient had mammograms and a biopsy on 10 September 2014. The mammograms showed a 1.5 x 1.4 cm mass surrounded by microcalcifications to an area 2.0 x 4.3 cm in the 2 o'clock position of the left breast.  Past/Anticipated interventions by surgeon, if any: Simple Mastectomy Left Breast   Past/Anticipated interventions by medical oncology, if any: Unknown  Lymphedema issues, if any: None  Pain issues, if any: none   SAFETY ISSUES:  Prior radiation? No  Pacemaker/ICD? No  Possible current pregnancy?No  Is the patient on methotrexate? No  Current Complaints / other details:      Shafiq Larch, Crista Curb, RN 12/03/2014,12:43 PM

## 2014-12-09 ENCOUNTER — Encounter (HOSPITAL_BASED_OUTPATIENT_CLINIC_OR_DEPARTMENT_OTHER): Payer: Self-pay | Admitting: *Deleted

## 2014-12-10 ENCOUNTER — Other Ambulatory Visit: Payer: Self-pay | Admitting: Plastic Surgery

## 2014-12-10 DIAGNOSIS — T792XXA Traumatic secondary and recurrent hemorrhage and seroma, initial encounter: Secondary | ICD-10-CM

## 2014-12-10 DIAGNOSIS — Z9012 Acquired absence of left breast and nipple: Secondary | ICD-10-CM

## 2014-12-10 DIAGNOSIS — IMO0001 Reserved for inherently not codable concepts without codable children: Secondary | ICD-10-CM

## 2014-12-10 DIAGNOSIS — T888XXA Other specified complications of surgical and medical care, not elsewhere classified, initial encounter: Secondary | ICD-10-CM

## 2014-12-11 ENCOUNTER — Encounter (HOSPITAL_COMMUNITY): Payer: Self-pay

## 2014-12-11 ENCOUNTER — Other Ambulatory Visit: Payer: Self-pay | Admitting: Hematology & Oncology

## 2014-12-11 DIAGNOSIS — E559 Vitamin D deficiency, unspecified: Secondary | ICD-10-CM

## 2014-12-11 DIAGNOSIS — M810 Age-related osteoporosis without current pathological fracture: Secondary | ICD-10-CM

## 2014-12-11 DIAGNOSIS — C50212 Malignant neoplasm of upper-inner quadrant of left female breast: Secondary | ICD-10-CM

## 2014-12-12 ENCOUNTER — Ambulatory Visit (HOSPITAL_BASED_OUTPATIENT_CLINIC_OR_DEPARTMENT_OTHER): Payer: Medicare Other | Admitting: Anesthesiology

## 2014-12-12 ENCOUNTER — Encounter (HOSPITAL_BASED_OUTPATIENT_CLINIC_OR_DEPARTMENT_OTHER): Payer: Self-pay | Admitting: *Deleted

## 2014-12-12 ENCOUNTER — Encounter (HOSPITAL_BASED_OUTPATIENT_CLINIC_OR_DEPARTMENT_OTHER)
Admission: RE | Disposition: A | Discharge status: Home / Self Care | Payer: Self-pay | Source: Ambulatory Visit | Attending: Plastic Surgery

## 2014-12-12 ENCOUNTER — Ambulatory Visit (HOSPITAL_BASED_OUTPATIENT_CLINIC_OR_DEPARTMENT_OTHER)
Admission: RE | Admit: 2014-12-12 | Discharge: 2014-12-12 | Disposition: A | Discharge status: Home / Self Care | Payer: Medicare Other | Source: Ambulatory Visit | Attending: Plastic Surgery | Admitting: Plastic Surgery

## 2014-12-12 DIAGNOSIS — Z87442 Personal history of urinary calculi: Secondary | ICD-10-CM | POA: Diagnosis not present

## 2014-12-12 DIAGNOSIS — Z7982 Long term (current) use of aspirin: Secondary | ICD-10-CM | POA: Insufficient documentation

## 2014-12-12 DIAGNOSIS — Z885 Allergy status to narcotic agent status: Secondary | ICD-10-CM | POA: Insufficient documentation

## 2014-12-12 DIAGNOSIS — Z803 Family history of malignant neoplasm of breast: Secondary | ICD-10-CM | POA: Diagnosis not present

## 2014-12-12 DIAGNOSIS — Z853 Personal history of malignant neoplasm of breast: Secondary | ICD-10-CM | POA: Insufficient documentation

## 2014-12-12 DIAGNOSIS — Z9012 Acquired absence of left breast and nipple: Secondary | ICD-10-CM

## 2014-12-12 DIAGNOSIS — T888XXA Other specified complications of surgical and medical care, not elsewhere classified, initial encounter: Secondary | ICD-10-CM

## 2014-12-12 DIAGNOSIS — Z170 Estrogen receptor positive status [ER+]: Secondary | ICD-10-CM | POA: Diagnosis not present

## 2014-12-12 DIAGNOSIS — L7634 Postprocedural seroma of skin and subcutaneous tissue following other procedure: Secondary | ICD-10-CM | POA: Insufficient documentation

## 2014-12-12 DIAGNOSIS — Y836 Removal of other organ (partial) (total) as the cause of abnormal reaction of the patient, or of later complication, without mention of misadventure at the time of the procedure: Secondary | ICD-10-CM | POA: Diagnosis not present

## 2014-12-12 DIAGNOSIS — Y813 Surgical instruments, materials and general- and plastic-surgery devices (including sutures) associated with adverse incidents: Secondary | ICD-10-CM | POA: Diagnosis not present

## 2014-12-12 DIAGNOSIS — IMO0001 Reserved for inherently not codable concepts without codable children: Secondary | ICD-10-CM

## 2014-12-12 DIAGNOSIS — L987 Excessive and redundant skin and subcutaneous tissue: Secondary | ICD-10-CM | POA: Diagnosis not present

## 2014-12-12 DIAGNOSIS — Z96651 Presence of right artificial knee joint: Secondary | ICD-10-CM | POA: Insufficient documentation

## 2014-12-12 DIAGNOSIS — T792XXA Traumatic secondary and recurrent hemorrhage and seroma, initial encounter: Secondary | ICD-10-CM

## 2014-12-12 HISTORY — PX: INCISION AND DRAINAGE OF WOUND: SHX1803

## 2014-12-12 HISTORY — DX: Reserved for concepts with insufficient information to code with codable children: IMO0002

## 2014-12-12 SURGERY — IRRIGATION AND DEBRIDEMENT WOUND
Anesthesia: General | Site: Breast | Laterality: Left

## 2014-12-12 MED ORDER — OXYCODONE HCL 5 MG/5ML PO SOLN: 5.0000 mg | Freq: Once | ORAL | Status: DC | PRN

## 2014-12-12 MED ORDER — PROPOFOL 10 MG/ML IV BOLUS
INTRAVENOUS | Status: DC | PRN
  Administered 2014-12-12: 150 mg via INTRAVENOUS

## 2014-12-12 MED ORDER — DEXAMETHASONE SODIUM PHOSPHATE 4 MG/ML IJ SOLN
INTRAMUSCULAR | Status: DC | PRN
  Administered 2014-12-12: 10 mg via INTRAVENOUS

## 2014-12-12 MED ORDER — OXYCODONE HCL 5 MG PO TABS: 5.0000 mg | ORAL_TABLET | Freq: Once | ORAL | Status: DC | PRN

## 2014-12-12 MED ORDER — POLYMYXIN B SULFATE 500000 UNITS IJ SOLR
INTRAMUSCULAR | Status: DC | PRN
  Administered 2014-12-12: 500 mL

## 2014-12-12 MED ORDER — FENTANYL CITRATE (PF) 100 MCG/2ML IJ SOLN
50.0000 ug | INTRAMUSCULAR | Status: DC | PRN
  Administered 2014-12-12: 100 ug via INTRAVENOUS

## 2014-12-12 MED ORDER — ONDANSETRON HCL 4 MG/2ML IJ SOLN
INTRAMUSCULAR | Status: DC | PRN
  Administered 2014-12-12: 4 mg via INTRAVENOUS

## 2014-12-12 MED ORDER — CEFAZOLIN SODIUM-DEXTROSE 2-3 GM-% IV SOLR
2.0000 g | INTRAVENOUS | Status: AC
  Administered 2014-12-12: 2 g via INTRAVENOUS

## 2014-12-12 MED ORDER — DEXAMETHASONE SODIUM PHOSPHATE 10 MG/ML IJ SOLN
INTRAMUSCULAR | Status: AC
  Filled 2014-12-12: qty 1

## 2014-12-12 MED ORDER — CEFAZOLIN SODIUM-DEXTROSE 2-3 GM-% IV SOLR
INTRAVENOUS | Status: AC
  Filled 2014-12-12: qty 50

## 2014-12-12 MED ORDER — FENTANYL CITRATE (PF) 100 MCG/2ML IJ SOLN: 25.0000 ug | INTRAMUSCULAR | Status: DC | PRN

## 2014-12-12 MED ORDER — MIDAZOLAM HCL 2 MG/2ML IJ SOLN
1.0000 mg | INTRAMUSCULAR | Status: DC | PRN
  Administered 2014-12-12: 2 mg via INTRAVENOUS

## 2014-12-12 MED ORDER — BUPIVACAINE-EPINEPHRINE (PF) 0.25% -1:200000 IJ SOLN
INTRAMUSCULAR | Status: AC
  Filled 2014-12-12: qty 30

## 2014-12-12 MED ORDER — LIDOCAINE HCL (CARDIAC) 20 MG/ML IV SOLN
INTRAVENOUS | Status: AC
  Filled 2014-12-12: qty 5

## 2014-12-12 MED ORDER — MIDAZOLAM HCL 2 MG/2ML IJ SOLN
INTRAMUSCULAR | Status: AC
  Filled 2014-12-12: qty 4

## 2014-12-12 MED ORDER — GLYCOPYRROLATE 0.2 MG/ML IJ SOLN: 0.2000 mg | Freq: Once | INTRAMUSCULAR | Status: DC | PRN

## 2014-12-12 MED ORDER — SUCCINYLCHOLINE CHLORIDE 20 MG/ML IJ SOLN
INTRAMUSCULAR | Status: AC
  Filled 2014-12-12: qty 1

## 2014-12-12 MED ORDER — LACTATED RINGERS IV SOLN
INTRAVENOUS | Status: DC
  Administered 2014-12-12 (×2): via INTRAVENOUS

## 2014-12-12 MED ORDER — FENTANYL CITRATE (PF) 100 MCG/2ML IJ SOLN
INTRAMUSCULAR | Status: AC
  Filled 2014-12-12: qty 4

## 2014-12-12 MED ORDER — ONDANSETRON HCL 4 MG/2ML IJ SOLN
INTRAMUSCULAR | Status: AC
  Filled 2014-12-12: qty 2

## 2014-12-12 MED ORDER — PROPOFOL 500 MG/50ML IV EMUL
INTRAVENOUS | Status: AC
  Filled 2014-12-12: qty 50

## 2014-12-12 MED ORDER — EPHEDRINE SULFATE 50 MG/ML IJ SOLN
INTRAMUSCULAR | Status: AC
  Filled 2014-12-12: qty 1

## 2014-12-12 MED ORDER — SCOPOLAMINE 1 MG/3DAYS TD PT72: 1.0000 | MEDICATED_PATCH | Freq: Once | TRANSDERMAL | Status: DC | PRN

## 2014-12-12 MED ORDER — EPHEDRINE SULFATE 50 MG/ML IJ SOLN
INTRAMUSCULAR | Status: DC | PRN
  Administered 2014-12-12: 10 mg via INTRAVENOUS

## 2014-12-12 MED ORDER — LIDOCAINE-EPINEPHRINE 1 %-1:100000 IJ SOLN
INTRAMUSCULAR | Status: AC
  Filled 2014-12-12: qty 1

## 2014-12-12 MED ORDER — LIDOCAINE HCL (CARDIAC) 20 MG/ML IV SOLN
INTRAVENOUS | Status: DC | PRN
  Administered 2014-12-12: 50 mg via INTRAVENOUS

## 2014-12-12 MED ORDER — SCOPOLAMINE 1 MG/3DAYS TD PT72
MEDICATED_PATCH | TRANSDERMAL | Status: AC
  Filled 2014-12-12: qty 1

## 2014-12-12 MED ORDER — ONDANSETRON HCL 4 MG/2ML IJ SOLN: 4.0000 mg | Freq: Once | INTRAMUSCULAR | Status: DC | PRN

## 2014-12-12 SURGICAL SUPPLY — 69 items
APL SKNCLS STERI-STRIP NONHPOA (GAUZE/BANDAGES/DRESSINGS)
BAG DECANTER FOR FLEXI CONT (MISCELLANEOUS) ×2 IMPLANT
BENZOIN TINCTURE PRP APPL 2/3 (GAUZE/BANDAGES/DRESSINGS) IMPLANT
BINDER BREAST XXLRG (GAUZE/BANDAGES/DRESSINGS) ×2 IMPLANT
BLADE HEX COATED 2.75 (ELECTRODE) ×2 IMPLANT
BLADE SURG 10 STRL SS (BLADE) ×2 IMPLANT
BLADE SURG 15 STRL LF DISP TIS (BLADE) ×1 IMPLANT
BLADE SURG 15 STRL SS (BLADE) ×3
CANISTER SUCT 1200ML W/VALVE (MISCELLANEOUS) ×2 IMPLANT
CHLORAPREP W/TINT 26ML (MISCELLANEOUS) ×4 IMPLANT
CLOSURE WOUND 1/2 X4 (GAUZE/BANDAGES/DRESSINGS)
COVER BACK TABLE 60X90IN (DRAPES) ×3 IMPLANT
COVER MAYO STAND STRL (DRAPES) ×3 IMPLANT
DECANTER SPIKE VIAL GLASS SM (MISCELLANEOUS) IMPLANT
DRAIN CHANNEL 19F RND (DRAIN) ×2 IMPLANT
DRAIN PENROSE 1/2X12 LTX STRL (WOUND CARE) IMPLANT
DRAPE INCISE IOBAN 66X45 STRL (DRAPES) IMPLANT
DRAPE LAPAROSCOPIC ABDOMINAL (DRAPES) IMPLANT
DRAPE LAPAROTOMY 100X72 PEDS (DRAPES) ×2 IMPLANT
DRSG ADAPTIC 3X8 NADH LF (GAUZE/BANDAGES/DRESSINGS) IMPLANT
DRSG EMULSION OIL 3X3 NADH (GAUZE/BANDAGES/DRESSINGS) IMPLANT
DRSG PAD ABDOMINAL 8X10 ST (GAUZE/BANDAGES/DRESSINGS) ×2 IMPLANT
ELECT REM PT RETURN 9FT ADLT (ELECTROSURGICAL) ×3
ELECTRODE REM PT RTRN 9FT ADLT (ELECTROSURGICAL) ×1 IMPLANT
EVACUATOR SILICONE 100CC (DRAIN) ×2 IMPLANT
GAUZE SPONGE 4X4 12PLY STRL (GAUZE/BANDAGES/DRESSINGS) ×3 IMPLANT
GAUZE XEROFORM 1X8 LF (GAUZE/BANDAGES/DRESSINGS) IMPLANT
GAUZE XEROFORM 5X9 LF (GAUZE/BANDAGES/DRESSINGS) IMPLANT
GLOVE BIO SURGEON STRL SZ 6.5 (GLOVE) ×4 IMPLANT
GLOVE BIO SURGEONS STRL SZ 6.5 (GLOVE) ×2
GLOVE EXAM NITRILE EXT CUFF MD (GLOVE) ×2 IMPLANT
GLOVE SURG SS PI 7.0 STRL IVOR (GLOVE) ×2 IMPLANT
GOWN STRL REUS W/ TWL LRG LVL3 (GOWN DISPOSABLE) ×2 IMPLANT
GOWN STRL REUS W/TWL LRG LVL3 (GOWN DISPOSABLE) ×6
IV NS IRRIG 3000ML ARTHROMATIC (IV SOLUTION) IMPLANT
MANIFOLD NEPTUNE II (INSTRUMENTS) IMPLANT
NDL HYPO 25X1 1.5 SAFETY (NEEDLE) IMPLANT
NEEDLE HYPO 25X1 1.5 SAFETY (NEEDLE) IMPLANT
NS IRRIG 1000ML POUR BTL (IV SOLUTION) ×3 IMPLANT
PACK BASIN DAY SURGERY FS (CUSTOM PROCEDURE TRAY) ×3 IMPLANT
PENCIL BUTTON HOLSTER BLD 10FT (ELECTRODE) ×3 IMPLANT
PIN SAFETY STERILE (MISCELLANEOUS) ×2 IMPLANT
SHEET MEDIUM DRAPE 40X70 STRL (DRAPES) IMPLANT
SLEEVE SCD COMPRESS KNEE MED (MISCELLANEOUS) ×2 IMPLANT
SPONGE GAUZE 4X4 12PLY STER LF (GAUZE/BANDAGES/DRESSINGS) IMPLANT
SPONGE LAP 18X18 X RAY DECT (DISPOSABLE) ×4 IMPLANT
STAPLER VISISTAT 35W (STAPLE) IMPLANT
STRIP CLOSURE SKIN 1/2X4 (GAUZE/BANDAGES/DRESSINGS) IMPLANT
SUCTION FRAZIER TIP 10 FR DISP (SUCTIONS) IMPLANT
SURGILUBE 2OZ TUBE FLIPTOP (MISCELLANEOUS) IMPLANT
SUT MNCRL AB 4-0 PS2 18 (SUTURE) ×5 IMPLANT
SUT MON AB 3-0 SH 27 (SUTURE) ×9
SUT MON AB 3-0 SH27 (SUTURE) IMPLANT
SUT SILK 3 0 PS 1 (SUTURE) ×2 IMPLANT
SUT VIC AB 3-0 FS2 27 (SUTURE) IMPLANT
SUT VIC AB 5-0 PS2 18 (SUTURE) IMPLANT
SUT VICRYL 4-0 PS2 18IN ABS (SUTURE) IMPLANT
SWAB COLLECTION DEVICE MRSA (MISCELLANEOUS) ×2 IMPLANT
SYR BULB IRRIGATION 50ML (SYRINGE) ×2 IMPLANT
SYR CONTROL 10ML LL (SYRINGE) IMPLANT
TAPE HYPAFIX 6 X30' (GAUZE/BANDAGES/DRESSINGS)
TAPE HYPAFIX 6X30 (GAUZE/BANDAGES/DRESSINGS) IMPLANT
TOWEL OR 17X24 6PK STRL BLUE (TOWEL DISPOSABLE) ×5 IMPLANT
TRAY DSU PREP LF (CUSTOM PROCEDURE TRAY) IMPLANT
TUBE ANAEROBIC SPECIMEN COL (MISCELLANEOUS) ×2 IMPLANT
TUBE CONNECTING 20'X1/4 (TUBING) ×1
TUBE CONNECTING 20X1/4 (TUBING) ×1 IMPLANT
UNDERPAD 30X30 (UNDERPADS AND DIAPERS) ×2 IMPLANT
YANKAUER SUCT BULB TIP NO VENT (SUCTIONS) ×2 IMPLANT

## 2014-12-12 NOTE — Brief Op Note (Signed)
12/12/2014  8:37 AM  PATIENT:  Kimberly [name]  74 y.o. female  PRE-OPERATIVE DIAGNOSIS:  LEFT BREAST SAROMA   POST-OPERATIVE DIAGNOSIS:  * No post-op diagnosis entered *  PROCEDURE:  Procedure(s): LEFT BREAST DRAINAGE OF SAROMA  (Left)  SURGEON:  Surgeon(s) and Role:    * Drayven Marchena S Murad Staples, DO - Primary  ASSISTANTS: none   ANESTHESIA:   general  EBL:  Total I/O In: 1200 [I.V.:1200] Out: -   BLOOD ADMINISTERED:none  DRAINS: (1) Jackson-Pratt drain(s) with closed bulb suction in the left breast pocket   LOCAL MEDICATIONS USED:  NONE  SPECIMEN:  Source of Specimen:  mastectomy scar, breast fluid  DISPOSITION OF SPECIMEN:  PATHOLOGY, micro  COUNTS:  YES  TOURNIQUET:  * No tourniquets in log *  DICTATION: .Dragon Dictation  PLAN OF CARE: Discharge to home after PACU  PATIENT DISPOSITION:  PACU - hemodynamically stable.   Delay start of Pharmacological VTE agent (>24hrs) due to surgical blood loss or risk of bleeding: no

## 2014-12-12 NOTE — H&P (View-Only) (Signed)
Kimberly [name] is an 74 y.o. female.   Chief Complaint: left breast cancer HPI: The patient is a 74 yrs old wf here with her daughter for history and physical for left mastectomy immediate reconstruction with expander and ADM. She is 5 feet 4 inches tall and weighs 230 pounds. She wears a 6 D and would like to be around the same size or smaller. 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 She has a history of right knee replacement, kidney stones, colonoscopy and cholecystectomy. An MRI was clear on the right and an additional site seen on the left. At this time she is wanting to have a left mastectomy with reconstruction and then surgery to have the right side match.  Past Medical History  Diagnosis Date  . Arthritis   . Seizures     hx of one seizure in 1963--no known reason for the seizure and none since  . DDD (degenerative disc disease), lumbar   . Breast cancer 09/10/14    Right Breast    Past Surgical History  Procedure Laterality Date  . Cholecystectomy    . Lithotripsy    . Tubal ligation    . Breast surgery  1997    RIGHT BREAST BIOPSY  . Total knee arthroplasty Right 05/14/2012    Procedure: TOTAL KNEE ARTHROPLASTY;  Surgeon: Gearlean Alf, MD;  Location: [facility];  Service: Orthopedics;  Laterality: Right;    Family History  Problem Relation Age of Onset  . Breast cancer Maternal Aunt    Social History:  reports that she has never smoked. She has never used smokeless tobacco. She reports that she does not drink alcohol or use illicit drugs.  Allergies:  Allergies  Allergen Reactions  . Codeine Nausea And Vomiting    Medications Prior to Admission  Medication Sig Dispense Refill  . acetaminophen (TYLENOL) 650 MG CR tablet Take 1,300 mg by mouth. Takes 2 every am and 2 every pm    . loratadine (CLARITIN) 10  MG tablet Take 10 mg by mouth daily.    . Ascorbic Acid (VITAMIN C) 1000 MG tablet Take by mouth.    Marland Kitchen aspirin EC 81 MG tablet Take by mouth.    . Cholecalciferol (D 2000) 2000 UNITS TABS Take by mouth.    . Multiple Vitamin (MULTI-VITAMINS) TABS Take by mouth.    . naproxen sodium (RA NAPROXEN SODIUM) 220 MG tablet Take by mouth.    . pyridOXINE (B-6) 50 MG tablet Take by mouth.      No results found for this or any previous visit (from the past 48 hour(s)). Nm Sentinel Node Inj-no Rpt (breast)  11/20/2014   CLINICAL DATA: left breast cancer   Sulfur colloid was injected intradermally by the nuclear medicine  technologist for breast cancer sentinel node localization.     Review of Systems  Constitutional: Negative.   HENT: Negative.   Eyes: Negative.   Respiratory: Negative.   Cardiovascular: Negative.   Gastrointestinal: Negative.   Genitourinary: Negative.   Musculoskeletal: Negative.   Skin: Negative.   Neurological: Negative.   Psychiatric/Behavioral: Negative.     Blood pressure 100/51, pulse 75, temperature 97.9 F (36.6 C), temperature source Oral, resp. rate 13, height 5\' 4"  (1.626 m), weight 106.142 kg (234 lb), SpO2 100 %. Physical Exam  Constitutional: She is oriented to person, place, and time.  She appears well-developed and well-nourished.  HENT:  Head: Normocephalic and atraumatic.  Eyes: Conjunctivae and EOM are normal. Pupils are equal, round, and reactive to light.  Cardiovascular: Normal rate.   Respiratory: Effort normal.  Musculoskeletal: Normal range of motion.  Neurological: She is alert and oriented to person, place, and time.  Skin: Skin is warm.  Psychiatric: She has a normal mood and affect. Her behavior is normal. Judgment and thought content normal.     Assessment/Plan Plan for immediate left breast reconstruction with expander and FlexHD.  [name] 11/20/2014, 7:50 AM

## 2014-12-12 NOTE — Anesthesia Preprocedure Evaluation (Addendum)
Anesthesia Evaluation  Patient identified by MRN, date of birth, ID band Patient awake    Reviewed: Allergy & Precautions, NPO status , Patient's Chart, lab work & pertinent test results  Airway Mallampati: II  TM Distance: >3 FB Neck ROM: Full    Dental  (+) Teeth Intact, Dental Advisory Given   Pulmonary    breath sounds clear to auscultation       Cardiovascular  Rhythm:Regular Rate:Normal     Neuro/Psych    GI/Hepatic   Endo/Other    Renal/GU      Musculoskeletal   Abdominal   Peds  Hematology   Anesthesia Other Findings   Reproductive/Obstetrics                            Anesthesia Physical Anesthesia Plan  ASA: II  Anesthesia Plan: General   Post-op Pain Management:    Induction: Intravenous  Airway Management Planned: LMA  Additional Equipment:   Intra-op Plan:   Post-operative Plan:   Informed Consent: I have reviewed the patients History and Physical, chart, labs and discussed the procedure including the risks, benefits and alternatives for the proposed anesthesia with the patient or authorized representative who has indicated his/her understanding and acceptance.   Dental advisory given  Plan Discussed with: CRNA and Anesthesiologist  Anesthesia Plan Comments:         Anesthesia Quick Evaluation  

## 2014-12-12 NOTE — Anesthesia Procedure Notes (Signed)
Procedure Name: LMA Insertion Date/Time: 12/12/2014 7:38 AM Performed by: Melynda Ripple D Pre-anesthesia Checklist: Patient identified, Emergency Drugs available, Suction available and Patient being monitored Patient Re-evaluated:Patient Re-evaluated prior to inductionOxygen Delivery Method: Circle System Utilized Preoxygenation: Pre-oxygenation with 100% oxygen Intubation Type: IV induction Ventilation: Mask ventilation without difficulty LMA: LMA inserted LMA Size: 4.0 Number of attempts: 1 Airway Equipment and Method: Bite block Placement Confirmation: positive ETCO2 Tube secured with: Tape Dental Injury: Teeth and Oropharynx as per pre-operative assessment

## 2014-12-12 NOTE — Interval H&P Note (Signed)
History and Physical Interval Note:  12/12/2014 7:03 AM  Kimberly [name]  has presented today for surgery, with the diagnosis of LEFT BREAST SAROMA   The various methods of treatment have been discussed with the patient and family. After consideration of risks, benefits and other options for treatment, the patient has consented to  Procedure(s): LEFT BREAST DRAINAGE OF SAROMA  (Left) as a surgical intervention .  The patient's history has been reviewed, patient examined, no change in status, stable for surgery.  I have reviewed the patient's chart and labs.  Questions were answered to the patient's satisfaction.     [name]

## 2014-12-12 NOTE — Op Note (Signed)
Operative Note   DATE OF OPERATION: 12/12/2014  LOCATION: [place]  SURGICAL DIVISION: Plastic Surgery  PREOPERATIVE DIAGNOSES:  Left breast seroma after mastectomy and immediate reconstruction with expander and ADM placement  POSTOPERATIVE DIAGNOSES:  same  PROCEDURE:  Drainage of left breast pocket seroma with excision of excess skin 3 x 18 cm  SURGEON: [employer], DO  ASSISTANT: Shawn Rayburn, PA  ANESTHESIA:  General.   COMPLICATIONS: None.   INDICATIONS FOR PROCEDURE:  The patient, Kimberly [name] is a 74 y.o. female born on 02/02/1941, is here for treatment of a left breast seroma after mastectomy and immediate reconstruction with expander and ADM placement. MRN: [id number]  CONSENT:  Informed consent was obtained directly from the patient. Risks, benefits and alternatives were fully discussed. Specific risks including but not limited to bleeding, infection, hematoma, seroma, scarring, pain, infection, contracture, asymmetry, wound healing problems, and need for further surgery were all discussed. The patient did have an ample opportunity to have questions answered to satisfaction.   DESCRIPTION OF PROCEDURE:  The patient was taken to the operating room. SCDs were placed and IV antibiotics were given. The patient's operative site was prepped and draped in a sterile fashion. A time out was performed and all information was confirmed to be correct.  General anesthesia was administered.  The incision was marked and a #10 blade used to make the incision that included the previous mastectomy incision and excess skin 3 x 18 cm.  Hemostasis was achieved with electrocautery.  Gram stain culture and sensitivity sample obtained.  The pocket was irrigated with saline and antibiotic solution.  The bovie was used to rough up the inside tissue.  The lateral flap was tacked to the chest wall to diminish excess space.  A new drain was placed.  The previous drain was  removed prior to prep.  The drain was secured with 3-0 Silk.  The deep layers were closed with 3-0 Monocryl followed by 4-0 and 5-0 Monocryl for the skin.  Dermabond was applied.  The patient tolerated the procedure well.  There were no complications. The patient was allowed to wake from anesthesia, extubated and taken to the recovery room in satisfactory condition.

## 2014-12-12 NOTE — Anesthesia Postprocedure Evaluation (Signed)
  Anesthesia Post-op Note  Patient: Kimberly [name]  Procedure(s) Performed: Procedure(s): LEFT BREAST DRAINAGE OF SEROMA  AND EXCISION OF EXCESS SKIN (Left)  Patient Location: PACU  Anesthesia Type:General  Level of Consciousness: awake, alert  and oriented  Airway and Oxygen Therapy: Patient Spontanous Breathing  Post-op Pain: mild  Post-op Assessment: Post-op Vital signs reviewed, Patient's Cardiovascular Status Stable, Respiratory Function Stable, Patent Airway and Pain level controlled              Post-op Vital Signs: stable  Last Vitals:  Filed Vitals:   12/12/14 0918  BP: 114/66  Pulse: 81  Temp: 36.7 C  Resp: 16    Complications: No apparent anesthesia complications

## 2014-12-12 NOTE — Transfer of Care (Signed)
Immediate Anesthesia Transfer of Care Note  Patient: Kimberly [name]  Procedure(s) Performed: Procedure(s): LEFT BREAST DRAINAGE OF SEROMA  AND EXCISION OF EXCESS SKIN (Left)  Patient Location: PACU  Anesthesia Type:General  Level of Consciousness: awake, alert  and oriented  Airway & Oxygen Therapy: Patient Spontanous Breathing and Patient connected to face mask oxygen  Post-op Assessment: Report given to RN and Post -op Vital signs reviewed and stable  Post vital signs: Reviewed and stable  Last Vitals:  Filed Vitals:   12/12/14 0629  BP: 109/75  Pulse: 82  Temp: 36.7 C  Resp: 20    Complications: No apparent anesthesia complications

## 2014-12-12 NOTE — Discharge Instructions (Addendum)
Continue with binder May shower tomorrow  Call your surgeon if you experience:   1.  Fever over 101.0. 2.  Inability to urinate. 3.  Nausea and/or vomiting. 4.  Extreme swelling or bruising at the surgical site. 5.  Continued bleeding from the incision. 6.  Increased pain, redness or drainage from the incision. 7.  Problems related to your pain medication. 8. Any change in color, movement and/or sensation 9. Any problems and/or concerns   Post Anesthesia Home Care Instructions  Activity: Get plenty of rest for the remainder of the day. A responsible adult should stay with you for 24 hours following the procedure.  For the next 24 hours, DO NOT: -Drive a car -Paediatric nurse -Drink alcoholic beverages -Take any medication unless instructed by your physician -Make any legal decisions or sign important papers.  Meals: Start with liquid foods such as gelatin or soup. Progress to regular foods as tolerated. Avoid greasy, spicy, heavy foods. If nausea and/or vomiting occur, drink only clear liquids until the nausea and/or vomiting subsides. Call your physician if vomiting continues.  Special Instructions/Symptoms: Your throat may feel dry or sore from the anesthesia or the breathing tube placed in your throat during surgery. If this causes discomfort, gargle with warm salt water. The discomfort should disappear within 24 hours.  You had a scopolamine patch placed behind your LEFT ear for the management of post- operative nausea and/or vomiting:  1. The medication in the patch is effective for 72 hours, after which it should be removed.  Wrap patch in a tissue and discard in the trash. Wash hands thoroughly with soap and water. 2. You may remove the patch earlier than 72 hours if you experience unpleasant side effects which may include dry mouth, dizziness or visual disturbances. 3. Avoid touching the patch. Wash your hands with soap and water after contact with the patch.

## 2014-12-15 ENCOUNTER — Encounter (HOSPITAL_BASED_OUTPATIENT_CLINIC_OR_DEPARTMENT_OTHER): Payer: Self-pay | Admitting: Plastic Surgery

## 2014-12-16 LAB — WOUND CULTURE: Gram Stain: NONE SEEN

## 2014-12-17 LAB — ANAEROBIC CULTURE: Gram Stain: NONE SEEN

## 2014-12-26 ENCOUNTER — Ambulatory Visit: Payer: Medicare Other | Admitting: Hematology & Oncology

## 2014-12-26 ENCOUNTER — Other Ambulatory Visit: Payer: Medicare Other

## 2014-12-29 ENCOUNTER — Ambulatory Visit (HOSPITAL_BASED_OUTPATIENT_CLINIC_OR_DEPARTMENT_OTHER): Payer: Medicare Other | Admitting: Hematology & Oncology

## 2014-12-29 ENCOUNTER — Encounter: Payer: Self-pay | Admitting: Hematology & Oncology

## 2014-12-29 ENCOUNTER — Other Ambulatory Visit (HOSPITAL_BASED_OUTPATIENT_CLINIC_OR_DEPARTMENT_OTHER): Payer: Medicare Other

## 2014-12-29 VITALS — BP 118/73 | HR 75 | Temp 97.6°F | Resp 16 | Ht 64.0 in | Wt 231.0 lb

## 2014-12-29 DIAGNOSIS — E559 Vitamin D deficiency, unspecified: Secondary | ICD-10-CM

## 2014-12-29 DIAGNOSIS — M818 Other osteoporosis without current pathological fracture: Secondary | ICD-10-CM

## 2014-12-29 DIAGNOSIS — C50212 Malignant neoplasm of upper-inner quadrant of left female breast: Secondary | ICD-10-CM

## 2014-12-29 DIAGNOSIS — C773 Secondary and unspecified malignant neoplasm of axilla and upper limb lymph nodes: Secondary | ICD-10-CM | POA: Diagnosis not present

## 2014-12-29 DIAGNOSIS — T386X5A Adverse effect of antigonadotrophins, antiestrogens, antiandrogens, not elsewhere classified, initial encounter: Secondary | ICD-10-CM

## 2014-12-29 DIAGNOSIS — Z170 Estrogen receptor positive status [ER+]: Secondary | ICD-10-CM | POA: Diagnosis not present

## 2014-12-29 DIAGNOSIS — C50412 Malignant neoplasm of upper-outer quadrant of left female breast: Secondary | ICD-10-CM | POA: Diagnosis present

## 2014-12-29 DIAGNOSIS — C50112 Malignant neoplasm of central portion of left female breast: Secondary | ICD-10-CM

## 2014-12-29 DIAGNOSIS — M810 Age-related osteoporosis without current pathological fracture: Secondary | ICD-10-CM

## 2014-12-29 LAB — CBC WITH DIFFERENTIAL (CANCER CENTER ONLY)
BASO#: 0.2 10*3/uL (ref 0.0–0.2)
BASO%: 2.2 % — AB (ref 0.0–2.0)
EOS%: 12 % — AB (ref 0.0–7.0)
Eosinophils Absolute: 0.9 10*3/uL — ABNORMAL HIGH (ref 0.0–0.5)
HEMATOCRIT: 43.3 % (ref 34.8–46.6)
HEMOGLOBIN: 13.8 g/dL (ref 11.6–15.9)
LYMPH#: 1.5 10*3/uL (ref 0.9–3.3)
LYMPH%: 21.4 % (ref 14.0–48.0)
MCH: 28.2 pg (ref 26.0–34.0)
MCHC: 31.9 g/dL — ABNORMAL LOW (ref 32.0–36.0)
MCV: 88 fL (ref 81–101)
MONO#: 1 10*3/uL — ABNORMAL HIGH (ref 0.1–0.9)
MONO%: 13.2 % — ABNORMAL HIGH (ref 0.0–13.0)
NEUT%: 51.2 % (ref 39.6–80.0)
NEUTROS ABS: 3.7 10*3/uL (ref 1.5–6.5)
Platelets: 277 10*3/uL (ref 145–400)
RBC: 4.9 10*6/uL (ref 3.70–5.32)
RDW: 14.4 % (ref 11.1–15.7)
WBC: 7.2 10*3/uL (ref 3.9–10.0)

## 2014-12-29 LAB — COMPREHENSIVE METABOLIC PANEL (CC13)
ALT: 23 U/L (ref 0–55)
ANION GAP: 8 meq/L (ref 3–11)
AST: 26 U/L (ref 5–34)
Albumin: 3.4 g/dL — ABNORMAL LOW (ref 3.5–5.0)
Alkaline Phosphatase: 92 U/L (ref 40–150)
BILIRUBIN TOTAL: 0.33 mg/dL (ref 0.20–1.20)
BUN: 20.4 mg/dL (ref 7.0–26.0)
CHLORIDE: 109 meq/L (ref 98–109)
CO2: 27 meq/L (ref 22–29)
CREATININE: 1.1 mg/dL (ref 0.6–1.1)
Calcium: 10.1 mg/dL (ref 8.4–10.4)
EGFR: 50 mL/min/{1.73_m2} — AB (ref >90)
Glucose: 91 mg/dl (ref 70–140)
Potassium: 4.9 mEq/L (ref 3.5–5.1)
Sodium: 144 mEq/L (ref 136–145)
TOTAL PROTEIN: 6.7 g/dL (ref 6.4–8.3)

## 2014-12-29 MED ORDER — LETROZOLE 2.5 MG PO TABS: 2.5000 mg | ORAL_TABLET | Freq: Every day | ORAL | Status: DC

## 2014-12-30 ENCOUNTER — Telehealth: Payer: Self-pay | Admitting: *Deleted

## 2014-12-30 LAB — VITAMIN D 25 HYDROXY (VIT D DEFICIENCY, FRACTURES): VIT D 25 HYDROXY: 45 ng/mL (ref 30–100)

## 2014-12-30 NOTE — Progress Notes (Signed)
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  Oncotype score is 7 Chief Complaint  Patient presents with  . Follow-up  : I have had breast cancer in my left breast.  HPI: Kimberly [name] is a very nice 74 year old postmenopausal female. I know her well since we take care of her husband who had lymphoma.  She had a routine mammogram back in July. There is some asymmetry in the left breast. She had additional studies done. She is noted to have a mass in the upper-outer quadrant with some calcifications. An ultrasound confirmed this mass to be at the 2:00 position. Patient underwent a biopsy. Initial biopsy showed that she had an invasive ductal carcinoma. The tumor was ER positive, PR positive, and HER-2 negative.  She then underwent a breast MRI. This showed a enhancing mass in the upper-outer quadrant of the left breast. There are no abnormal lymph nodes.  She was seen by Dr. Lucia Gaskins. She also was seen by plastic surgery.  She elected for a left modified radical mastectomy. She had her surgery on September 29.  The pathology report ([id number]) showed invasive ductal carcinoma that measured 2.3 cm. She also had some ductal carcinoma in situ. There was lymphatic involvement by tumor. She had one positive sentinel lymph node. This was greater than 2 mm in size. She had a low proliferation index of 10%.  We just enough an Oncotype assay on her. Surprising, this was very low with a score of 7.  She's had some issues with the mastectomy. She has had some expanders put in. She has a drainage tube placed. I'm not sure when this is going to come out.  She was kindly referred for the consideration of systemic therapy.  She has already seeing Dr. Valere Dross of radiation oncology. He will plan for adjuvant radiation therapy. She actually will see Dr. Pablo Ledger as Dr. Huel Cote is going to retire.  Overall, she feels well. She has a history of breast  cancer in her family with a maternal aunt. There also is a history of lung cancer with her father. A son died from [place].  She has 2 children. One of her children passed away at low is all from [facility] sarcoma.  She is postmenopausal.  She has never smoked. There is no history of a car use. She is retired from [employer].  Overall, her performance status is ECOG 1. Para she does have some pain in her left knee. She has had right knee surgery in the past. She probably will need left the surgery.      Past Medical History  Diagnosis Date  . Arthritis   . Seizures (HCC)     hx of one seizure in 1963--no known reason for the seizure and none since  . DDD (degenerative disc disease), lumbar   . Breast cancer ([place]) 09/10/14    Right Breast  . Seroma     left breast  :  Past Surgical History  Procedure Laterality Date  . Cholecystectomy    . Lithotripsy    . Tubal ligation    . Breast surgery  1997    RIGHT BREAST BIOPSY  . Total knee arthroplasty Right 05/14/2012    Procedure: TOTAL KNEE ARTHROPLASTY;  Surgeon: Gearlean Alf, MD;  Location: [facility];  Service: Orthopedics;  Laterality: Right;  . Mastectomy w/ sentinel node biopsy Left 11/20/2014    Procedure: MASTECTOMY WITH SENTINEL LYMPH NODE BIOPSY;  Surgeon: Alphonsa Overall, MD;  Location: [place];  Service: General;  Laterality: Left;  . Breast reconstruction with placement of tissue expander and flex hd (acellular hydrated dermis) Left 11/20/2014    Procedure: LEFT BREAST RECONSTRUCTION WITH PLACEMENT OF TISSUE EXPANDER AND FLEX HD (ACELLULAR HYDRATED DERMIS) ;  Surgeon: [name], DO;  Location: [place];  Service: Plastics;  Laterality: Left;  . Incision and drainage of wound Left 12/12/2014    Procedure: LEFT BREAST DRAINAGE OF SEROMA  AND EXCISION OF EXCESS SKIN;  Surgeon: [name], DO;  Location: [place];  Service: Plastics;   Laterality: Left;  :   Current outpatient prescriptions:  .  acetaminophen (TYLENOL) 650 MG CR tablet, Take 1,300 mg by mouth. Takes 2 every am and 2 every pm, Disp: , Rfl:  .  Ascorbic Acid (VITAMIN C) 1000 MG tablet, Take by mouth., Disp: , Rfl:  .  aspirin EC 81 MG tablet, Take by mouth., Disp: , Rfl:  .  Calcium Carbonate (CALCIUM 600 PO), Take 1 tablet by mouth daily., Disp: , Rfl:  .  Cholecalciferol (D 2000) 2000 UNITS TABS, Take by mouth., Disp: , Rfl:  .  Glucosamine-Chondroit-Vit C-Mn (GLUCOSAMINE 1500 COMPLEX PO), Take 1 tablet by mouth daily., Disp: , Rfl:  .  loratadine (CLARITIN) 10 MG tablet, Take 10 mg by mouth daily., Disp: , Rfl:  .  Multiple Vitamin (MULTI-VITAMINS) TABS, Take by mouth., Disp: , Rfl:  .  naproxen sodium (RA NAPROXEN SODIUM) 220 MG tablet, Take by mouth., Disp: , Rfl:  .  pyridOXINE (B-6) 50 MG tablet, Take by mouth., Disp: , Rfl:  .  sulfamethoxazole-trimethoprim (BACTRIM DS,SEPTRA DS) 800-160 MG tablet, Take 1 tablet by mouth 2 (two) times daily., Disp: , Rfl:  .  TURMERIC PO, Take 500 mg by mouth daily., Disp: , Rfl:  .  Zinc 50 MG CAPS, Take 1 capsule by mouth daily., Disp: , Rfl:  .  letrozole (FEMARA) 2.5 MG tablet, Take 1 tablet (2.5 mg total) by mouth daily., Disp: 30 tablet, Rfl: 12:  :  Allergies  Allergen Reactions  . Codeine Nausea And Vomiting  :  Family History  Problem Relation Age of Onset  . Breast cancer Maternal Aunt   . Cancer Son     ewing's sarcoma  . Lung cancer Maternal Aunt   . Lung cancer Father   :  Social History   Social History  . Marital Status: Married    Spouse Name: N/A  . Number of Children: N/A  . Years of Education: N/A   Occupational History  . Not on file.   Social History Main Topics  . Smoking status: Never Smoker   . Smokeless tobacco: Never Used  . Alcohol Use: No  . Drug Use: No  . Sexual Activity: Not on file   Other Topics Concern  . Not on file   Social History Narrative   :  Pertinent items are noted in HPI.  Exam: @IPVITALS @ Well-developed and well-nourished white female in no obvious distress. Vital signs show temperature of 97.6. Pulse 75. Blood pressure 118/73. Her weight is 231 pounds. Head and neck exam shows no ocular or oral lesions. She has no palpable cervical or supraclavicular lymph nodes. Lungs are clear. Cardiac exam regular rate and rhythm with no murmurs, rubs or bruits. Breast exam shows right breast with no masses, edema or erythema. There is no right axillary adenopathy. Left chest wall shows a  drainage tube in. She has a healing mastectomy scar. No erythema is noted. No obvious fluid collection is noted. She has no palpable left axillary lymph node. Abdominal exam shows no fluid wave. There is no guarding or rebound tenderness. There is no abdominal mass. Back exam shows no tenderness over the spine, ribs or hips. Extremities shows no clubbing, cyanosis or edema. No lymphedema is noted in the arms. Skin exam shows no rashes, ecchymoses or petechia. Neurological exam is nonfocal.   Recent Labs  12/29/14 0757  WBC 7.2  HGB 13.8  HCT 43.3  PLT 277    Recent Labs  12/29/14 0758  NA 144  K 4.9  CO2 27  GLUCOSE 91  BUN 20.4  CREATININE 1.1  CALCIUM 10.1    Blood smear review:  None   Pathology: See above    Assessment and Plan:  Kimberly [name] is a very nice 74 year old postmenopausal female with stage IIb that a carcinoma of the left breast. She had one positive lymph node.  I think the decision for adjuvant therapy is being driven by her low Oncotype score. Her Oncotype score is only 7. As such, I do not see a role for systemic chemotherapy on top of hormonal therapy.  I think she would be a great candidate for Femara. I don't see any complication for her to be on Femara. I also think she would be a good candidate for Prolia. Some recent studies have shown that Prolia twice a year does improve survival and decreases the risk of  bone metastasis.  I spent about an hour with she and her family. I have known for several years. I discussed and sad that she has malignancy. Thankfully, we do not need to give her any chemotherapy.  I will plan to see her back in about one month. We'll start the Prolia at that time.  I told her that radiation oncology will decide when to start her radiation treatments.

## 2014-12-30 NOTE — Telephone Encounter (Addendum)
Patient aware of results.   ----- Message from Volanda Napoleon, MD sent at 12/30/2014  6:31 AM EST ----- Call - vit D level is great!!  Kimberly [name]

## 2015-01-05 DIAGNOSIS — C50112 Malignant neoplasm of central portion of left female breast: Secondary | ICD-10-CM | POA: Insufficient documentation

## 2015-01-21 ENCOUNTER — Encounter (HOSPITAL_COMMUNITY): Payer: Self-pay | Admitting: *Deleted

## 2015-01-21 NOTE — Progress Notes (Signed)
Pt denies cardiac history, chest pain or sob. 

## 2015-01-21 NOTE — Anesthesia Preprocedure Evaluation (Addendum)
Anesthesia Evaluation  Patient identified by MRN, date of birth, ID band Patient awake  General Assessment Comment: Kimberly [name] is a 74 y/o female with history significant for PONV,carcinoma of the left breast, arthritis, and obesity who presents today for a IRRIGATION AND DEBRIDEMENT OF LEFT BREAST POCKET WITH REMOVAL TISSUE EXPANDER AND REPLACEMENT EXPANDER, POSSIBLE [place] (Left ) by Dr. Annabell Sabal d/t left breast seroma.   Reviewed: Allergy & Precautions, NPO status , Patient's Chart, lab work & pertinent test results  History of Anesthesia Complications (+) PONV and history of anesthetic complications  Airway Mallampati: II  TM Distance: >3 FB Neck ROM: Full    Dental no notable dental hx. (+) Teeth Intact, Dental Advisory Given   Pulmonary neg pulmonary ROS,    Pulmonary exam normal breath sounds clear to auscultation       Cardiovascular negative cardio ROS Normal cardiovascular exam Rhythm:Regular Rate:Normal     Neuro/Psych Seizures -, Well Controlled,  hx of one seizure in 1963--no known reason for the seizure and none since negative neurological ROS  negative psych ROS   GI/Hepatic negative GI ROS, Neg liver ROS,   Endo/Other  Morbid obesity  Renal/GU Renal diseaseKidney stones  negative genitourinary   Musculoskeletal negative musculoskeletal ROS (+) Arthritis ,   Abdominal   Peds negative pediatric ROS (+)  Hematology negative hematology ROS (+)   Anesthesia Other Findings   Reproductive/Obstetrics negative OB ROS                           BP Readings from Last 3 Encounters:  12/29/14 118/73  12/12/14 114/66  12/03/14 137/84   Lab Results  Component Value Date   WBC 7.2 12/29/2014   HGB 13.8 12/29/2014   HCT 43.3 12/29/2014   MCV 88 12/29/2014   PLT 277 12/29/2014     Chemistry      Component Value Date/Time   NA 144 12/29/2014  0758   NA 142 11/17/2014 0855   K 4.9 12/29/2014 0758   K 4.1 11/17/2014 0855   CL 109 11/17/2014 0855   CO2 27 12/29/2014 0758   CO2 27 11/17/2014 0855   BUN 20.4 12/29/2014 0758   BUN 15 11/17/2014 0855   CREATININE 1.1 12/29/2014 0758   CREATININE 0.95 11/17/2014 0855      Component Value Date/Time   CALCIUM 10.1 12/29/2014 0758   CALCIUM 9.2 11/17/2014 0855   ALKPHOS 92 12/29/2014 0758   ALKPHOS 73 11/17/2014 0855   AST 26 12/29/2014 0758   AST 31 11/17/2014 0855   ALT 23 12/29/2014 0758   ALT 22 11/17/2014 0855   BILITOT 0.33 12/29/2014 0758   BILITOT 0.3 11/17/2014 0855      Anesthesia Physical  Anesthesia Plan  ASA: III  Anesthesia Plan: General   Post-op Pain Management:    Induction: Intravenous  Airway Management Planned: Oral ETT and LMA  Additional Equipment:   Intra-op Plan:   Post-operative Plan: Extubation in OR  Informed Consent: I have reviewed the patients History and Physical, chart, labs and discussed the procedure including the risks, benefits and alternatives for the proposed anesthesia with the patient or authorized representative who has indicated his/her understanding and acceptance.   Dental advisory given  Plan Discussed with: CRNA and Surgeon  Anesthesia Plan Comments:         Anesthesia Quick Evaluation

## 2015-01-22 ENCOUNTER — Observation Stay (HOSPITAL_COMMUNITY)
Admission: RE | Admit: 2015-01-22 | Discharge: 2015-01-23 | Disposition: A | Discharge status: Home / Self Care | Payer: Medicare Other | Source: Ambulatory Visit | Attending: Plastic Surgery | Admitting: Plastic Surgery

## 2015-01-22 ENCOUNTER — Ambulatory Visit (HOSPITAL_COMMUNITY): Payer: Medicare Other | Admitting: Certified Registered Nurse Anesthetist

## 2015-01-22 ENCOUNTER — Encounter (HOSPITAL_COMMUNITY)
Admission: RE | Disposition: A | Discharge status: Home / Self Care | Payer: Self-pay | Source: Ambulatory Visit | Attending: Plastic Surgery

## 2015-01-22 ENCOUNTER — Encounter (HOSPITAL_COMMUNITY): Payer: Self-pay | Admitting: General Practice

## 2015-01-22 ENCOUNTER — Other Ambulatory Visit: Payer: Self-pay | Admitting: Plastic Surgery

## 2015-01-22 DIAGNOSIS — Z791 Long term (current) use of non-steroidal anti-inflammatories (NSAID): Secondary | ICD-10-CM | POA: Diagnosis not present

## 2015-01-22 DIAGNOSIS — Z792 Long term (current) use of antibiotics: Secondary | ICD-10-CM | POA: Insufficient documentation

## 2015-01-22 DIAGNOSIS — Z7982 Long term (current) use of aspirin: Secondary | ICD-10-CM | POA: Insufficient documentation

## 2015-01-22 DIAGNOSIS — Z6839 Body mass index (BMI) 39.0-39.9, adult: Secondary | ICD-10-CM | POA: Insufficient documentation

## 2015-01-22 DIAGNOSIS — C50912 Malignant neoplasm of unspecified site of left female breast: Secondary | ICD-10-CM | POA: Diagnosis not present

## 2015-01-22 DIAGNOSIS — Z9013 Acquired absence of bilateral breasts and nipples: Secondary | ICD-10-CM

## 2015-01-22 DIAGNOSIS — M1990 Unspecified osteoarthritis, unspecified site: Secondary | ICD-10-CM | POA: Diagnosis not present

## 2015-01-22 DIAGNOSIS — Z79899 Other long term (current) drug therapy: Secondary | ICD-10-CM | POA: Diagnosis not present

## 2015-01-22 DIAGNOSIS — Z9012 Acquired absence of left breast and nipple: Secondary | ICD-10-CM

## 2015-01-22 DIAGNOSIS — L7634 Postprocedural seroma of skin and subcutaneous tissue following other procedure: Principal | ICD-10-CM | POA: Insufficient documentation

## 2015-01-22 HISTORY — DX: Calculus of kidney: N20.0

## 2015-01-22 HISTORY — DX: Adverse effect of unspecified anesthetic, initial encounter: T41.45XA

## 2015-01-22 HISTORY — PX: INCISION AND DRAINAGE OF WOUND: SHX1803

## 2015-01-22 HISTORY — DX: Other complications of anesthesia, initial encounter: T88.59XA

## 2015-01-22 LAB — BASIC METABOLIC PANEL
Anion gap: 8 (ref 5–15)
Anion gap: 9 (ref 5–15)
BUN: 10 mg/dL (ref 6–20)
BUN: 9 mg/dL (ref 6–20)
CALCIUM: 9.1 mg/dL (ref 8.9–10.3)
CHLORIDE: 108 mmol/L (ref 101–111)
CO2: 25 mmol/L (ref 22–32)
CO2: 26 mmol/L (ref 22–32)
CREATININE: 0.92 mg/dL (ref 0.44–1.00)
Calcium: 9 mg/dL (ref 8.9–10.3)
Chloride: 106 mmol/L (ref 101–111)
Creatinine, Ser: 1.06 mg/dL — ABNORMAL HIGH (ref 0.44–1.00)
GFR calc Af Amer: >60 mL/min (ref >60)
GFR calc Af Amer: 60 mL/min — ABNORMAL LOW (ref >60)
GFR calc non Af Amer: >60 mL/min (ref >60)
GFR, EST NON AFRICAN AMERICAN: 52 mL/min — AB (ref >60)
Glucose, Bld: 89 mg/dL (ref 65–99)
Glucose, Bld: 178 mg/dL — ABNORMAL HIGH (ref 65–99)
Potassium: 4 mmol/L (ref 3.5–5.1)
Potassium: 4 mmol/L (ref 3.5–5.1)
SODIUM: 141 mmol/L (ref 135–145)
SODIUM: 141 mmol/L (ref 135–145)

## 2015-01-22 LAB — CBC
HCT: 42.7 % (ref 36.0–46.0)
Hemoglobin: 13.4 g/dL (ref 12.0–15.0)
MCH: 27.8 pg (ref 26.0–34.0)
MCHC: 31.4 g/dL (ref 30.0–36.0)
MCV: 88.6 fL (ref 78.0–100.0)
PLATELETS: 213 10*3/uL (ref 150–400)
RBC: 4.82 MIL/uL (ref 3.87–5.11)
RDW: 14.6 % (ref 11.5–15.5)
WBC: 8 10*3/uL (ref 4.0–10.5)

## 2015-01-22 SURGERY — IRRIGATION AND DEBRIDEMENT WOUND
Anesthesia: General | Laterality: Left

## 2015-01-22 MED ORDER — PROMETHAZINE HCL 25 MG/ML IJ SOLN: 6.2500 mg | INTRAMUSCULAR | Status: DC | PRN

## 2015-01-22 MED ORDER — GLYCOPYRROLATE 0.2 MG/ML IJ SOLN
INTRAMUSCULAR | Status: AC
  Filled 2015-01-22: qty 1

## 2015-01-22 MED ORDER — SODIUM CHLORIDE 0.9 % IR SOLN
Status: DC | PRN
  Administered 2015-01-22: 500 mL

## 2015-01-22 MED ORDER — PHENYLEPHRINE HCL 10 MG/ML IJ SOLN
INTRAMUSCULAR | Status: DC | PRN
  Administered 2015-01-22: 80 ug via INTRAVENOUS
  Administered 2015-01-22: 120 ug via INTRAVENOUS
  Administered 2015-01-22: 80 ug via INTRAVENOUS
  Administered 2015-01-22: 240 ug via INTRAVENOUS
  Administered 2015-01-22: 160 ug via INTRAVENOUS
  Administered 2015-01-22 (×3): 80 ug via INTRAVENOUS

## 2015-01-22 MED ORDER — MIDAZOLAM HCL 5 MG/5ML IJ SOLN
INTRAMUSCULAR | Status: DC | PRN
  Administered 2015-01-22: 2 mg via INTRAVENOUS

## 2015-01-22 MED ORDER — DIAZEPAM 2 MG PO TABS: 2.0000 mg | ORAL_TABLET | Freq: Two times a day (BID) | ORAL | Status: DC | PRN

## 2015-01-22 MED ORDER — PROPOFOL 10 MG/ML IV BOLUS
INTRAVENOUS | Status: DC | PRN
  Administered 2015-01-22: 180 mg via INTRAVENOUS

## 2015-01-22 MED ORDER — DIPHENHYDRAMINE HCL 50 MG/ML IJ SOLN: 12.5000 mg | Freq: Four times a day (QID) | INTRAMUSCULAR | Status: DC | PRN

## 2015-01-22 MED ORDER — EPHEDRINE SULFATE 50 MG/ML IJ SOLN
INTRAMUSCULAR | Status: DC | PRN
  Administered 2015-01-22 (×2): 10 mg via INTRAVENOUS
  Administered 2015-01-22: 20 mg via INTRAVENOUS
  Administered 2015-01-22: 10 mg via INTRAVENOUS

## 2015-01-22 MED ORDER — ONDANSETRON HCL 4 MG/2ML IJ SOLN
INTRAMUSCULAR | Status: DC | PRN
  Administered 2015-01-22: 4 mg via INTRAVENOUS

## 2015-01-22 MED ORDER — HYDROCODONE-ACETAMINOPHEN 5-325 MG PO TABS: 1.0000 | ORAL_TABLET | ORAL | Status: DC | PRN

## 2015-01-22 MED ORDER — ACETAMINOPHEN 500 MG PO TABS
1000.0000 mg | ORAL_TABLET | Freq: Four times a day (QID) | ORAL | Status: DC
  Administered 2015-01-22 – 2015-01-23 (×4): 1000 mg via ORAL
  Filled 2015-01-22 (×5): qty 2

## 2015-01-22 MED ORDER — ROCURONIUM BROMIDE 50 MG/5ML IV SOLN
INTRAVENOUS | Status: AC
  Filled 2015-01-22: qty 1

## 2015-01-22 MED ORDER — GLYCOPYRROLATE 0.2 MG/ML IJ SOLN
INTRAMUSCULAR | Status: DC | PRN
  Administered 2015-01-22: 0.2 mg via INTRAVENOUS

## 2015-01-22 MED ORDER — HYDROMORPHONE HCL 1 MG/ML IJ SOLN: 0.2500 mg | INTRAMUSCULAR | Status: DC | PRN

## 2015-01-22 MED ORDER — SUCCINYLCHOLINE CHLORIDE 20 MG/ML IJ SOLN
INTRAMUSCULAR | Status: AC
  Filled 2015-01-22: qty 1

## 2015-01-22 MED ORDER — LIDOCAINE HCL (CARDIAC) 20 MG/ML IV SOLN
INTRAVENOUS | Status: AC
  Filled 2015-01-22: qty 5

## 2015-01-22 MED ORDER — MIDAZOLAM HCL 2 MG/2ML IJ SOLN
INTRAMUSCULAR | Status: AC
  Filled 2015-01-22: qty 2

## 2015-01-22 MED ORDER — NAPROXEN 250 MG PO TABS: 500.0000 mg | ORAL_TABLET | Freq: Two times a day (BID) | ORAL | Status: DC | PRN

## 2015-01-22 MED ORDER — SODIUM CHLORIDE 0.9 % IR SOLN
Status: DC | PRN
  Administered 2015-01-22 (×2): 1000 mL

## 2015-01-22 MED ORDER — ZINC SULFATE 220 (50 ZN) MG PO CAPS
220.0000 mg | ORAL_CAPSULE | Freq: Every day | ORAL | Status: DC
  Administered 2015-01-23: 220 mg via ORAL
  Filled 2015-01-22: qty 1

## 2015-01-22 MED ORDER — SODIUM CHLORIDE 0.9 % IV SOLN
3.0000 g | Freq: Four times a day (QID) | INTRAVENOUS | Status: DC
  Administered 2015-01-22 – 2015-01-23 (×4): 3 g via INTRAVENOUS
  Filled 2015-01-22 (×6): qty 3

## 2015-01-22 MED ORDER — ONDANSETRON 4 MG PO TBDP: 4.0000 mg | ORAL_TABLET | Freq: Four times a day (QID) | ORAL | Status: DC | PRN

## 2015-01-22 MED ORDER — LIDOCAINE HCL (CARDIAC) 20 MG/ML IV SOLN
INTRAVENOUS | Status: DC | PRN
  Administered 2015-01-22: 60 mg via INTRAVENOUS

## 2015-01-22 MED ORDER — FENTANYL CITRATE (PF) 100 MCG/2ML IJ SOLN
INTRAMUSCULAR | Status: DC | PRN
  Administered 2015-01-22 (×4): 25 ug via INTRAVENOUS
  Administered 2015-01-22: 50 ug via INTRAVENOUS

## 2015-01-22 MED ORDER — VITAMIN C 500 MG PO TABS
500.0000 mg | ORAL_TABLET | Freq: Two times a day (BID) | ORAL | Status: DC
  Administered 2015-01-22 – 2015-01-23 (×2): 500 mg via ORAL
  Filled 2015-01-22 (×3): qty 1

## 2015-01-22 MED ORDER — FENTANYL CITRATE (PF) 250 MCG/5ML IJ SOLN
INTRAMUSCULAR | Status: AC
  Filled 2015-01-22: qty 5

## 2015-01-22 MED ORDER — SCOPOLAMINE 1 MG/3DAYS TD PT72
MEDICATED_PATCH | TRANSDERMAL | Status: DC | PRN
  Administered 2015-01-22: 1 via TRANSDERMAL

## 2015-01-22 MED ORDER — DEXAMETHASONE SODIUM PHOSPHATE 10 MG/ML IJ SOLN
INTRAMUSCULAR | Status: DC | PRN
  Administered 2015-01-22: 10 mg via INTRAVENOUS

## 2015-01-22 MED ORDER — ONDANSETRON HCL 4 MG/2ML IJ SOLN
INTRAMUSCULAR | Status: AC
  Filled 2015-01-22: qty 2

## 2015-01-22 MED ORDER — CEFAZOLIN SODIUM-DEXTROSE 2-3 GM-% IV SOLR: 2.0000 g | INTRAVENOUS | Status: DC

## 2015-01-22 MED ORDER — PROPOFOL 10 MG/ML IV BOLUS
INTRAVENOUS | Status: AC
  Filled 2015-01-22: qty 40

## 2015-01-22 MED ORDER — EPHEDRINE SULFATE 50 MG/ML IJ SOLN
INTRAMUSCULAR | Status: AC
  Filled 2015-01-22: qty 1

## 2015-01-22 MED ORDER — ZINC 50 MG PO CAPS: 1.0000 | ORAL_CAPSULE | Freq: Every day | ORAL | Status: DC

## 2015-01-22 MED ORDER — KCL IN DEXTROSE-NACL 20-5-0.45 MEQ/L-%-% IV SOLN
INTRAVENOUS | Status: DC
  Administered 2015-01-22: 14:00:00 via INTRAVENOUS
  Filled 2015-01-22 (×2): qty 1000

## 2015-01-22 MED ORDER — SODIUM CHLORIDE 0.9 % IJ SOLN
INTRAMUSCULAR | Status: AC
  Filled 2015-01-22: qty 10

## 2015-01-22 MED ORDER — DIPHENHYDRAMINE HCL 12.5 MG/5ML PO ELIX: 12.5000 mg | ORAL_SOLUTION | Freq: Four times a day (QID) | ORAL | Status: DC | PRN

## 2015-01-22 MED ORDER — MORPHINE SULFATE (PF) 2 MG/ML IV SOLN: 2.0000 mg | INTRAVENOUS | Status: DC | PRN

## 2015-01-22 MED ORDER — LACTATED RINGERS IV SOLN
INTRAVENOUS | Status: DC | PRN
  Administered 2015-01-22 (×2): via INTRAVENOUS

## 2015-01-22 MED ORDER — DEXAMETHASONE SODIUM PHOSPHATE 10 MG/ML IJ SOLN
INTRAMUSCULAR | Status: AC
  Filled 2015-01-22: qty 1

## 2015-01-22 MED ORDER — CEFAZOLIN SODIUM-DEXTROSE 2-3 GM-% IV SOLR
INTRAVENOUS | Status: DC | PRN
  Administered 2015-01-22: 2 g via INTRAVENOUS

## 2015-01-22 MED ORDER — ONDANSETRON HCL 4 MG/2ML IJ SOLN
4.0000 mg | Freq: Four times a day (QID) | INTRAMUSCULAR | Status: DC | PRN
Start: 2015-01-22 — End: 2015-01-23

## 2015-01-22 MED ORDER — POLYETHYLENE GLYCOL 3350 17 G PO PACK
17.0000 g | PACK | Freq: Every day | ORAL | Status: DC
  Filled 2015-01-22: qty 1

## 2015-01-22 SURGICAL SUPPLY — 59 items
ADH SKN CLS APL DERMABOND .7 (GAUZE/BANDAGES/DRESSINGS) ×1
APL SKNCLS STERI-STRIP NONHPOA (GAUZE/BANDAGES/DRESSINGS) ×1
BAG DECANTER FOR FLEXI CONT (MISCELLANEOUS) IMPLANT
BENZOIN TINCTURE PRP APPL 2/3 (GAUZE/BANDAGES/DRESSINGS) ×3 IMPLANT
BINDER BREAST XLRG (GAUZE/BANDAGES/DRESSINGS) ×2 IMPLANT
BIOPATCH WHT 1IN DISK W/4.0 H (GAUZE/BANDAGES/DRESSINGS) ×2 IMPLANT
CANISTER SUCTION 2500CC (MISCELLANEOUS) ×3 IMPLANT
CONT SPEC STER OR (MISCELLANEOUS) IMPLANT
COVER SURGICAL LIGHT HANDLE (MISCELLANEOUS) ×3 IMPLANT
DERMABOND ADVANCED (GAUZE/BANDAGES/DRESSINGS) ×2
DERMABOND ADVANCED .7 DNX12 (GAUZE/BANDAGES/DRESSINGS) IMPLANT
DRAIN CHANNEL 19F RND (DRAIN) ×2 IMPLANT
DRAPE CHEST BREAST 15X10 FENES (DRAPES) ×2 IMPLANT
DRAPE IMP U-DRAPE 54X76 (DRAPES) ×1 IMPLANT
DRAPE INCISE IOBAN 66X45 STRL (DRAPES) IMPLANT
DRAPE LAPAROSCOPIC ABDOMINAL (DRAPES) IMPLANT
DRAPE PED LAPAROTOMY (DRAPES) ×1 IMPLANT
DRAPE PROXIMA HALF (DRAPES) IMPLANT
DRSG ADAPTIC 3X8 NADH LF (GAUZE/BANDAGES/DRESSINGS) IMPLANT
DRSG PAD ABDOMINAL 8X10 ST (GAUZE/BANDAGES/DRESSINGS) ×2 IMPLANT
DRSG VAC ATS LRG SENSATRAC (GAUZE/BANDAGES/DRESSINGS) IMPLANT
DRSG VAC ATS MED SENSATRAC (GAUZE/BANDAGES/DRESSINGS) IMPLANT
DRSG VAC ATS SM SENSATRAC (GAUZE/BANDAGES/DRESSINGS) IMPLANT
ELECT BLADE 4.0 EZ CLEAN MEGAD (MISCELLANEOUS) ×3
ELECT CAUTERY BLADE 6.4 (BLADE) ×2 IMPLANT
ELECT REM PT RETURN 9FT ADLT (ELECTROSURGICAL) ×3
ELECTRODE BLDE 4.0 EZ CLN MEGD (MISCELLANEOUS) IMPLANT
ELECTRODE REM PT RTRN 9FT ADLT (ELECTROSURGICAL) ×1 IMPLANT
EVACUATOR SILICONE 100CC (DRAIN) ×2 IMPLANT
GAUZE SPONGE 4X4 12PLY STRL (GAUZE/BANDAGES/DRESSINGS) ×2 IMPLANT
GLOVE BIO SURGEON STRL SZ 6.5 (GLOVE) ×2 IMPLANT
GLOVE BIO SURGEONS STRL SZ 6.5 (GLOVE) ×1
GOWN STRL REUS W/ TWL LRG LVL3 (GOWN DISPOSABLE) ×3 IMPLANT
GOWN STRL REUS W/TWL LRG LVL3 (GOWN DISPOSABLE) ×9
GRAFT FLEX HD 4X16 THICK (Tissue Mesh) ×2 IMPLANT
IMPL BREAST TIS EXP M 350CC (Breast) IMPLANT
IMPLANT BREAST TIS EXP M 350CC (Breast) ×3 IMPLANT
KIT BASIN OR (CUSTOM PROCEDURE TRAY) ×3 IMPLANT
KIT ROOM TURNOVER OR (KITS) ×3 IMPLANT
NS IRRIG 1000ML POUR BTL (IV SOLUTION) ×3 IMPLANT
PACK GENERAL/GYN (CUSTOM PROCEDURE TRAY) ×3 IMPLANT
PACK UNIVERSAL I (CUSTOM PROCEDURE TRAY) ×1 IMPLANT
PAD ARMBOARD 7.5X6 YLW CONV (MISCELLANEOUS) ×6 IMPLANT
PIN SAFETY STERILE (MISCELLANEOUS) ×2 IMPLANT
SET ASEPTIC TRANSFER (MISCELLANEOUS) ×2 IMPLANT
SPONGE LAP 18X18 X RAY DECT (DISPOSABLE) ×4 IMPLANT
STAPLER VISISTAT 35W (STAPLE) ×1 IMPLANT
SURGILUBE 2OZ TUBE FLIPTOP (MISCELLANEOUS) IMPLANT
SUT MNCRL AB 4-0 PS2 18 (SUTURE) ×6 IMPLANT
SUT MON AB 3-0 SH 27 (SUTURE) ×3
SUT MON AB 3-0 SH27 (SUTURE) IMPLANT
SUT MON AB 5-0 PS2 18 (SUTURE) ×2 IMPLANT
SUT PDS AB 2-0 CT1 27 (SUTURE) ×4 IMPLANT
SUT SILK 2 0 SH (SUTURE) ×2 IMPLANT
SUT VIC AB 5-0 PS2 18 (SUTURE) IMPLANT
SWAB COLLECTION DEVICE MRSA (MISCELLANEOUS) ×2 IMPLANT
TOWEL OR 17X26 10 PK STRL BLUE (TOWEL DISPOSABLE) ×3 IMPLANT
TUBE ANAEROBIC SPECIMEN COL (MISCELLANEOUS) ×2 IMPLANT
UNDERPAD 30X30 INCONTINENT (UNDERPADS AND DIAPERS) ×1 IMPLANT

## 2015-01-22 NOTE — Brief Op Note (Signed)
01/22/2015  9:40 AM  PATIENT:  Kimberly [name]  74 y.o. female  PRE-OPERATIVE DIAGNOSIS:  left breast seroma after tissue expander placement  POST-OPERATIVE DIAGNOSIS:  left breast seroma after tissue expander placement  PROCEDURE:  Procedure(s): IRRIGATION AND DEBRIDEMENT OF LEFT BREAST POCKET WITH REMOVAL TISSUE EXPANDER AND REPLACEMENT EXPANDER, POSSIBLE ACELLULAR DERMAL MATRIX PLACEMENT (Left)  SURGEON:  Surgeon(s) and Role:    * Darenda Fike S Kyandre Okray, DO - Primary  PHYSICIAN ASSISTANT: Shawn Rayburn, PA  ASSISTANTS: none   ANESTHESIA:   general  EBL:  Total I/O In: 1000 [I.V.:1000] Out: 50 [Blood:50]  BLOOD ADMINISTERED:none  DRAINS: (1) Jackson-Pratt drain(s) with closed bulb suction in the left breast pocket   LOCAL MEDICATIONS USED:  NONE  SPECIMEN:  Source of Specimen:  left breast tissue, capsule and fluid  DISPOSITION OF SPECIMEN:  micro and path  COUNTS:  YES  TOURNIQUET:  * No tourniquets in log *  DICTATION: .Dragon Dictation  PLAN OF CARE: Admit for overnight observation  PATIENT DISPOSITION:  PACU - hemodynamically stable.   Delay start of Pharmacological VTE agent (>24hrs) due to surgical blood loss or risk of bleeding: no

## 2015-01-22 NOTE — H&P (Signed)
Kimberly [name] is an 75 y.o. female.   Chief Complaint: left breast seroma HPI: The patient is a 74 yrs old wf here for removal of the expander and seroma.  She has excess skin which is likely contributing to the seroma formation.  We had discussed that it could be lymph fluid as well.  She underwent a mastectomy with immediate reconstruction and has developed seromas since.  Past Medical History  Diagnosis Date  . Arthritis   . Seizures (HCC)     hx of one seizure in 1963--no known reason for the seizure and none since  . DDD (degenerative disc disease), lumbar   . Breast cancer ([place] 09/10/14    Right Breast  . Seroma     left breast  . Kidney stones   . Complication of anesthesia     has issues with inner ear, usually gets patch behind ear and use nausea medicine to prevent inner ear dizziness    Past Surgical History  Procedure Laterality Date  . Cholecystectomy    . Lithotripsy    . Tubal ligation    . Breast surgery  1997    RIGHT BREAST BIOPSY  . Total knee arthroplasty Right 05/14/2012    Procedure: TOTAL KNEE ARTHROPLASTY;  Surgeon: Kimberly Alf, MD;  Location: [facility];  Service: Orthopedics;  Laterality: Right;  . Mastectomy w/ sentinel node biopsy Left 11/20/2014    Procedure: MASTECTOMY WITH SENTINEL LYMPH NODE BIOPSY;  Surgeon: Kimberly Overall, MD;  Location: [facility];  Service: General;  Laterality: Left;  . Breast reconstruction with placement of tissue expander and flex hd (acellular hydrated dermis) Left 11/20/2014    Procedure: LEFT BREAST RECONSTRUCTION WITH PLACEMENT OF TISSUE EXPANDER AND FLEX HD (ACELLULAR HYDRATED DERMIS) ;  Surgeon: Kimberly Going, DO;  Location: [facility];  Service: Plastics;  Laterality: Left;  . Incision and drainage of wound Left 12/12/2014    Procedure: LEFT BREAST DRAINAGE OF SEROMA  AND EXCISION OF EXCESS SKIN;  Surgeon: Kimberly Going, DO;  Location: [facility];  Service: Plastics;   Laterality: Left;  . Colonoscopy      Family History  Problem Relation Age of Onset  . Breast cancer Maternal Aunt   . Cancer Son     ewing's sarcoma  . Lung cancer Maternal Aunt   . Lung cancer Father   . Arthritis Mother   . Pneumonia Mother   . Congestive Heart Failure Mother   . Transient ischemic attack Mother    Social History:  reports that she has never smoked. She has never used smokeless tobacco. She reports that she does not drink alcohol or use illicit drugs.  Allergies:  Allergies  Allergen Reactions  . Codeine Nausea And Vomiting    Medications Prior to Admission  Medication Sig Dispense Refill  . Ascorbic Acid (VITAMIN C) 1000 MG tablet Take by mouth.    Marland Kitchen aspirin EC 81 MG tablet Take by mouth.    . Calcium Carbonate (CALCIUM 600 PO) Take 1 tablet by mouth daily.    . Cholecalciferol (D 2000) 2000 UNITS TABS Take by mouth.    . ciprofloxacin (CIPRO) 500 MG tablet Take 500 mg by mouth 2 (two) times daily.    . Glucosamine-Chondroit-Vit C-Mn (GLUCOSAMINE 1500 COMPLEX PO) Take 1 tablet by mouth daily.    Marland Kitchen letrozole (FEMARA) 2.5 MG tablet Take 1 tablet (2.5 mg total) by mouth daily. 30 tablet 12  . loratadine (CLARITIN)  10 MG tablet Take 10 mg by mouth daily.    . Multiple Vitamin (MULTI-VITAMINS) TABS Take by mouth.    . naproxen sodium (RA NAPROXEN SODIUM) 220 MG tablet Take by mouth.    . pyridOXINE (B-6) 50 MG tablet Take by mouth.    . TURMERIC PO Take 500 mg by mouth daily.    . Zinc 50 MG CAPS Take 1 capsule by mouth daily.    Marland Kitchen acetaminophen (TYLENOL) 650 MG CR tablet Take 1,300 mg by mouth. Takes 2 every am and 2 every pm      No results found for this or any previous visit (from the past 48 hour(s)). No results found.  Review of Systems  Constitutional: Negative.   HENT: Negative.   Eyes: Negative.   Respiratory: Negative.   Cardiovascular: Negative.   Gastrointestinal: Negative.   Genitourinary: Negative.   Musculoskeletal: Negative.    Skin: Negative.   Neurological: Negative.   Psychiatric/Behavioral: Negative.     Blood pressure 107/74, pulse 72, temperature 97.7 F (36.5 C), temperature source Oral, resp. rate 18, height 5\' 4"  (1.626 m), weight 104.781 kg (231 lb), SpO2 98 %. Physical Exam  Constitutional: She is oriented to person, place, and time. She appears well-developed and well-nourished.  HENT:  Head: Normocephalic and atraumatic.  Eyes: Pupils are equal, round, and reactive to light.  Cardiovascular: Normal rate.   Respiratory: Effort normal.  GI: Soft.  Neurological: She is alert and oriented to person, place, and time.  Psychiatric: She has a normal mood and affect. Her behavior is normal. Judgment and thought content normal.     Assessment/Plan Left expander removal with placement of Flex HD and expander.  Kimberly [name] 01/22/2015, 7:28 AM

## 2015-01-22 NOTE — Anesthesia Postprocedure Evaluation (Signed)
Anesthesia Post Note  Patient: Kimberly [name]  Procedure(s) Performed: Procedure(s) (LRB): IRRIGATION AND DEBRIDEMENT OF LEFT BREAST POCKET WITH REMOVAL TISSUE EXPANDER AND REPLACEMENT EXPANDER, POSSIBLE ACELLULAR DERMAL MATRIX PLACEMENT (Left)  Patient location during evaluation: PACU Anesthesia Type: General Level of consciousness: awake and alert Pain management: pain level controlled Vital Signs Assessment: post-procedure vital signs reviewed and stable Respiratory status: spontaneous breathing, nonlabored ventilation, respiratory function stable and patient connected to nasal cannula oxygen Cardiovascular status: blood pressure returned to baseline and stable Postop Assessment: no signs of nausea or vomiting Anesthetic complications: no    Last Vitals:  Filed Vitals:   01/22/15 1021 01/22/15 1030  BP: 116/73   Pulse: 85   Temp:  36.2 C  Resp: 16     Last Pain:  Filed Vitals:   01/22/15 1034  PainSc: 0-No pain                 Ajee Heasley S

## 2015-01-22 NOTE — Op Note (Signed)
Op report    DATE OF OPERATION:  01/22/2015  LOCATION: [facility] outpatient  SURGICAL DIVISION: Plastic Surgery  PREOPERATIVE DIAGNOSES:  1. Left breast seroma 2. Acquired absence of left breast 3. Breast cancer.    POSTOPERATIVE DIAGNOSES:  1. Left breast seroma 2. Acquired absence of left breast 3. Breast cancer.    PROCEDURE:  1. Drainage of seroma. 2. Excision of Flex HD and removal of expander. 3. Left breast reconstruction with placement of Acellular Dermal Matrix and tissue expander.  SURGEON: Theodoro Kos, DO  ASSISTANT: Shawn Rayburn, PA  ANESTHESIA:  General.   COMPLICATIONS: None.   IMPLANTS: Left - Mentor 450 cc placed Acellular Dermal Matrix 4 x 16 cm  INDICATIONS FOR PROCEDURE:  The patient, Kimberly [name], is a 74 y.o. female born on June 12, 1940, is here for breast reconstruction with placement of a left tissue expander and Acellular dermal matrix.  The patient has been collecting a seroma and has excess skin.  The decision was made for removal of the expander and flexHd. MRN: [id number]  CONSENT:  Informed consent was obtained directly from the patient. Risks, benefits and alternatives were fully discussed. Specific risks including but not limited to bleeding, infection, hematoma, seroma, scarring, pain, implant infection, implant extrusion, capsular contracture, asymmetry, wound healing problems, and need for further surgery were all discussed. The patient did have an ample opportunity to have her questions answered to her satisfaction.   DESCRIPTION OF PROCEDURE:  The patient was taken to the operating room. SCDs were placed and IV antibiotics were given. The patient's chest was prepped and draped in a sterile fashion. A time out was performed and the implants to be used were identified.  An incision was made at the old incision site to include the excess skin.  The pocket was irrigated with antibiotic solution and hemostasis was achieved with  electrocautery. The FlexHD was excised and the expander removed. The capsule was excised from the superior and inferior flap and lateral flap. The ADM was then prepared according to the manufacture guidelines and slits placed to help with postoperative fluid management.  The ADM was then sutured to the inferior and lateral edge of the inframammary fold with 2-0 PDS starting with an interrupted stitch and then a running stitch.  The lateral portion was sutured to with interrupted sutures after the expander was placed.  The expander was prepared according to the manufacture guidelines, the air evacuated and then it was placed under the ADM and pectoralis major muscle.  The inferior and lateral tabs were used to secure the expander to the chest wall with 2-0 PDS.  The drain was placed at the inframammary fold over the ADM and secured to the skin with 3-0 Silk.  The tissue was sent to pathology and micro samples sent as well. The deep layers were closed with 3-0 Vicryl followed by 4-0 Monocryl.  The skin was closed with 5-0 Monocryl and then dermabond was applied.  The ABDs and breast binder were placed.  The patient tolerated the procedure well and there were no complications.  The patient was allowed to wake from anesthesia and taken to the recovery room in satisfactory condition.

## 2015-01-22 NOTE — Anesthesia Procedure Notes (Signed)
Procedure Name: LMA Insertion Date/Time: 01/22/2015 7:36 AM Performed by: Layla Maw Pre-anesthesia Checklist: Patient identified, Patient being monitored, Timeout performed, Emergency Drugs available and Suction available Patient Re-evaluated:Patient Re-evaluated prior to inductionOxygen Delivery Method: Circle System Utilized Preoxygenation: Pre-oxygenation with 100% oxygen Intubation Type: IV induction Ventilation: Mask ventilation without difficulty LMA: LMA inserted LMA Size: 4.0 Number of attempts: 1 Placement Confirmation: positive ETCO2 and breath sounds checked- equal and bilateral Tube secured with: Tape Dental Injury: Teeth and Oropharynx as per pre-operative assessment

## 2015-01-22 NOTE — Transfer of Care (Signed)
Immediate Anesthesia Transfer of Care Note  Patient: Kimberly [name]  Procedure(s) Performed: Procedure(s): IRRIGATION AND DEBRIDEMENT OF LEFT BREAST POCKET WITH REMOVAL TISSUE EXPANDER AND REPLACEMENT EXPANDER, POSSIBLE ACELLULAR DERMAL MATRIX PLACEMENT (Left)  Patient Location: PACU  Anesthesia Type:General  Level of Consciousness: awake, alert , oriented and patient cooperative  Airway & Oxygen Therapy: Patient Spontanous Breathing and Patient connected to nasal cannula oxygen  Post-op Assessment: Report given to RN, Post -op Vital signs reviewed and stable and Patient moving all extremities X 4  Post vital signs: Reviewed and stable  Last Vitals:  Filed Vitals:   01/22/15 0641  BP: 107/74  Pulse: 72  Temp: 36.5 C  Resp: 18    Complications: No apparent anesthesia complications

## 2015-01-23 ENCOUNTER — Encounter (HOSPITAL_COMMUNITY): Payer: Self-pay | Admitting: Plastic Surgery

## 2015-01-23 DIAGNOSIS — L7634 Postprocedural seroma of skin and subcutaneous tissue following other procedure: Secondary | ICD-10-CM | POA: Diagnosis not present

## 2015-01-23 NOTE — Progress Notes (Signed)
NURSING PROGRESS NOTE  Kimberly [name] [id number] Discharge Data: 01/23/2015 11:08 AM Attending Provider: [name], DO [id number]., MD     Elyse Hsu to be D/C'd Home per MD order.  Discussed with the patient the After Visit Summary and all questions fully answered. All IV's discontinued with no bleeding noted. All belongings returned to patient for patient to take home.   Last Vital Signs:  Blood pressure 91/50, pulse 64, temperature 97.7 F (36.5 C), temperature source Oral, resp. rate 16, height 5\' 4"  (1.626 m), weight 104.7 kg (230 lb 13.2 oz), SpO2 96 %.  Discharge Medication List   Medication List    TAKE these medications        acetaminophen 650 MG CR tablet  Commonly known as:  TYLENOL  Take 1,300 mg by mouth. Takes 2 every am and 2 every pm     aspirin EC 81 MG tablet  Take by mouth.     CALCIUM 600 PO  Take 1 tablet by mouth daily.     ciprofloxacin 500 MG tablet  Commonly known as:  CIPRO  Take 500 mg by mouth 2 (two) times daily.     D 2000 2000 UNITS Tabs  Generic drug:  Cholecalciferol  Take by mouth.     GLUCOSAMINE 1500 COMPLEX PO  Take 1 tablet by mouth daily.     letrozole 2.5 MG tablet  Commonly known as:  FEMARA  Take 1 tablet (2.5 mg total) by mouth daily.     loratadine 10 MG tablet  Commonly known as:  CLARITIN  Take 10 mg by mouth daily.     MULTI-VITAMINS Tabs  Take by mouth.     pyridOXINE 50 MG tablet  Commonly known as:  B-6  Take by mouth.     RA NAPROXEN SODIUM 220 MG tablet  Generic drug:  naproxen sodium  Take by mouth.     TURMERIC PO  Take 500 mg by mouth daily.     vitamin C 1000 MG tablet  Take by mouth.     Zinc 50 MG Caps  Take 1 capsule by mouth daily.

## 2015-01-23 NOTE — Progress Notes (Signed)
Patient and husband were concerned about d/c planning. Spoke with Fabio Asa Rayburn, PA-C. She entered d/c order and informed me to make the patient aware that the Cipro prescription has been called into the patient's pharmacy. Patient was notified. Shawn also had RN inform the patient if they had any questions or concerns, they were free to stop by the office to see her.

## 2015-01-25 LAB — WOUND CULTURE

## 2015-01-26 NOTE — Discharge Summary (Signed)
Physician Discharge Summary  Patient ID: Kimberly [name] MRN: [id number] DOB/AGE: 72-23-1942 72 y.o.  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 She had excess skin which was likely contributing to the seroma formation. We had discussed that it could be lymph fluid as well. She underwent a left mastectomy with immediate reconstruction and had developed recurrent seroma. She was taken to the OR for removal of the left breast tissue expander and ADM as well as capsulotomy and replacement with new expander and ADM. She tolerated this well. She was observed overnight and did well and was discharged the following day. She will follow up in the office next week.   Consults: None  Significant Diagnostic Studies: microbiology: Operative cultures grew-Serratia and this was sensitive to Bactrim.   Treatments: surgery: Removal of left breast tissue expander, capsulotomy, irrigation of left breast pocket and replacement of left breast tissue expander  Discharge Exam: Blood pressure 91/50, pulse 64, temperature 97.7 F (36.5 C), temperature source Oral, resp. rate 16, height 5\' 4"  (1.626 m), weight 104.7 kg (230 lb 13.2 oz), SpO2 96 %. General appearance: alert, cooperative, appears stated age and no distress Left breast flap is viable, no signs of infection or seroma. JP drainage is serosanguinous  Disposition: 01-Home or Self Care     Medication List    TAKE these medications        acetaminophen 650 MG CR tablet  Commonly known as:  TYLENOL  Take 1,300 mg by mouth. Takes 2 every am and 2 every pm     aspirin EC 81 MG tablet  Take by mouth.     CALCIUM  600 PO  Take 1 tablet by mouth daily.     ciprofloxacin 500 MG tablet  Commonly known as:  CIPRO  Take 500 mg by mouth 2 (two) times daily.     D 2000 2000 UNITS Tabs  Generic drug:  Cholecalciferol  Take by mouth.     GLUCOSAMINE 1500 COMPLEX PO  Take 1 tablet by mouth daily.     letrozole 2.5 MG tablet  Commonly known as:  FEMARA  Take 1 tablet (2.5 mg total) by mouth daily.     loratadine 10 MG tablet  Commonly known as:  CLARITIN  Take 10 mg by mouth daily.     MULTI-VITAMINS Tabs  Take by mouth.     pyridOXINE 50 MG tablet  Commonly known as:  B-6  Take by mouth.     RA NAPROXEN SODIUM 220 MG tablet  Generic drug:  naproxen sodium  Take by mouth.     TURMERIC PO  Take 500 mg by mouth daily.     vitamin C 1000 MG tablet  Take by mouth.     Zinc 50 MG Caps  Take 1 capsule by mouth daily.           Follow-up Information    Follow up with [name], DO On 01/27/2015.   Specialty:  Plastic Surgery   Why:  follow up next Tuesday at 10:40 am   Contact information:   [place] [address] [phone]  SignedUlysees Barns [facility] [phone]

## 2015-01-27 LAB — ANAEROBIC CULTURE

## 2015-02-02 ENCOUNTER — Encounter: Payer: Self-pay | Admitting: Hematology & Oncology

## 2015-02-02 ENCOUNTER — Other Ambulatory Visit (HOSPITAL_BASED_OUTPATIENT_CLINIC_OR_DEPARTMENT_OTHER): Payer: Medicare Other

## 2015-02-02 ENCOUNTER — Ambulatory Visit (HOSPITAL_BASED_OUTPATIENT_CLINIC_OR_DEPARTMENT_OTHER): Payer: Medicare Other | Admitting: Hematology & Oncology

## 2015-02-02 ENCOUNTER — Ambulatory Visit (HOSPITAL_BASED_OUTPATIENT_CLINIC_OR_DEPARTMENT_OTHER): Payer: Medicare Other

## 2015-02-02 VITALS — BP 120/75 | HR 83 | Temp 98.0°F | Resp 16 | Ht 64.0 in

## 2015-02-02 DIAGNOSIS — Z79811 Long term (current) use of aromatase inhibitors: Secondary | ICD-10-CM

## 2015-02-02 DIAGNOSIS — C50412 Malignant neoplasm of upper-outer quadrant of left female breast: Secondary | ICD-10-CM | POA: Diagnosis present

## 2015-02-02 DIAGNOSIS — T386X5A Adverse effect of antigonadotrophins, antiestrogens, antiandrogens, not elsewhere classified, initial encounter: Secondary | ICD-10-CM

## 2015-02-02 DIAGNOSIS — Z780 Asymptomatic menopausal state: Secondary | ICD-10-CM

## 2015-02-02 DIAGNOSIS — C50112 Malignant neoplasm of central portion of left female breast: Secondary | ICD-10-CM

## 2015-02-02 DIAGNOSIS — E559 Vitamin D deficiency, unspecified: Secondary | ICD-10-CM

## 2015-02-02 DIAGNOSIS — M818 Other osteoporosis without current pathological fracture: Secondary | ICD-10-CM

## 2015-02-02 DIAGNOSIS — C50212 Malignant neoplasm of upper-inner quadrant of left female breast: Secondary | ICD-10-CM | POA: Diagnosis present

## 2015-02-02 DIAGNOSIS — Z170 Estrogen receptor positive status [ER+]: Secondary | ICD-10-CM

## 2015-02-02 DIAGNOSIS — C50012 Malignant neoplasm of nipple and areola, left female breast: Secondary | ICD-10-CM

## 2015-02-02 LAB — CBC WITH DIFFERENTIAL (CANCER CENTER ONLY)
BASO#: 0.1 10*3/uL (ref 0.0–0.2)
BASO%: 1.3 % (ref 0.0–2.0)
EOS ABS: 0.8 10*3/uL — AB (ref 0.0–0.5)
EOS%: 11.7 % — ABNORMAL HIGH (ref 0.0–7.0)
HEMATOCRIT: 42.8 % (ref 34.8–46.6)
HEMOGLOBIN: 13.6 g/dL (ref 11.6–15.9)
LYMPH#: 2 10*3/uL (ref 0.9–3.3)
LYMPH%: 28.1 % (ref 14.0–48.0)
MCH: 27.7 pg (ref 26.0–34.0)
MCHC: 31.8 g/dL — AB (ref 32.0–36.0)
MCV: 87 fL (ref 81–101)
MONO#: 0.9 10*3/uL (ref 0.1–0.9)
MONO%: 12.2 % (ref 0.0–13.0)
NEUT%: 46.7 % (ref 39.6–80.0)
NEUTROS ABS: 3.3 10*3/uL (ref 1.5–6.5)
Platelets: 332 10*3/uL (ref 145–400)
RBC: 4.91 10*6/uL (ref 3.70–5.32)
RDW: 15.1 % (ref 11.1–15.7)
WBC: 7 10*3/uL (ref 3.9–10.0)

## 2015-02-02 LAB — CMP (CANCER CENTER ONLY)
ALBUMIN: 3.6 g/dL (ref 3.3–5.5)
ALT(SGPT): 27 U/L (ref 10–47)
AST: 37 U/L (ref 11–38)
Alkaline Phosphatase: 86 U/L — ABNORMAL HIGH (ref 26–84)
BUN, Bld: 15 mg/dL (ref 7–22)
CALCIUM: 10 mg/dL (ref 8.0–10.3)
CHLORIDE: 104 meq/L (ref 98–108)
CO2: 27 meq/L (ref 18–33)
CREATININE: 1.4 mg/dL — AB (ref 0.6–1.2)
Glucose, Bld: 93 mg/dL (ref 73–118)
POTASSIUM: 4.4 meq/L (ref 3.3–4.7)
SODIUM: 135 meq/L (ref 128–145)
TOTAL PROTEIN: 6.4 g/dL (ref 6.4–8.1)
Total Bilirubin: 0.5 mg/dl (ref 0.20–1.60)

## 2015-02-02 MED ORDER — DENOSUMAB 60 MG/ML ~~LOC~~ SOLN
60.0000 mg | Freq: Once | SUBCUTANEOUS | Status: AC
  Administered 2015-02-02: 60 mg via SUBCUTANEOUS
  Filled 2015-02-02: qty 1

## 2015-02-02 NOTE — Patient Instructions (Signed)
Denosumab injection  What is this medicine?  DENOSUMAB (den oh sue mab) slows bone breakdown. Prolia is used to treat osteoporosis in women after menopause and in men. Xgeva is used to prevent bone fractures and other bone problems caused by cancer bone metastases. Xgeva is also used to treat giant cell tumor of the bone.  This medicine may be used for other purposes; ask your health care provider or pharmacist if you have questions.  What should I tell my health care provider before I take this medicine?  They need to know if you have any of these conditions:  -dental disease  -eczema  -infection or history of infections  -kidney disease or on dialysis  -low blood calcium or vitamin D  -malabsorption syndrome  -scheduled to have surgery or tooth extraction  -taking medicine that contains denosumab  -thyroid or parathyroid disease  -an unusual reaction to denosumab, other medicines, foods, dyes, or preservatives  -pregnant or trying to get pregnant  -breast-feeding  How should I use this medicine?  This medicine is for injection under the skin. It is given by a health care professional in a hospital or clinic setting.  If you are getting Prolia, a special MedGuide will be given to you by the pharmacist with each prescription and refill. Be sure to read this information carefully each time.  For Prolia, talk to your pediatrician regarding the use of this medicine in children. Special care may be needed. For Xgeva, talk to your pediatrician regarding the use of this medicine in children. While this drug may be prescribed for children as young as 13 years for selected conditions, precautions do apply.  Overdosage: If you think you have taken too much of this medicine contact a poison control center or emergency room at once.  NOTE: This medicine is only for you. Do not share this medicine with others.  What if I miss a dose?  It is important not to miss your dose. Call your doctor or health care professional if you are  unable to keep an appointment.  What may interact with this medicine?  Do not take this medicine with any of the following medications:  -other medicines containing denosumab  This medicine may also interact with the following medications:  -medicines that suppress the immune system  -medicines that treat cancer  -steroid medicines like prednisone or cortisone  This list may not describe all possible interactions. Give your health care provider a list of all the medicines, herbs, non-prescription drugs, or dietary supplements you use. Also tell them if you smoke, drink alcohol, or use illegal drugs. Some items may interact with your medicine.  What should I watch for while using this medicine?  Visit your doctor or health care professional for regular checks on your progress. Your doctor or health care professional may order blood tests and other tests to see how you are doing.  Call your doctor or health care professional if you get a cold or other infection while receiving this medicine. Do not treat yourself. This medicine may decrease your body's ability to fight infection.  You should make sure you get enough calcium and vitamin D while you are taking this medicine, unless your doctor tells you not to. Discuss the foods you eat and the vitamins you take with your health care professional.  See your dentist regularly. Brush and floss your teeth as directed. Before you have any dental work done, tell your dentist you are receiving this medicine.  Do   not become pregnant while taking this medicine or for 5 months after stopping it. Women should inform their doctor if they wish to become pregnant or think they might be pregnant. There is a potential for serious side effects to an unborn child. Talk to your health care professional or pharmacist for more information.  What side effects may I notice from receiving this medicine?  Side effects that you should report to your doctor or health care professional as soon as  possible:  -allergic reactions like skin rash, itching or hives, swelling of the face, lips, or tongue  -breathing problems  -chest pain  -fast, irregular heartbeat  -feeling faint or lightheaded, falls  -fever, chills, or any other sign of infection  -muscle spasms, tightening, or twitches  -numbness or tingling  -skin blisters or bumps, or is dry, peels, or red  -slow healing or unexplained pain in the mouth or jaw  -unusual bleeding or bruising  Side effects that usually do not require medical attention (Report these to your doctor or health care professional if they continue or are bothersome.):  -muscle pain  -stomach upset, gas  This list may not describe all possible side effects. Call your doctor for medical advice about side effects. You may report side effects to FDA at 1-800-FDA-1088.  Where should I keep my medicine?  This medicine is only given in a clinic, doctor's office, or other health care setting and will not be stored at home.  NOTE: This sheet is a summary. It may not cover all possible information. If you have questions about this medicine, talk to your doctor, pharmacist, or health care provider.      2016, [employer]vier/Gold Standard. (2011-08-08 12:37:47)

## 2015-02-02 NOTE — Progress Notes (Signed)
Hematology and Oncology Follow Up Visit  Kimberly [name] [id number] [birth year] 74 y.o. 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  Oncotype score is 7  Current Therapy:    Femara 2.5 mg by mouth daily  Prolia 60 mg subcutaneous every 6 months-first dose today     Interim History:  Ms. Kimberly [name] is back for follow-up. She was hospitalized recently at the having more surgery to help with drainage from the left mastectomy and implant. She still has a drain in. She sees the Psychiatric nurse tomorrow.  She has not is in the radiation oncologist. We are not sure as to when she is going to start radiation.  She's doing well with the Femara. She's had no arthropathy. There's been no fatigue.  She has had no problems with rashes. There's been no nausea or vomiting. She's had no change in bowel or bladder habits.  Overall, her performance status is ECOG 1.  Medications:  Current outpatient prescriptions:  .  acetaminophen (TYLENOL) 650 MG CR tablet, Take 1,300 mg by mouth. Takes 2 every am and 2 every pm, Disp: , Rfl:  .  Ascorbic Acid (VITAMIN C) 1000 MG tablet, Take by mouth., Disp: , Rfl:  .  aspirin EC 81 MG tablet, Take by mouth., Disp: , Rfl:  .  Calcium Carbonate (CALCIUM 600 PO), Take 1 tablet by mouth daily., Disp: , Rfl:  .  Cholecalciferol (D 2000) 2000 UNITS TABS, Take by mouth., Disp: , Rfl:  .  Glucosamine-Chondroit-Vit C-Mn (GLUCOSAMINE 1500 COMPLEX PO), Take 1 tablet by mouth daily., Disp: , Rfl:  .  letrozole (FEMARA) 2.5 MG tablet, Take 1 tablet (2.5 mg total) by mouth daily., Disp: 30 tablet, Rfl: 12 .  loratadine (CLARITIN) 10 MG tablet, Take 10 mg by mouth daily., Disp: , Rfl:  .  Multiple Vitamin (MULTI-VITAMINS) TABS, Take by mouth., Disp: , Rfl:  .  naproxen sodium (RA NAPROXEN SODIUM) 220 MG tablet, Take by mouth., Disp: , Rfl:  .  sulfamethoxazole-trimethoprim (BACTRIM DS,SEPTRA DS) 800-160 MG tablet,  Take 1 tablet by mouth., Disp: , Rfl:  .  TURMERIC PO, Take 500 mg by mouth daily., Disp: , Rfl:  .  Zinc 50 MG CAPS, Take 1 capsule by mouth daily., Disp: , Rfl:   Allergies:  Allergies  Allergen Reactions  . Codeine Nausea And Vomiting    Past Medical History, Surgical history, Social history, and Family History were reviewed and updated.  Review of Systems: As above  Physical Exam:  height is _0  (1.626 m). Her oral temperature is 98 F (36.7 C). Her blood pressure is 120/75 and her pulse is 83. Her respiration is 16.   Wt Readings from Last 3 Encounters:  01/22/15 230 lb 13.2 oz (104.7 kg)  12/29/14 231 lb (104.781 kg)  12/12/14 230 lb (104.327 kg)     Head and neck exam shows no ocular or oral lesions. She has no palpable cervical or supraclavicular lymph nodes. Lungs are clear. Cardiac exam regular rate and rhythm with no murmurs, rubs or bruits. Breast exam shows right breast with no masses, edema or erythema. There is no right axillary adenopathy. Left chest wall shows a drainage tube in. She has a healing mastectomy scar. No erythema is noted. No obvious fluid collection is noted. She has no palpable left axillary lymph node. Abdominal exam shows no fluid wave. There is no guarding or rebound tenderness. There is no abdominal mass.  Back exam shows no tenderness over the spine, ribs or hips. Extremities shows no clubbing, cyanosis or edema. No lymphedema is noted in the arms. Skin exam shows no rashes, ecchymoses or petechia. Neurological exam is nonfoca  Lab Results  Component Value Date   WBC 7.0 02/02/2015   HGB 13.6 02/02/2015   HCT 42.8 02/02/2015   MCV 87 02/02/2015   PLT 332 02/02/2015     Chemistry      Component Value Date/Time   NA 135 02/02/2015 1455   NA 141 01/22/2015 1447   NA 144 12/29/2014 0758   K 4.4 02/02/2015 1455   K 4.0 01/22/2015 1447   K 4.9 12/29/2014 0758   CL 104 02/02/2015 1455   CL 106 01/22/2015 1447   CO2 27 02/02/2015 1455    CO2 26 01/22/2015 1447   CO2 27 12/29/2014 0758   BUN 15 02/02/2015 1455   BUN 9 01/22/2015 1447   BUN 20.4 12/29/2014 0758   CREATININE 1.4* 02/02/2015 1455   CREATININE 1.06* 01/22/2015 1447   CREATININE 1.1 12/29/2014 0758      Component Value Date/Time   CALCIUM 10.0 02/02/2015 1455   CALCIUM 9.1 01/22/2015 1447   CALCIUM 10.1 12/29/2014 0758   ALKPHOS 86* 02/02/2015 1455   ALKPHOS 92 12/29/2014 0758   ALKPHOS 73 11/17/2014 0855   AST 37 02/02/2015 1455   AST 26 12/29/2014 0758   AST 31 11/17/2014 0855   ALT 27 02/02/2015 1455   ALT 23 12/29/2014 0758   ALT 22 11/17/2014 0855   BILITOT 0.50 02/02/2015 1455   BILITOT 0.33 12/29/2014 0758   BILITOT 0.3 11/17/2014 0855         Impression and Plan: Ms. Kimberly [name] is a 74 year old postmenopausal female with stage IIb ductal carcinoma of the left breast. She had one positive lymph node. She had a very low Oncotype score so I do not think that there will be a role for chemotherapy.  I just feel bad at she's having the healing troubles from the mastectomy and implant. Hopefully, this will improve. There's not much drainage from the left breast mastectomy site so hopefully the drainage tube will come out soon.  We will go ahead and give her Prolia today. I did this is very reasonable. I did this will clearly help her prognosis.  I think we probably get her back in 6 weeks.  I will let the radiation oncologist and the plastic surgeon decide when she needs the radiation to start.  I spent about 30 minutes with she and her family.   Volanda Napoleon, MD 12/12/20165:38 PM

## 2015-02-03 ENCOUNTER — Encounter: Payer: Self-pay | Admitting: Hematology & Oncology

## 2015-02-03 LAB — VITAMIN D 25 HYDROXY (VIT D DEFICIENCY, FRACTURES): Vit D, 25-Hydroxy: 36 ng/mL (ref 30–100)

## 2015-02-03 LAB — LACTATE DEHYDROGENASE: LDH: 229 U/L (ref 125–245)

## 2015-03-05 ENCOUNTER — Encounter (HOSPITAL_BASED_OUTPATIENT_CLINIC_OR_DEPARTMENT_OTHER): Payer: Self-pay | Admitting: *Deleted

## 2015-03-06 ENCOUNTER — Other Ambulatory Visit: Payer: Self-pay | Admitting: Plastic Surgery

## 2015-03-06 DIAGNOSIS — T814XXD Infection following a procedure, subsequent encounter: Principal | ICD-10-CM

## 2015-03-06 DIAGNOSIS — T792XXD Traumatic secondary and recurrent hemorrhage and seroma, subsequent encounter: Principal | ICD-10-CM

## 2015-03-06 DIAGNOSIS — IMO0001 Reserved for inherently not codable concepts without codable children: Secondary | ICD-10-CM

## 2015-03-09 ENCOUNTER — Ambulatory Visit (HOSPITAL_BASED_OUTPATIENT_CLINIC_OR_DEPARTMENT_OTHER): Payer: Medicare Other | Admitting: Anesthesiology

## 2015-03-09 ENCOUNTER — Encounter (HOSPITAL_BASED_OUTPATIENT_CLINIC_OR_DEPARTMENT_OTHER): Payer: Self-pay

## 2015-03-09 ENCOUNTER — Ambulatory Visit (HOSPITAL_BASED_OUTPATIENT_CLINIC_OR_DEPARTMENT_OTHER)
Admission: RE | Admit: 2015-03-09 | Discharge: 2015-03-09 | Disposition: A | Discharge status: Home / Self Care | Payer: Medicare Other | Source: Ambulatory Visit | Attending: Plastic Surgery | Admitting: Plastic Surgery

## 2015-03-09 ENCOUNTER — Encounter (HOSPITAL_BASED_OUTPATIENT_CLINIC_OR_DEPARTMENT_OTHER)
Admission: RE | Disposition: A | Discharge status: Home / Self Care | Payer: Self-pay | Source: Ambulatory Visit | Attending: Plastic Surgery

## 2015-03-09 DIAGNOSIS — Y838 Other surgical procedures as the cause of abnormal reaction of the patient, or of later complication, without mention of misadventure at the time of the procedure: Secondary | ICD-10-CM | POA: Insufficient documentation

## 2015-03-09 DIAGNOSIS — IMO0001 Reserved for inherently not codable concepts without codable children: Secondary | ICD-10-CM

## 2015-03-09 DIAGNOSIS — T792XXD Traumatic secondary and recurrent hemorrhage and seroma, subsequent encounter: Secondary | ICD-10-CM

## 2015-03-09 DIAGNOSIS — N6489 Other specified disorders of breast: Secondary | ICD-10-CM | POA: Insufficient documentation

## 2015-03-09 DIAGNOSIS — Z96651 Presence of right artificial knee joint: Secondary | ICD-10-CM | POA: Insufficient documentation

## 2015-03-09 DIAGNOSIS — Z7982 Long term (current) use of aspirin: Secondary | ICD-10-CM | POA: Insufficient documentation

## 2015-03-09 DIAGNOSIS — Z9012 Acquired absence of left breast and nipple: Secondary | ICD-10-CM | POA: Diagnosis not present

## 2015-03-09 DIAGNOSIS — T8549XA Other mechanical complication of breast prosthesis and implant, initial encounter: Secondary | ICD-10-CM | POA: Diagnosis not present

## 2015-03-09 DIAGNOSIS — T814XXD Infection following a procedure, subsequent encounter: Secondary | ICD-10-CM

## 2015-03-09 HISTORY — DX: Other specified postprocedural states: R11.2

## 2015-03-09 HISTORY — PX: CAPSULECTOMY: SHX5381

## 2015-03-09 HISTORY — PX: REMOVAL OF TISSUE EXPANDER: SHX6324

## 2015-03-09 HISTORY — DX: Other specified postprocedural states: Z98.890

## 2015-03-09 SURGERY — REMOVAL, TISSUE EXPANDER
Anesthesia: General | Site: Breast | Laterality: Left

## 2015-03-09 MED ORDER — DEXAMETHASONE SODIUM PHOSPHATE 10 MG/ML IJ SOLN
INTRAMUSCULAR | Status: AC
  Filled 2015-03-09: qty 1

## 2015-03-09 MED ORDER — FENTANYL CITRATE (PF) 100 MCG/2ML IJ SOLN
INTRAMUSCULAR | Status: AC
Start: 2015-03-09 — End: 2015-03-09
  Filled 2015-03-09: qty 2

## 2015-03-09 MED ORDER — PROPOFOL 500 MG/50ML IV EMUL
INTRAVENOUS | Status: AC
  Filled 2015-03-09: qty 50

## 2015-03-09 MED ORDER — FENTANYL CITRATE (PF) 100 MCG/2ML IJ SOLN
50.0000 ug | INTRAMUSCULAR | Status: DC | PRN
  Administered 2015-03-09: 25 ug via INTRAVENOUS
  Administered 2015-03-09: 100 ug via INTRAVENOUS

## 2015-03-09 MED ORDER — LACTATED RINGERS IV SOLN
INTRAVENOUS | Status: DC
  Administered 2015-03-09 (×2): via INTRAVENOUS

## 2015-03-09 MED ORDER — ONDANSETRON HCL 4 MG/2ML IJ SOLN: 4.0000 mg | Freq: Once | INTRAMUSCULAR | Status: DC | PRN

## 2015-03-09 MED ORDER — EPHEDRINE SULFATE 50 MG/ML IJ SOLN
INTRAMUSCULAR | Status: DC | PRN
  Administered 2015-03-09 (×2): 10 mg via INTRAVENOUS

## 2015-03-09 MED ORDER — SUCCINYLCHOLINE CHLORIDE 20 MG/ML IJ SOLN
INTRAMUSCULAR | Status: AC
  Filled 2015-03-09: qty 1

## 2015-03-09 MED ORDER — PHENYLEPHRINE 40 MCG/ML (10ML) SYRINGE FOR IV PUSH (FOR BLOOD PRESSURE SUPPORT)
PREFILLED_SYRINGE | INTRAVENOUS | Status: AC
  Filled 2015-03-09: qty 10

## 2015-03-09 MED ORDER — FENTANYL CITRATE (PF) 100 MCG/2ML IJ SOLN
25.0000 ug | INTRAMUSCULAR | Status: DC | PRN
  Administered 2015-03-09: 25 ug via INTRAVENOUS

## 2015-03-09 MED ORDER — CEFAZOLIN SODIUM-DEXTROSE 2-3 GM-% IV SOLR
2.0000 g | INTRAVENOUS | Status: AC
  Administered 2015-03-09: 2 g via INTRAVENOUS

## 2015-03-09 MED ORDER — GLYCOPYRROLATE 0.2 MG/ML IJ SOLN: 0.2000 mg | Freq: Once | INTRAMUSCULAR | Status: DC | PRN

## 2015-03-09 MED ORDER — FENTANYL CITRATE (PF) 100 MCG/2ML IJ SOLN
INTRAMUSCULAR | Status: AC
  Filled 2015-03-09: qty 2

## 2015-03-09 MED ORDER — ONDANSETRON HCL 4 MG/2ML IJ SOLN
INTRAMUSCULAR | Status: DC | PRN
  Administered 2015-03-09: 4 mg via INTRAVENOUS

## 2015-03-09 MED ORDER — BUPIVACAINE-EPINEPHRINE (PF) 0.25% -1:200000 IJ SOLN
INTRAMUSCULAR | Status: AC
  Filled 2015-03-09: qty 30

## 2015-03-09 MED ORDER — DEXAMETHASONE SODIUM PHOSPHATE 4 MG/ML IJ SOLN
INTRAMUSCULAR | Status: DC | PRN
  Administered 2015-03-09: 10 mg via INTRAVENOUS

## 2015-03-09 MED ORDER — SODIUM CHLORIDE 0.9 % IR SOLN
Status: DC | PRN
  Administered 2015-03-09: 500 mL

## 2015-03-09 MED ORDER — BUPIVACAINE-EPINEPHRINE 0.25% -1:200000 IJ SOLN
INTRAMUSCULAR | Status: DC | PRN
  Administered 2015-03-09: 3 mL

## 2015-03-09 MED ORDER — PHENYLEPHRINE HCL 10 MG/ML IJ SOLN
INTRAMUSCULAR | Status: DC | PRN
  Administered 2015-03-09 (×2): 40 ug via INTRAVENOUS

## 2015-03-09 MED ORDER — ATROPINE SULFATE 0.4 MG/ML IJ SOLN
INTRAMUSCULAR | Status: AC
  Filled 2015-03-09: qty 1

## 2015-03-09 MED ORDER — MIDAZOLAM HCL 2 MG/2ML IJ SOLN: 1.0000 mg | INTRAMUSCULAR | Status: DC | PRN

## 2015-03-09 MED ORDER — PHENYLEPHRINE HCL 10 MG/ML IJ SOLN
INTRAMUSCULAR | Status: AC
  Filled 2015-03-09: qty 1

## 2015-03-09 MED ORDER — PHENYLEPHRINE HCL 10 MG/ML IJ SOLN
10.0000 mg | INTRAVENOUS | Status: DC | PRN
  Administered 2015-03-09: 40 ug/min via INTRAVENOUS

## 2015-03-09 MED ORDER — PROPOFOL 10 MG/ML IV BOLUS
INTRAVENOUS | Status: DC | PRN
  Administered 2015-03-09: 150 mg via INTRAVENOUS

## 2015-03-09 MED ORDER — SCOPOLAMINE 1 MG/3DAYS TD PT72
1.0000 | MEDICATED_PATCH | Freq: Once | TRANSDERMAL | Status: DC | PRN
  Administered 2015-03-09: 1.5 mg via TRANSDERMAL

## 2015-03-09 MED ORDER — ONDANSETRON HCL 4 MG/2ML IJ SOLN
INTRAMUSCULAR | Status: AC
  Filled 2015-03-09: qty 2

## 2015-03-09 MED ORDER — LIDOCAINE HCL (CARDIAC) 20 MG/ML IV SOLN
INTRAVENOUS | Status: DC | PRN
  Administered 2015-03-09: 50 mg via INTRAVENOUS

## 2015-03-09 MED ORDER — LIDOCAINE HCL (CARDIAC) 20 MG/ML IV SOLN
INTRAVENOUS | Status: AC
  Filled 2015-03-09: qty 5

## 2015-03-09 MED ORDER — SCOPOLAMINE 1 MG/3DAYS TD PT72
MEDICATED_PATCH | TRANSDERMAL | Status: AC
Start: 2015-03-09 — End: 2015-03-09
  Filled 2015-03-09: qty 1

## 2015-03-09 MED ORDER — CEFAZOLIN SODIUM-DEXTROSE 2-3 GM-% IV SOLR
INTRAVENOUS | Status: AC
  Filled 2015-03-09: qty 50

## 2015-03-09 MED ORDER — 0.9 % SODIUM CHLORIDE (POUR BTL) OPTIME
TOPICAL | Status: DC | PRN
  Administered 2015-03-09: 500 mL

## 2015-03-09 MED ORDER — EPHEDRINE SULFATE 50 MG/ML IJ SOLN
INTRAMUSCULAR | Status: AC
  Filled 2015-03-09: qty 1

## 2015-03-09 SURGICAL SUPPLY — 67 items
ADH SKN CLS APL DERMABOND .7 (GAUZE/BANDAGES/DRESSINGS) ×1
BAG DECANTER FOR FLEXI CONT (MISCELLANEOUS) ×3 IMPLANT
BINDER BREAST LRG (GAUZE/BANDAGES/DRESSINGS) IMPLANT
BINDER BREAST MEDIUM (GAUZE/BANDAGES/DRESSINGS) IMPLANT
BINDER BREAST XLRG (GAUZE/BANDAGES/DRESSINGS) ×2 IMPLANT
BINDER BREAST XXLRG (GAUZE/BANDAGES/DRESSINGS) IMPLANT
BIOPATCH RED 1 DISK 7.0 (GAUZE/BANDAGES/DRESSINGS) ×1 IMPLANT
BIOPATCH RED 1IN DISK 7.0MM (GAUZE/BANDAGES/DRESSINGS) ×1
BLADE SURG 15 STRL LF DISP TIS (BLADE) ×1 IMPLANT
BLADE SURG 15 STRL SS (BLADE) ×3
BNDG GAUZE ELAST 4 BULKY (GAUZE/BANDAGES/DRESSINGS) ×6 IMPLANT
CANISTER SUCT 1200ML W/VALVE (MISCELLANEOUS) ×3 IMPLANT
CHLORAPREP W/TINT 26ML (MISCELLANEOUS) ×3 IMPLANT
COVER BACK TABLE 60X90IN (DRAPES) ×3 IMPLANT
COVER MAYO STAND STRL (DRAPES) ×3 IMPLANT
DECANTER SPIKE VIAL GLASS SM (MISCELLANEOUS) IMPLANT
DERMABOND ADVANCED (GAUZE/BANDAGES/DRESSINGS) ×2
DERMABOND ADVANCED .7 DNX12 (GAUZE/BANDAGES/DRESSINGS) IMPLANT
DRAIN CHANNEL 19F RND (DRAIN) ×2 IMPLANT
DRAPE LAPAROSCOPIC ABDOMINAL (DRAPES) ×3 IMPLANT
DRSG PAD ABDOMINAL 8X10 ST (GAUZE/BANDAGES/DRESSINGS) ×6 IMPLANT
ELECT BLADE 4.0 EZ CLEAN MEGAD (MISCELLANEOUS) ×3
ELECT COATED BLADE 2.86 ST (ELECTRODE) ×3 IMPLANT
ELECT REM PT RETURN 9FT ADLT (ELECTROSURGICAL) ×3
ELECTRODE BLDE 4.0 EZ CLN MEGD (MISCELLANEOUS) ×1 IMPLANT
ELECTRODE REM PT RTRN 9FT ADLT (ELECTROSURGICAL) ×1 IMPLANT
EVACUATOR SILICONE 100CC (DRAIN) ×2 IMPLANT
GLOVE BIO SURGEON STRL SZ 6.5 (GLOVE) ×4 IMPLANT
GLOVE BIO SURGEONS STRL SZ 6.5 (GLOVE) ×2
GLOVE BIOGEL PI IND STRL 6.5 (GLOVE) IMPLANT
GLOVE BIOGEL PI INDICATOR 6.5 (GLOVE) ×4
GLOVE ECLIPSE 6.5 STRL STRAW (GLOVE) ×2 IMPLANT
GOWN STRL REUS W/ TWL LRG LVL3 (GOWN DISPOSABLE) ×2 IMPLANT
GOWN STRL REUS W/TWL LRG LVL3 (GOWN DISPOSABLE) ×6
IV NS 1000ML (IV SOLUTION)
IV NS 1000ML BAXH (IV SOLUTION) IMPLANT
LIQUID BAND (GAUZE/BANDAGES/DRESSINGS) ×3 IMPLANT
NDL HYPO 25X1 1.5 SAFETY (NEEDLE) ×1 IMPLANT
NDL SAFETY ECLIPSE 18X1.5 (NEEDLE) ×1 IMPLANT
NEEDLE HYPO 18GX1.5 SHARP (NEEDLE) ×3
NEEDLE HYPO 25X1 1.5 SAFETY (NEEDLE) ×3 IMPLANT
NS IRRIG 1000ML POUR BTL (IV SOLUTION) ×2 IMPLANT
PACK BASIN DAY SURGERY FS (CUSTOM PROCEDURE TRAY) ×3 IMPLANT
PENCIL BUTTON HOLSTER BLD 10FT (ELECTRODE) ×3 IMPLANT
PIN SAFETY STERILE (MISCELLANEOUS) ×2 IMPLANT
SLEEVE SCD COMPRESS KNEE MED (MISCELLANEOUS) ×3 IMPLANT
SPONGE GAUZE 4X4 12PLY STER LF (GAUZE/BANDAGES/DRESSINGS) ×4 IMPLANT
SPONGE LAP 18X18 X RAY DECT (DISPOSABLE) ×6 IMPLANT
SUT MNCRL AB 3-0 PS2 18 (SUTURE) IMPLANT
SUT MNCRL AB 4-0 PS2 18 (SUTURE) ×3 IMPLANT
SUT MON AB 3-0 SH 27 (SUTURE) ×3
SUT MON AB 3-0 SH27 (SUTURE) ×1 IMPLANT
SUT MON AB 5-0 PS2 18 (SUTURE) IMPLANT
SUT PDS 3-0 CT2 (SUTURE)
SUT PDS AB 2-0 CT2 27 (SUTURE) IMPLANT
SUT PDS II 3-0 CT2 27 ABS (SUTURE) IMPLANT
SUT SILK 3 0 PS 1 (SUTURE) IMPLANT
SUT VIC AB 3-0 SH 27 (SUTURE) ×3
SUT VIC AB 3-0 SH 27X BRD (SUTURE) ×1 IMPLANT
SUT VICRYL 4-0 PS2 18IN ABS (SUTURE) ×3 IMPLANT
SYR BULB IRRIGATION 50ML (SYRINGE) ×3 IMPLANT
SYR CONTROL 10ML LL (SYRINGE) ×2 IMPLANT
TOWEL OR 17X24 6PK STRL BLUE (TOWEL DISPOSABLE) ×6 IMPLANT
TUBE CONNECTING 20'X1/4 (TUBING) ×1
TUBE CONNECTING 20X1/4 (TUBING) ×2 IMPLANT
UNDERPAD 30X30 (UNDERPADS AND DIAPERS) ×6 IMPLANT
YANKAUER SUCT BULB TIP NO VENT (SUCTIONS) ×3 IMPLANT

## 2015-03-09 NOTE — H&P (Signed)
Kimberly [name] is an 75 y.o. female.   Chief Complaint: seroma HPI: The patient is a 75 yrs old wf here with her family for treatment of a left breast seroma.  She underwent a left mastectomy with SLD.  She had immediate breast reconstruction with an expander and FlexHD placed.  She had on going seroma and was taken back to the OR for revision.  She had another seroma formation and the expander and Flex HD was exchanged.  She has continued to have seroma formation and has decided to have the expander removed.  Past Medical History  Diagnosis Date  . Arthritis   . Seizures (HCC)     hx of one seizure in 1963--no known reason for the seizure and none since  . DDD (degenerative disc disease), lumbar   . Breast cancer ([place]) 09/10/14    Right Breast  . Seroma     left breast  . Kidney stones   . Complication of anesthesia     has issues with inner ear, usually gets patch behind ear and use nausea medicine to prevent inner ear dizziness  . PONV (postoperative nausea and vomiting)     Past Surgical History  Procedure Laterality Date  . Cholecystectomy    . Lithotripsy    . Tubal ligation    . Breast surgery  1997    RIGHT BREAST BIOPSY  . Total knee arthroplasty Right 05/14/2012    Procedure: TOTAL KNEE ARTHROPLASTY;  Surgeon: Gearlean Alf, MD;  Location: [facility];  Service: Orthopedics;  Laterality: Right;  . Mastectomy w/ sentinel node biopsy Left 11/20/2014    Procedure: MASTECTOMY WITH SENTINEL LYMPH NODE BIOPSY;  Surgeon: Alphonsa Overall, MD;  Location: [place];  Service: General;  Laterality: Left;  . Breast reconstruction with placement of tissue expander and flex hd (acellular hydrated dermis) Left 11/20/2014    Procedure: LEFT BREAST RECONSTRUCTION WITH PLACEMENT OF TISSUE EXPANDER AND FLEX HD (ACELLULAR HYDRATED DERMIS) ;  Surgeon: [name], DO;  Location: [place];  Service: Plastics;  Laterality: Left;  . Incision and drainage of wound  Left 12/12/2014    Procedure: LEFT BREAST DRAINAGE OF SEROMA  AND EXCISION OF EXCESS SKIN;  Surgeon: [name], DO;  Location: [place];  Service: Plastics;  Laterality: Left;  . Colonoscopy    . Incision and drainage of wound Left 01/22/2015    Procedure: IRRIGATION AND DEBRIDEMENT OF LEFT BREAST POCKET WITH REMOVAL TISSUE EXPANDER AND REPLACEMENT EXPANDER, POSSIBLE ACELLULAR DERMAL MATRIX PLACEMENT;  Surgeon: Loel Lofty Dillingham, DO;  Location: [facility];  Service: Plastics;  Laterality: Left;    Family History  Problem Relation Age of Onset  . Breast cancer Maternal Aunt   . Cancer Son     ewing's sarcoma  . Lung cancer Maternal Aunt   . Lung cancer Father   . Arthritis Mother   . Pneumonia Mother   . Congestive Heart Failure Mother   . Transient ischemic attack Mother    Social History:  reports that she has never smoked. She has never used smokeless tobacco. She reports that she does not drink alcohol or use illicit drugs.  Allergies:  Allergies  Allergen Reactions  . Codeine Nausea And Vomiting    Medications Prior to Admission  Medication Sig Dispense Refill  . acetaminophen (TYLENOL) 650 MG CR tablet Take 1,300 mg by mouth. Takes 2 every am and 2 every pm    . Ascorbic Acid (  VITAMIN C) 1000 MG tablet Take by mouth.    Marland Kitchen aspirin EC 81 MG tablet Take by mouth.    . Calcium Carbonate (CALCIUM 600 PO) Take 1 tablet by mouth daily.    . Cholecalciferol (D 2000) 2000 UNITS TABS Take by mouth.    . Glucosamine-Chondroit-Vit C-Mn (GLUCOSAMINE 1500 COMPLEX PO) Take 1 tablet by mouth daily.    Marland Kitchen letrozole (FEMARA) 2.5 MG tablet Take 1 tablet (2.5 mg total) by mouth daily. 30 tablet 12  . loratadine (CLARITIN) 10 MG tablet Take 10 mg by mouth daily.    . Multiple Vitamin (MULTI-VITAMINS) TABS Take by mouth.    . naproxen sodium (RA NAPROXEN SODIUM) 220 MG tablet Take by mouth.    . TURMERIC PO Take 500 mg by mouth daily.    . Zinc 50 MG CAPS Take 1 capsule  by mouth daily.      No results found for this or any previous visit (from the past 48 hour(s)). No results found.  Review of Systems  Constitutional: Negative.   HENT: Negative.   Eyes: Negative.   Respiratory: Negative.   Cardiovascular: Negative.   Gastrointestinal: Negative.   Genitourinary: Negative.   Musculoskeletal: Negative.   Skin: Negative.   Psychiatric/Behavioral: Negative.     Blood pressure 117/74, pulse 87, temperature 98.1 F (36.7 C), temperature source Oral, resp. rate 20, height 5\' 4"  (1.626 m), weight 106.595 kg (235 lb), SpO2 100 %. Physical Exam  Constitutional: She is oriented to person, place, and time. She appears well-developed and well-nourished.  HENT:  Head: Normocephalic and atraumatic.  Eyes: Conjunctivae and EOM are normal. Pupils are equal, round, and reactive to light.  Cardiovascular: Normal rate.   Respiratory: Effort normal.  GI: Soft.  Neurological: She is alert and oriented to person, place, and time.  Skin: Skin is warm.  Psychiatric: She has a normal mood and affect. Her behavior is normal. Judgment and thought content normal.     Assessment/Plan Plan for removal of expander and FlexHD.  Risks and complications were reviewed extensively in the office.  She is not planning on any further reconstruction per our conversation today.  She will have radiation in the near future.  [name] 03/09/2015, 7:20 AM

## 2015-03-09 NOTE — Brief Op Note (Signed)
03/09/2015  9:22 AM  PATIENT:  Kimberly [name]  74 y.o. female  PRE-OPERATIVE DIAGNOSIS:  SEROMA OF BREAST ACQUIRED ABSENT OF LEFT BREAST AND NIPPLE MALIGNANT NEOPLASM OF UPPER OUTTER QUADRANT OF LEFT BREAST   POST-OPERATIVE DIAGNOSIS:  SEROMA OF BREAST ACQUIRED ABSENT OF LEFT BREAST AND NIPPLE MALIGNANT NEOPLASM OF UPPER OUTTER QUADRANT OF LEFT BREAST   PROCEDURE:  Procedure(s): REMOVAL OF LEFT BREAST TISSUE EXPANDER AND PLACEMENT OF DRAIN  (Left)  SURGEON:  Surgeon(s) and Role:    * Loel Lofty Makylah Bossard, DO - Primary  PHYSICIAN ASSISTANT: Dr. Posey Pronto  ASSISTANTS: none   ANESTHESIA:   general  EBL:  Total I/O In: 1400 [I.V.:1400] Out: -   BLOOD ADMINISTERED:none  DRAINS: (1) Jackson-Pratt drain(s) with closed bulb suction in the left breast pocket   LOCAL MEDICATIONS USED:  MARCAINE     SPECIMEN:  Source of Specimen:  left mastectomy scar and capsule  DISPOSITION OF SPECIMEN:  PATHOLOGY  COUNTS:  YES  TOURNIQUET:  * No tourniquets in log *  DICTATION: .Dragon Dictation  PLAN OF CARE: Discharge to home after PACU  PATIENT DISPOSITION:  PACU - hemodynamically stable.   Delay start of Pharmacological VTE agent (>24hrs) due to surgical blood loss or risk of bleeding: no

## 2015-03-09 NOTE — Discharge Instructions (Addendum)
Continue binder Drain care May shower tomorrow    About my Jackson-Pratt Bulb Drain  What is a Jackson-Pratt bulb? A Jackson-Pratt is a soft, round device used to collect drainage. It is connected to a long, thin drainage catheter, which is held in place by one or two small stiches near your surgical incision site. When the bulb is squeezed, it forms a vacuum, forcing the drainage to empty into the bulb.  Emptying the Jackson-Pratt bulb- To empty the bulb: 1. Release the plug on the top of the bulb. 2. Pour the bulb's contents into a measuring container which your nurse will provide. 3. Record the time emptied and amount of drainage. Empty the drain(s) as often as your     doctor or nurse recommends.  Date                  Time                    Amount (Drain 1)                 Amount (Drain 2)  _____________________________________________________________________  _____________________________________________________________________  _____________________________________________________________________  _____________________________________________________________________  _____________________________________________________________________  _____________________________________________________________________  _____________________________________________________________________  _____________________________________________________________________  Squeezing the Jackson-Pratt Bulb- To squeeze the bulb: 1. Make sure the plug at the top of the bulb is open. 2. Squeeze the bulb tightly in your fist. You will hear air squeezing from the bulb. 3. Replace the plug while the bulb is squeezed. 4. Use a safety pin to attach the bulb to your clothing. This will keep the catheter from     pulling at the bulb insertion site.  When to call your doctor- Call your doctor if:  Drain site becomes red, swollen or hot.  You have a fever greater than 101 degrees F.  There is oozing  at the drain site.  Drain falls out (apply a guaze bandage over the drain hole and secure it with tape).  Drainage increases daily not related to activity patterns. (You will usually have more drainage when you are active than when you are resting.)  Drainage has a bad odor.      Post Anesthesia Home Care Instructions  Activity: Get plenty of rest for the remainder of the day. A responsible adult should stay with you for 24 hours following the procedure.  For the next 24 hours, DO NOT: -Drive a car -Paediatric nurse -Drink alcoholic beverages -Take any medication unless instructed by your physician -Make any legal decisions or sign important papers.  Meals: Start with liquid foods such as gelatin or soup. Progress to regular foods as tolerated. Avoid greasy, spicy, heavy foods. If nausea and/or vomiting occur, drink only clear liquids until the nausea and/or vomiting subsides. Call your physician if vomiting continues.  Special Instructions/Symptoms: Your throat may feel dry or sore from the anesthesia or the breathing tube placed in your throat during surgery. If this causes discomfort, gargle with warm salt water. The discomfort should disappear within 24 hours.  If you had a scopolamine patch placed behind your ear for the management of post- operative nausea and/or vomiting:  1. The medication in the patch is effective for 72 hours, after which it should be removed.  Wrap patch in a tissue and discard in the trash. Wash hands thoroughly with soap and water. 2. You may remove the patch earlier than 72 hours if you experience unpleasant side effects which may include dry mouth, dizziness or visual disturbances. 3. Avoid touching  the patch. Wash your hands with soap and water after contact with the patch.

## 2015-03-09 NOTE — Anesthesia Postprocedure Evaluation (Signed)
Anesthesia Post Note  Patient: Kimberly [name]  Procedure(s) Performed: Procedure(s) (LRB): REMOVAL OF LEFT BREAST TISSUE EXPANDER AND PLACEMENT OF DRAIN  (Left)  Patient location during evaluation: PACU Anesthesia Type: General Level of consciousness: awake and alert and patient cooperative Pain management: pain level controlled Vital Signs Assessment: post-procedure vital signs reviewed and stable Respiratory status: spontaneous breathing and respiratory function stable Cardiovascular status: stable Anesthetic complications: no    Last Vitals:  Filed Vitals:   03/09/15 0930 03/09/15 0945  BP: 110/74 122/75  Pulse: 90 89  Temp:    Resp: 10 12    Last Pain:  Filed Vitals:   03/09/15 1003  PainSc: [place]

## 2015-03-09 NOTE — Transfer of Care (Signed)
Immediate Anesthesia Transfer of Care Note  Patient: Kimberly [name]  Procedure(s) Performed: Procedure(s): REMOVAL OF LEFT BREAST TISSUE EXPANDER AND PLACEMENT OF DRAIN  (Left)  Patient Location: PACU  Anesthesia Type:General  Level of Consciousness: awake, alert  and oriented  Airway & Oxygen Therapy: Patient Spontanous Breathing and Patient connected to face mask oxygen  Post-op Assessment: Report given to RN and Post -op Vital signs reviewed and stable  Post vital signs: Reviewed and stable  Last Vitals:  Filed Vitals:   03/09/15 0649  BP: 117/74  Pulse: 87  Temp: 36.7 C  Resp: 20    Complications: No apparent anesthesia complications

## 2015-03-09 NOTE — Op Note (Signed)
Op report Unilateral Breast Exchange   DATE OF OPERATION:  03/09/2015  LOCATION: [place]  SURGICAL DIVISION: Plastic Surgery  PREOPERATIVE DIAGNOSES:  1. History of left breast cancer.  2. Acquired absence of left breast.  3. Left Breast seroma.  POSTOPERATIVE DIAGNOSES:  1. History of left breast cancer.  2. Acquired absence of left breast.  3. Left Breast seroma.  PROCEDURE:  1. Removal of left breast expander and FlexHD. 2. Left breast complete excision of breast capsule - capsulectomy.  SURGEON: Chelesa Weingartner Sanger Kathlee Barnhardt, DO  ASSISTANT: Dr. Posey Pronto  ANESTHESIA:  General.   COMPLICATIONS: None.   INDICATIONS FOR PROCEDURE:  The patient, Kimberly [name], is a 75 y.o. female born on 09/27/1940, is here for treatment for further treatment after a mastectomy and placement of a tissue expander. She now presents for removal of her expander and FlexHD due to a persistent seroma. MRN: [id number]  CONSENT:  Informed consent was obtained directly from the patient. Risks, benefits and alternatives were fully discussed. Specific risks including but not limited to bleeding, infection, hematoma, seroma, scarring, pain, implant infection, implant extrusion, capsular contracture, asymmetry, wound healing problems, and need for further surgery were all discussed. The patient did have an ample opportunity to have her questions answered to her satisfaction.   DESCRIPTION OF PROCEDURE:  The patient was taken to the operating room. SCDs were placed and IV antibiotics were given. The patient's chest was prepped and draped in a sterile fashion. A time out was performed.  One percent Xylocaine with epinephrine was used to infiltrate the area.   The old mastectomy scar was excised.  The superior mastectomy and inferior mastectomy flaps had a capsule as well as the pectoralis muscle.  The FlexHD was not well adherent to the adjacent structures.  The bovie and scissors were used  to excise the capsule from the superficial muscle side and both flaps.  The lateral chest wall also had a capsule and this was excised as well.  All of the Flex was excised. There was no indication of infection. Hemostasis was ensured with electrocautery.  The pocket was irrigated with antibiotic solution.  New gloves were placed.  The old drain was removed and a new drain placed through a new site.  It was secured with a 3-0 Silk to the chest skin. The soft tissue was closed with 3-0 Monocryl followed by 4-0 deep dermal and 5-0 Monocryl subcuticular stitches.  Dermabond was applied.  A breast binder and ABD was applied.  The patient was allowed to wake from anesthesia and taken to the recovery room in satisfactory condition.

## 2015-03-09 NOTE — Anesthesia Procedure Notes (Signed)
Procedure Name: LMA Insertion Date/Time: 03/09/2015 7:39 AM Performed by: Melynda Ripple D Pre-anesthesia Checklist: Patient identified, Emergency Drugs available, Suction available and Patient being monitored Patient Re-evaluated:Patient Re-evaluated prior to inductionOxygen Delivery Method: Circle System Utilized Preoxygenation: Pre-oxygenation with 100% oxygen Intubation Type: IV induction Ventilation: Mask ventilation without difficulty LMA: LMA inserted LMA Size: 4.0 Number of attempts: 1 Airway Equipment and Method: Bite block Placement Confirmation: positive ETCO2 Tube secured with: Tape Dental Injury: Teeth and Oropharynx as per pre-operative assessment

## 2015-03-09 NOTE — Anesthesia Preprocedure Evaluation (Signed)
Anesthesia Evaluation  Patient identified by MRN, date of birth, ID band Patient awake  General Assessment Comment:   Reviewed: Allergy & Precautions, NPO status , Patient's Chart, lab work & pertinent test results  History of Anesthesia Complications (+) PONV and history of anesthetic complications  Airway Mallampati: II  TM Distance: >3 FB Neck ROM: Full    Dental no notable dental hx. (+) Teeth Intact, Dental Advisory Given   Pulmonary neg pulmonary ROS,    Pulmonary exam normal breath sounds clear to auscultation       Cardiovascular negative cardio ROS Normal cardiovascular exam Rhythm:Regular Rate:Normal     Neuro/Psych Seizures -, Well Controlled,  hx of one seizure in 1963--no known reason for the seizure and none since negative psych ROS   GI/Hepatic negative GI ROS, Neg liver ROS,   Endo/Other  Morbid obesity  Renal/GU Renal diseaseKidney stones  negative genitourinary   Musculoskeletal negative musculoskeletal ROS (+) Arthritis ,   Abdominal   Peds negative pediatric ROS (+)  Hematology negative hematology ROS (+)   Anesthesia Other Findings   Reproductive/Obstetrics negative OB ROS                             BP Readings from Last 3 Encounters:  02/02/15 120/75  01/23/15 91/50  12/29/14 118/73   Lab Results  Component Value Date   WBC 7.0 02/02/2015   HGB 13.6 02/02/2015   HCT 42.8 02/02/2015   MCV 87 02/02/2015   PLT 332 02/02/2015     Chemistry      Component Value Date/Time   NA 135 02/02/2015 1455   NA 141 01/22/2015 1447   NA 144 12/29/2014 0758   K 4.4 02/02/2015 1455   K 4.0 01/22/2015 1447   K 4.9 12/29/2014 0758   CL 104 02/02/2015 1455   CL 106 01/22/2015 1447   CO2 27 02/02/2015 1455   CO2 26 01/22/2015 1447   CO2 27 12/29/2014 0758   BUN 15 02/02/2015 1455   BUN 9 01/22/2015 1447   BUN 20.4 12/29/2014 0758   CREATININE 1.4* 02/02/2015 1455    CREATININE 1.06* 01/22/2015 1447   CREATININE 1.1 12/29/2014 0758      Component Value Date/Time   CALCIUM 10.0 02/02/2015 1455   CALCIUM 9.1 01/22/2015 1447   CALCIUM 10.1 12/29/2014 0758   ALKPHOS 86* 02/02/2015 1455   ALKPHOS 92 12/29/2014 0758   ALKPHOS 73 11/17/2014 0855   AST 37 02/02/2015 1455   AST 26 12/29/2014 0758   AST 31 11/17/2014 0855   ALT 27 02/02/2015 1455   ALT 23 12/29/2014 0758   ALT 22 11/17/2014 0855   BILITOT 0.50 02/02/2015 1455   BILITOT 0.33 12/29/2014 0758   BILITOT 0.3 11/17/2014 0855      Anesthesia Physical  Anesthesia Plan  ASA: III  Anesthesia Plan: General   Post-op Pain Management:    Induction: Intravenous  Airway Management Planned: LMA  Additional Equipment:   Intra-op Plan:   Post-operative Plan: Extubation in OR  Informed Consent: I have reviewed the patients History and Physical, chart, labs and discussed the procedure including the risks, benefits and alternatives for the proposed anesthesia with the patient or authorized representative who has indicated his/her understanding and acceptance.   Dental advisory given  Plan Discussed with: CRNA and Surgeon  Anesthesia Plan Comments:         Anesthesia Quick Evaluation

## 2015-03-10 ENCOUNTER — Encounter (HOSPITAL_BASED_OUTPATIENT_CLINIC_OR_DEPARTMENT_OTHER): Payer: Self-pay | Admitting: Plastic Surgery

## 2015-03-17 DIAGNOSIS — Z45819 Encounter for adjustment or removal of unspecified breast implant: Secondary | ICD-10-CM | POA: Insufficient documentation

## 2015-03-19 ENCOUNTER — Encounter: Payer: Self-pay | Admitting: Hematology & Oncology

## 2015-03-19 ENCOUNTER — Ambulatory Visit: Payer: Medicare Other

## 2015-03-19 ENCOUNTER — Other Ambulatory Visit (HOSPITAL_BASED_OUTPATIENT_CLINIC_OR_DEPARTMENT_OTHER): Payer: Medicare Other

## 2015-03-19 ENCOUNTER — Ambulatory Visit (HOSPITAL_BASED_OUTPATIENT_CLINIC_OR_DEPARTMENT_OTHER): Payer: Medicare Other | Admitting: Hematology & Oncology

## 2015-03-19 VITALS — BP 104/77 | HR 93 | Temp 98.0°F | Resp 18 | Ht 64.0 in | Wt 231.0 lb

## 2015-03-19 DIAGNOSIS — C779 Secondary and unspecified malignant neoplasm of lymph node, unspecified: Secondary | ICD-10-CM

## 2015-03-19 DIAGNOSIS — C50912 Malignant neoplasm of unspecified site of left female breast: Secondary | ICD-10-CM

## 2015-03-19 DIAGNOSIS — C50012 Malignant neoplasm of nipple and areola, left female breast: Secondary | ICD-10-CM

## 2015-03-19 DIAGNOSIS — M818 Other osteoporosis without current pathological fracture: Secondary | ICD-10-CM

## 2015-03-19 DIAGNOSIS — T386X5A Adverse effect of antigonadotrophins, antiestrogens, antiandrogens, not elsewhere classified, initial encounter: Secondary | ICD-10-CM

## 2015-03-19 LAB — CBC WITH DIFFERENTIAL (CANCER CENTER ONLY)
BASO#: 0.1 10*3/uL (ref 0.0–0.2)
BASO%: 1.5 % (ref 0.0–2.0)
EOS ABS: 0.9 10*3/uL — AB (ref 0.0–0.5)
EOS%: 10.2 % — AB (ref 0.0–7.0)
HCT: 42.7 % (ref 34.8–46.6)
HEMOGLOBIN: 13.6 g/dL (ref 11.6–15.9)
LYMPH#: 2.1 10*3/uL (ref 0.9–3.3)
LYMPH%: 23.7 % (ref 14.0–48.0)
MCH: 27.7 pg (ref 26.0–34.0)
MCHC: 31.9 g/dL — ABNORMAL LOW (ref 32.0–36.0)
MCV: 87 fL (ref 81–101)
MONO#: 1.1 10*3/uL — ABNORMAL HIGH (ref 0.1–0.9)
MONO%: 12.5 % (ref 0.0–13.0)
NEUT%: 52.1 % (ref 39.6–80.0)
NEUTROS ABS: 4.7 10*3/uL (ref 1.5–6.5)
PLATELETS: 284 10*3/uL (ref 145–400)
RBC: 4.91 10*6/uL (ref 3.70–5.32)
RDW: 15 % (ref 11.1–15.7)
WBC: 9 10*3/uL (ref 3.9–10.0)

## 2015-03-19 LAB — COMPREHENSIVE METABOLIC PANEL (CC13)
ALBUMIN: 3.8 g/dL (ref 3.5–4.8)
ALT: 16 IU/L (ref 0–32)
AST (SGOT): 23 IU/L (ref 0–40)
Albumin/Globulin Ratio: 1.5 (ref 1.1–2.5)
Alkaline Phosphatase, S: 84 IU/L (ref 39–117)
BILIRUBIN TOTAL: 0.2 mg/dL (ref 0.0–1.2)
BUN / CREAT RATIO: 24 (ref 11–26)
BUN: 19 mg/dL (ref 8–27)
CALCIUM: 9.6 mg/dL (ref 8.7–10.3)
CHLORIDE: 108 mmol/L — AB (ref 96–106)
CO2: 26 mmol/L (ref 18–29)
CREATININE: 0.8 mg/dL (ref 0.57–1.00)
GFR, EST AFRICAN AMERICAN: 85 mL/min/{1.73_m2} (ref >59)
GFR, EST NON AFRICAN AMERICAN: 74 mL/min/{1.73_m2} (ref >59)
GLUCOSE: 90 mg/dL (ref 65–99)
Globulin, Total: 2.6 g/dL (ref 1.5–4.5)
Potassium, Ser: 4.3 mmol/L (ref 3.5–5.2)
Sodium: 140 mmol/L (ref 134–144)
TOTAL PROTEIN: 6.4 g/dL (ref 6.0–8.5)

## 2015-03-19 NOTE — Progress Notes (Signed)
Hematology and Oncology Follow Up Visit  Kimberly [name] [id number] 1940/04/14 75 y.o. 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  Oncotype score is 7  Current Therapy:    Femara 2.5 mg by mouth daily  Prolia 60 mg subcutaneous every 6 months-first dose today     Interim History:  Kimberly [name] is back for follow-up. She finally had to have surgery to remove the implant/expander. She just was not healing up. She still has the drain in.  This is been very wary for her. She is just frustrated that she cannot have radiation right now. I'm not sure when this could even be done.  She has included only bad knees. She barely walk.  Unfortunately, she is in no shape to take any kind of surgery for her knees right now.   She has had some temperatures. She showed me pictures of the chest wall on the left side. She had quite a bit of erythema. She currently is on ciprofloxacin. She has some cloudy type fluid in her drainage bulb. I collected that and we will send it off a culture. I don't think any change in antibiotics is necessary right now.  She had she looks quite good. Thankfully, she has had no nausea or vomiting. She's had no change in bowel or bladder habits.  She asked me when I thought that she might have radiation. I told her that I just do not know that answer. I think the radiation oncologist would have to see her again.  Overall, her performance status is ECOG 1.  Medications:  Current outpatient prescriptions:  .  acetaminophen (TYLENOL) 650 MG CR tablet, Take 1,300 mg by mouth. Takes 2 every am and 2 every pm, Disp: , Rfl:  .  Ascorbic Acid (VITAMIN C) 1000 MG tablet, Take by mouth., Disp: , Rfl:  .  aspirin EC 81 MG tablet, Take by mouth., Disp: , Rfl:  .  Calcium Carbonate (CALCIUM 600 PO), Take 1 tablet by mouth daily., Disp: , Rfl:  .  Cholecalciferol (D 2000) 2000 UNITS TABS, Take by mouth., Disp: , Rfl:  .   ciprofloxacin (CIPRO) 500 MG tablet, , Disp: , Rfl:  .  Glucosamine-Chondroit-Vit C-Mn (GLUCOSAMINE 1500 COMPLEX PO), Take 1 tablet by mouth daily., Disp: , Rfl:  .  letrozole (FEMARA) 2.5 MG tablet, Take 1 tablet (2.5 mg total) by mouth daily., Disp: 30 tablet, Rfl: 12 .  loratadine (CLARITIN) 10 MG tablet, Take 10 mg by mouth daily., Disp: , Rfl:  .  Multiple Vitamin (MULTI-VITAMINS) TABS, Take by mouth., Disp: , Rfl:  .  naproxen sodium (RA NAPROXEN SODIUM) 220 MG tablet, Take by mouth., Disp: , Rfl:  .  TURMERIC PO, Take 500 mg by mouth daily., Disp: , Rfl:  .  Zinc 50 MG CAPS, Take 1 capsule by mouth daily., Disp: , Rfl:   Allergies:  Allergies  Allergen Reactions  . Codeine Nausea And Vomiting    Past Medical History, Surgical history, Social history, and Family History were reviewed and updated.  Review of Systems: As above  Physical Exam:  height is '5\' 4"'$  (1.626 m) and weight is 231 lb (104.781 kg). Her oral temperature is 98 F (36.7 C). Her blood pressure is 104/77 and her pulse is 93. Her respiration is 18.   Wt Readings from Last 3 Encounters:  03/19/15 231 lb (104.781 kg)  03/09/15 235 lb (106.595 kg)  01/22/15 230 lb  13.2 oz (104.7 kg)     Head and neck exam shows no ocular or oral lesions. She has no palpable cervical or supraclavicular lymph nodes. Lungs are clear. Cardiac exam regular rate and rhythm with no murmurs, rubs or bruits. Breast exam shows right breast with no masses, edema or erythema. There is no right axillary adenopathy. Left chest wall shows a drainage tube in. She has a healing mastectomy scar. No erythema is noted. No obvious fluid collection is noted. She has no palpable left axillary lymph node. Abdominal exam shows no fluid wave. There is no guarding or rebound tenderness. There is no abdominal mass. Back exam shows no tenderness over the spine, ribs or hips. Extremities shows no clubbing, cyanosis or edema. No lymphedema is noted in the arms. Skin  exam shows no rashes, ecchymoses or petechia. Neurological exam is nonfoca  Lab Results  Component Value Date   WBC 9.0 03/19/2015   HGB 13.6 03/19/2015   HCT 42.7 03/19/2015   MCV 87 03/19/2015   PLT 284 03/19/2015     Chemistry      Component Value Date/Time   NA 140 03/19/2015 1456   NA 135 02/02/2015 1455   NA 141 01/22/2015 1447   NA 144 12/29/2014 0758   K 4.3 03/19/2015 1456   K 4.4 02/02/2015 1455   K 4.0 01/22/2015 1447   K 4.9 12/29/2014 0758   CL 108* 03/19/2015 1456   CL 104 02/02/2015 1455   CL 106 01/22/2015 1447   CO2 26 03/19/2015 1456   CO2 27 02/02/2015 1455   CO2 26 01/22/2015 1447   CO2 27 12/29/2014 0758   BUN 19 03/19/2015 1456   BUN 15 02/02/2015 1455   BUN 9 01/22/2015 1447   BUN 20.4 12/29/2014 0758   CREATININE 0.80 03/19/2015 1456   CREATININE 1.4* 02/02/2015 1455   CREATININE 1.06* 01/22/2015 1447   CREATININE 1.1 12/29/2014 0758      Component Value Date/Time   CALCIUM 9.6 03/19/2015 1456   CALCIUM 10.0 02/02/2015 1455   CALCIUM 9.1 01/22/2015 1447   CALCIUM 10.1 12/29/2014 0758   ALKPHOS 84 03/19/2015 1456   ALKPHOS 86* 02/02/2015 1455   ALKPHOS 92 12/29/2014 0758   ALKPHOS 73 11/17/2014 0855   AST 23 03/19/2015 1456   AST 37 02/02/2015 1455   AST 26 12/29/2014 0758   AST 31 11/17/2014 0855   ALT 16 03/19/2015 1456   ALT 27 02/02/2015 1455   ALT 23 12/29/2014 0758   ALT 22 11/17/2014 0855   BILITOT 0.2 03/19/2015 1456   BILITOT 0.50 02/02/2015 1455   BILITOT 0.33 12/29/2014 0758   BILITOT 0.3 11/17/2014 0855         Impression and Plan: Kimberly [name] is a 75 year old postmenopausal female with stage IIb ductal carcinoma of the left breast. She had one positive lymph node. She had a very low Oncotype score so I do not think that there will be a role for chemotherapy.  I just feel bad at she's having the healing troubles from the mastectomy and implant. Unfortunately, she found had a have surgery to remove everything over on  the left side. She still has the drain in.  I have no idea when she can start radiation therapy.  She had the mastectomy. She had one positive lymph node. She had a low Oncotype score. Maybe, she will not need radiation.  I don't think I have to see her back except in 3 months. I really am  not adding much to her medical care right now.  Thankfully, she has a very strong faith that she uses to get her through the aggravation of all the surgeries and doctor's visit, etc.   I spent about 30 minutes with she and her family.   Volanda Napoleon, MD 1/26/20175:42 PM

## 2015-03-20 ENCOUNTER — Other Ambulatory Visit: Payer: Self-pay | Admitting: *Deleted

## 2015-03-20 ENCOUNTER — Other Ambulatory Visit: Payer: Self-pay | Admitting: Lab

## 2015-03-20 ENCOUNTER — Telehealth: Payer: Self-pay | Admitting: Oncology

## 2015-03-20 DIAGNOSIS — C50012 Malignant neoplasm of nipple and areola, left female breast: Secondary | ICD-10-CM

## 2015-03-20 LAB — VITAMIN D 25 HYDROXY (VIT D DEFICIENCY, FRACTURES): Vitamin D, 25-Hydroxy: 34.7 ng/mL (ref 30.0–100.0)

## 2015-03-20 NOTE — Telephone Encounter (Addendum)
-----   Message from Volanda Napoleon, MD sent at 03/20/2015  9:43 AM EST ----- Call - vit D level is ok!!  [facility] Left voicemail message.

## 2015-03-24 LAB — AEROBIC CULTURE

## 2015-04-23 ENCOUNTER — Other Ambulatory Visit: Payer: Self-pay | Admitting: *Deleted

## 2015-04-23 DIAGNOSIS — E559 Vitamin D deficiency, unspecified: Secondary | ICD-10-CM

## 2015-04-23 DIAGNOSIS — C50112 Malignant neoplasm of central portion of left female breast: Secondary | ICD-10-CM

## 2015-04-23 DIAGNOSIS — M818 Other osteoporosis without current pathological fracture: Secondary | ICD-10-CM

## 2015-04-23 DIAGNOSIS — T386X5A Adverse effect of antigonadotrophins, antiestrogens, antiandrogens, not elsewhere classified, initial encounter: Secondary | ICD-10-CM

## 2015-04-23 MED ORDER — LETROZOLE 2.5 MG PO TABS: 2.5000 mg | ORAL_TABLET | Freq: Every day | ORAL | Status: DC

## 2015-05-04 NOTE — Progress Notes (Signed)
[name] is back for a F/U new visit she has undergone multiple surgical procedures related to her left breast mastectomy from 11-20-14.  The most recent surgery was to remove the left breast tissue expander and replace the drain 03-09-15.  [name] presented questions about starting radiation to Dr. Burney Gauze during her 03-19-15 office follow up visit.   In the progress notes Dr. 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

## 2015-05-06 ENCOUNTER — Ambulatory Visit: Payer: Medicare Other | Admitting: Radiation Oncology

## 2015-05-07 ENCOUNTER — Ambulatory Visit: Admission: RE | Admit: 2015-05-07 | Payer: Medicare Other | Source: Ambulatory Visit | Admitting: Radiation Oncology

## 2015-05-07 ENCOUNTER — Ambulatory Visit
Admission: RE | Admit: 2015-05-07 | Discharge: 2015-05-07 | Disposition: A | Discharge status: Home / Self Care | Payer: Medicare Other | Source: Ambulatory Visit | Attending: Radiation Oncology | Admitting: Radiation Oncology

## 2015-05-07 ENCOUNTER — Encounter: Payer: Self-pay | Admitting: Radiation Oncology

## 2015-05-07 VITALS — BP 121/68 | HR 78 | Temp 97.9°F | Resp 16 | Ht 64.0 in | Wt 232.0 lb

## 2015-05-07 DIAGNOSIS — C50912 Malignant neoplasm of unspecified site of left female breast: Secondary | ICD-10-CM | POA: Diagnosis present

## 2015-05-07 DIAGNOSIS — Z510 Encounter for antineoplastic radiation therapy: Secondary | ICD-10-CM | POA: Insufficient documentation

## 2015-05-07 DIAGNOSIS — C50412 Malignant neoplasm of upper-outer quadrant of left female breast: Secondary | ICD-10-CM

## 2015-05-07 DIAGNOSIS — Z9012 Acquired absence of left breast and nipple: Secondary | ICD-10-CM | POA: Diagnosis not present

## 2015-05-07 DIAGNOSIS — Z170 Estrogen receptor positive status [ER+]: Secondary | ICD-10-CM | POA: Insufficient documentation

## 2015-05-07 NOTE — Addendum Note (Signed)
Encounter addended by: Malena Edman, RN on: 05/07/2015  3:51 PM<BR>     Documentation filed: Charges VN

## 2015-05-07 NOTE — Progress Notes (Signed)
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  Oncotype score is 7  Interval History: Kimberly [name] presents today for routine followup. Kimberly [name] is back for a F/U new visit she has undergone multiple surgical procedures related to her left breast mastectomy from 11-20-14.  Kimberly most recent surgery was to remove Kimberly left breast tissue expander and replace Kimberly drain 03-09-15.  Dr.Dillingham has cleared her from surgery. Dr.Ennever stated to Kimberly [name] that she will not need chemotherapy considering her low Oncotype score.   She is getting a total left knee replacement soon and seeing Kimberly orthopedic surgeon this afternoon.   She started letrozole 04-23-15  Pain: None today  Pain in knees at times takes Aleve Lymphedema:No Energy Level:Good denies fatigue  Appetite:Very good BP 121/68 mmHg  Pulse 78  Temp(Src) 97.9 F (36.6 C) (Oral)  Resp 16  Ht 5' 4"  (1.626 m)  Wt 232 lb (105.235 kg)  BMI 39.80 kg/m2  SpO2 99%   Physical Exam:  Filed Vitals:   05/07/15 0902  BP: 121/68  Pulse: 78  Temp: 97.9 F (36.6 C)  TempSrc: Oral  Resp: 16  Height: 5' 4"  (1.626 m)  Weight: 232 lb (105.235 kg)  SpO2: 99%  -Kimberly [name] is alert and oriented on today's visit. Her mastectomy incision is healing well. She has good range of motion in her bilateral arms.   IMPRESSION: Kimberly [name] is a 75 y.o. female postmenopausal female with stage IIb ductal carcinoma of Kimberly left breast. Despite, healing issues from mastectomy and implant, she was cleared from surgery by Dr.Dillingham. Kimberly [name] is appropriate to begin radiation at this time. Kimberly [name] was originally scheduled for simulation today however, Kimberly [name] needed to go an appointment for her left knee replacement. We will re-schedule  her simulation day.  PLAN: I spoke to Kimberly [name] today regarding her diagnosis and options for treatment. We discussed Kimberly equivalence in terms of survival and local failure between mastectomy and breast conservation. We discussed Kimberly role of radiation in decreasing local failures in patients who undergo mastectomy and have risk factors for recurrence including positive lymph nodes and/or tumors over 5 cm and/or positive margins. We discussed Kimberly process of simulation and Kimberly placement tattoos. We discussed 28 treatments as an outpatient to her chest wall and axilla. We discussed Kimberly possibility of asymptomatic lung damage. We discussed Kimberly low likelihood of secondary malignancies. We discussed Kimberly possible side effects including but not limited to skin redness, fatigue, permanent skin darkening, and chest wall swelling. We discussed increased complications that can occur with reconstruction after radiation.    We discussed that most patients treated on prospective trials were treated within 3 months of diagnosis and surgery which clearly she is over.  We really don't know Kimberly efficacy of radiation at 6 months post op but I don't think it is likely to have more side effects at this point.  She really does need Kimberly remaining lymph nodes addressed. She agrees and would like to proceed with treatment.   We have re-scheduled her simulation for March 22nd at 1pm. She has signed informed consent.   [name] lives in [place].  ------------------------------------------------  Thea Silversmith, MD  This document serves as a record of services personally performed by [facility]  Pablo Ledger, MD. It was created on her behalf by Derek Mound, a trained medical scribe. Kimberly creation of this record is based on Kimberly scribe's personal observations and Kimberly provider's statements to them. This document has been checked and approved by Kimberly attending provider.

## 2015-05-13 ENCOUNTER — Ambulatory Visit
Admission: RE | Admit: 2015-05-13 | Discharge: 2015-05-13 | Disposition: A | Discharge status: Home / Self Care | Payer: Medicare Other | Source: Ambulatory Visit | Attending: Radiation Oncology | Admitting: Radiation Oncology

## 2015-05-13 DIAGNOSIS — C50412 Malignant neoplasm of upper-outer quadrant of left female breast: Secondary | ICD-10-CM

## 2015-05-13 DIAGNOSIS — Z510 Encounter for antineoplastic radiation therapy: Secondary | ICD-10-CM | POA: Diagnosis not present

## 2015-05-13 NOTE — Progress Notes (Signed)
Radiation Oncology         [phone] ________________________________  Name: Kimberly [name]      MRN: [id number]          Date: 05/13/2015              DOB: 1940-09-08  Optical Surface Tracking Plan:  Since intensity modulated radiotherapy (IMRT) and 3D conformal radiation treatment methods are predicated on accurate and precise positioning for treatment, intrafraction motion monitoring is medically necessary to ensure accurate and safe treatment delivery.  The ability to quantify intrafraction motion without excessive ionizing radiation dose can only be performed with optical surface tracking. Accordingly, surface imaging offers the opportunity to obtain 3D measurements of patient position throughout IMRT and 3D treatments without excessive radiation exposure.  I am ordering optical surface tracking for this patient's upcoming course of radiotherapy.  ------------------------------------------------  Thea Silversmith, MD  Reference:   Particia Jasper, et al. Surface imaging-based analysis of intrafraction motion for breast radiotherapy patients.Journal of [place], n. 6, nov. 2014. ISSN [id number].   Available at: <http://www.jacmp.org/index.php/jacmp/article/view/4957>.   This document serves as a record of services personally performed by Thea Silversmith, MD. It was created on her behalf by Jenell Milliner, a trained medical scribe. The creation of this record is based on the scribe's personal observations and the provider's statements to them. This document has been checked and approved by the attending provider.

## 2015-05-13 NOTE — Progress Notes (Signed)
Name: Kimberly [name]   MRN: [id number]  Date:  05/13/2015  DOB: 26-Jul-1940  Status:outpatient    DIAGNOSIS: Breast cancer.  CONSENT VERIFIED: yes   SET UP: Patient is setup supine   IMMOBILIZATION:  The following immobilization was used:Custom Moldable Pillow, breast board.   NARRATIVE: [name] was brought to the [facility].  Identity was confirmed.  All relevant records and images related to the planned course of therapy were reviewed.  Then, the patient was positioned in a stable reproducible clinical set-up for radiation therapy.  Wires were placed to delineate the clinical extent of breast tissue. A wire was placed on the scar as well.  CT images were obtained.  An isocenter was placed. Skin markings were placed.  The CT images were loaded into the planning software where the target and avoidance structures were contoured.  The radiation prescription was entered and confirmed. The patient was discharged in stable condition and tolerated simulation well.    TREATMENT PLANNING NOTE:  Treatment planning then occurred. I have requested : MLC's, isodose plan, basic dose calculation  I personally designed and supervised the construction of 5 medically necessary complex treatment devices for the protection of critical normal structures including the lungs and contralateral breast as well as the immobilization device which is necessary for set up certainty.   TREATMENT PLANNING NOTE/3D Simulation Note Treatment planning then occurred. I have requested : MLC's, isodose plan, basic dose calculation  3D simulation was performed.  I personally constructed 6 complex treatment devices in the form of MLCs which will be used for beam modification and to protect critical structures including the heart and lung.  I have requested a dose volume histogram of the heart lung and tumor cavity.

## 2015-05-15 ENCOUNTER — Other Ambulatory Visit: Payer: Self-pay | Admitting: *Deleted

## 2015-05-15 DIAGNOSIS — M818 Other osteoporosis without current pathological fracture: Secondary | ICD-10-CM

## 2015-05-15 DIAGNOSIS — C50112 Malignant neoplasm of central portion of left female breast: Secondary | ICD-10-CM

## 2015-05-15 DIAGNOSIS — E559 Vitamin D deficiency, unspecified: Secondary | ICD-10-CM

## 2015-05-15 DIAGNOSIS — T386X5A Adverse effect of antigonadotrophins, antiestrogens, antiandrogens, not elsewhere classified, initial encounter: Secondary | ICD-10-CM

## 2015-05-15 MED ORDER — LETROZOLE 2.5 MG PO TABS: 2.5000 mg | ORAL_TABLET | Freq: Every day | ORAL | Status: DC

## 2015-05-19 DIAGNOSIS — Z510 Encounter for antineoplastic radiation therapy: Secondary | ICD-10-CM | POA: Diagnosis not present

## 2015-05-20 ENCOUNTER — Ambulatory Visit
Admission: RE | Admit: 2015-05-20 | Discharge: 2015-05-20 | Disposition: A | Discharge status: Home / Self Care | Payer: Medicare Other | Source: Ambulatory Visit | Attending: Radiation Oncology | Admitting: Radiation Oncology

## 2015-05-20 DIAGNOSIS — Z510 Encounter for antineoplastic radiation therapy: Secondary | ICD-10-CM | POA: Diagnosis not present

## 2015-05-21 ENCOUNTER — Ambulatory Visit
Admission: RE | Admit: 2015-05-21 | Discharge: 2015-05-21 | Disposition: A | Discharge status: Home / Self Care | Payer: Medicare Other | Source: Ambulatory Visit | Attending: Radiation Oncology | Admitting: Radiation Oncology

## 2015-05-21 DIAGNOSIS — Z510 Encounter for antineoplastic radiation therapy: Secondary | ICD-10-CM | POA: Diagnosis not present

## 2015-05-22 ENCOUNTER — Ambulatory Visit
Admission: RE | Admit: 2015-05-22 | Discharge: 2015-05-22 | Disposition: A | Discharge status: Home / Self Care | Payer: Medicare Other | Source: Ambulatory Visit | Attending: Radiation Oncology | Admitting: Radiation Oncology

## 2015-05-22 DIAGNOSIS — Z510 Encounter for antineoplastic radiation therapy: Secondary | ICD-10-CM | POA: Diagnosis not present

## 2015-05-25 ENCOUNTER — Ambulatory Visit
Admission: RE | Admit: 2015-05-25 | Discharge: 2015-05-25 | Disposition: A | Discharge status: Home / Self Care | Payer: Medicare Other | Source: Ambulatory Visit | Attending: Radiation Oncology | Admitting: Radiation Oncology

## 2015-05-25 DIAGNOSIS — Z510 Encounter for antineoplastic radiation therapy: Secondary | ICD-10-CM | POA: Diagnosis not present

## 2015-05-26 ENCOUNTER — Ambulatory Visit
Admission: RE | Admit: 2015-05-26 | Discharge: 2015-05-26 | Disposition: A | Discharge status: Home / Self Care | Payer: Medicare Other | Source: Ambulatory Visit | Attending: Radiation Oncology | Admitting: Radiation Oncology

## 2015-05-26 DIAGNOSIS — C50912 Malignant neoplasm of unspecified site of left female breast: Secondary | ICD-10-CM | POA: Insufficient documentation

## 2015-05-26 DIAGNOSIS — L859 Epidermal thickening, unspecified: Secondary | ICD-10-CM | POA: Insufficient documentation

## 2015-05-26 DIAGNOSIS — C50119 Malignant neoplasm of central portion of unspecified female breast: Secondary | ICD-10-CM

## 2015-05-26 DIAGNOSIS — Z923 Personal history of irradiation: Secondary | ICD-10-CM | POA: Insufficient documentation

## 2015-05-26 DIAGNOSIS — Z510 Encounter for antineoplastic radiation therapy: Secondary | ICD-10-CM | POA: Diagnosis not present

## 2015-05-26 DIAGNOSIS — Z170 Estrogen receptor positive status [ER+]: Secondary | ICD-10-CM | POA: Insufficient documentation

## 2015-05-26 MED ORDER — ALRA NON-METALLIC DEODORANT (RAD-ONC)
1.0000 "application " | Freq: Once | TOPICAL | Status: AC
  Administered 2015-05-26: 1 via TOPICAL

## 2015-05-26 MED ORDER — RADIAPLEXRX EX GEL
Freq: Once | CUTANEOUS | Status: AC
  Administered 2015-05-26: 11:00:00 via TOPICAL

## 2015-05-26 NOTE — Progress Notes (Signed)
Weekly Management Note Current Dose: 7.2  Gy  Projected Dose: 61 Gy   Narrative:  The patient presents for routine under treatment assessment.  CBCT/MVCT images/Port film x-rays were reviewed.  The chart was checked. Doing well. Daughter had questions about skin fold and some skin thickening.   Physical Findings: Moist area in skin fold laterally with dead skin. No signs of infection. Palpable tissue chnge inferior to scar is normal.   Impression:  The patient is tolerating radiation.  Plan:  Continue treatment as planned.

## 2015-05-26 NOTE — Addendum Note (Signed)
Encounter addended by: Malena Edman, RN on: 05/26/2015  5:11 PM<BR>     Documentation filed: Inpatient Patient Education

## 2015-05-27 ENCOUNTER — Ambulatory Visit
Admission: RE | Admit: 2015-05-27 | Discharge: 2015-05-27 | Disposition: A | Discharge status: Home / Self Care | Payer: Medicare Other | Source: Ambulatory Visit | Attending: Radiation Oncology | Admitting: Radiation Oncology

## 2015-05-27 DIAGNOSIS — Z510 Encounter for antineoplastic radiation therapy: Secondary | ICD-10-CM | POA: Diagnosis not present

## 2015-05-28 ENCOUNTER — Ambulatory Visit
Admission: RE | Admit: 2015-05-28 | Discharge: 2015-05-28 | Disposition: A | Discharge status: Home / Self Care | Payer: Medicare Other | Source: Ambulatory Visit | Attending: Radiation Oncology | Admitting: Radiation Oncology

## 2015-05-28 ENCOUNTER — Telehealth: Payer: Self-pay | Admitting: *Deleted

## 2015-05-28 DIAGNOSIS — Z510 Encounter for antineoplastic radiation therapy: Secondary | ICD-10-CM | POA: Diagnosis not present

## 2015-05-28 NOTE — Telephone Encounter (Signed)
Spoke with patient to follow up after start of radiation.  She states she is doing well.  Encouraged her to call with any needs or concerns. 

## 2015-05-29 ENCOUNTER — Ambulatory Visit
Admission: RE | Admit: 2015-05-29 | Discharge: 2015-05-29 | Disposition: A | Discharge status: Home / Self Care | Payer: Medicare Other | Source: Ambulatory Visit | Attending: Radiation Oncology | Admitting: Radiation Oncology

## 2015-05-29 DIAGNOSIS — Z510 Encounter for antineoplastic radiation therapy: Secondary | ICD-10-CM | POA: Diagnosis not present

## 2015-06-01 ENCOUNTER — Other Ambulatory Visit: Payer: Self-pay | Admitting: Nurse Practitioner

## 2015-06-01 ENCOUNTER — Ambulatory Visit
Admission: RE | Admit: 2015-06-01 | Discharge: 2015-06-01 | Disposition: A | Discharge status: Home / Self Care | Payer: Medicare Other | Source: Ambulatory Visit | Attending: Radiation Oncology | Admitting: Radiation Oncology

## 2015-06-01 DIAGNOSIS — E559 Vitamin D deficiency, unspecified: Secondary | ICD-10-CM

## 2015-06-01 DIAGNOSIS — C50112 Malignant neoplasm of central portion of left female breast: Secondary | ICD-10-CM

## 2015-06-01 DIAGNOSIS — Z510 Encounter for antineoplastic radiation therapy: Secondary | ICD-10-CM | POA: Diagnosis not present

## 2015-06-01 DIAGNOSIS — T386X5A Adverse effect of antigonadotrophins, antiestrogens, antiandrogens, not elsewhere classified, initial encounter: Secondary | ICD-10-CM

## 2015-06-01 DIAGNOSIS — M818 Other osteoporosis without current pathological fracture: Secondary | ICD-10-CM

## 2015-06-01 MED ORDER — LETROZOLE 2.5 MG PO TABS: 2.5000 mg | ORAL_TABLET | Freq: Every day | ORAL | Status: DC

## 2015-06-02 ENCOUNTER — Ambulatory Visit
Admission: RE | Admit: 2015-06-02 | Discharge: 2015-06-02 | Disposition: A | Discharge status: Home / Self Care | Payer: Medicare Other | Source: Ambulatory Visit | Attending: Radiation Oncology | Admitting: Radiation Oncology

## 2015-06-02 ENCOUNTER — Encounter: Payer: Self-pay | Admitting: Radiation Oncology

## 2015-06-02 VITALS — BP 119/90 | HR 69 | Temp 97.9°F | Resp 18 | Ht 64.0 in | Wt 225.1 lb

## 2015-06-02 DIAGNOSIS — C50412 Malignant neoplasm of upper-outer quadrant of left female breast: Secondary | ICD-10-CM

## 2015-06-02 DIAGNOSIS — Z510 Encounter for antineoplastic radiation therapy: Secondary | ICD-10-CM | POA: Diagnosis not present

## 2015-06-02 NOTE — Progress Notes (Signed)
Kimberly [name] has received 9 fractions to her left breast.  Skin to left breast looks normal using Radiaplex gel.  Appetite is good.  Energy level is fine.  Denies pain today. BP 119/90 mmHg  Pulse 69  Temp(Src) 97.9 F (36.6 C) (Oral)  Resp 18  Ht 5\' 4"  (1.626 m)  Wt 225 lb 1.6 oz (102.105 kg)  BMI 38.62 kg/m2  SpO2 99%

## 2015-06-02 NOTE — Progress Notes (Signed)
Weekly Management Note Current Dose: 16.2  Gy  Projected Dose: 61 Gy   Narrative:  The patient presents for routine under treatment assessment.  CBCT/MVCT images/Port film x-rays were reviewed.  The chart was checked. Doing well. Skin in fold is healing well.   Physical Findings: Weight: 225 lb 1.6 oz (102.105 kg). Unchanged  Impression:  The patient is tolerating radiation.  Plan:  Continue treatment as planned. Continue radiaplex.

## 2015-06-03 ENCOUNTER — Ambulatory Visit
Admission: RE | Admit: 2015-06-03 | Discharge: 2015-06-03 | Disposition: A | Discharge status: Home / Self Care | Payer: Medicare Other | Source: Ambulatory Visit | Attending: Radiation Oncology | Admitting: Radiation Oncology

## 2015-06-03 DIAGNOSIS — Z510 Encounter for antineoplastic radiation therapy: Secondary | ICD-10-CM | POA: Diagnosis not present

## 2015-06-04 ENCOUNTER — Ambulatory Visit
Admission: RE | Admit: 2015-06-04 | Discharge: 2015-06-04 | Disposition: A | Discharge status: Home / Self Care | Payer: Medicare Other | Source: Ambulatory Visit | Attending: Radiation Oncology | Admitting: Radiation Oncology

## 2015-06-04 DIAGNOSIS — Z510 Encounter for antineoplastic radiation therapy: Secondary | ICD-10-CM | POA: Diagnosis not present

## 2015-06-05 ENCOUNTER — Ambulatory Visit
Admission: RE | Admit: 2015-06-05 | Discharge: 2015-06-05 | Disposition: A | Discharge status: Home / Self Care | Payer: Medicare Other | Source: Ambulatory Visit | Attending: Radiation Oncology | Admitting: Radiation Oncology

## 2015-06-05 DIAGNOSIS — Z510 Encounter for antineoplastic radiation therapy: Secondary | ICD-10-CM | POA: Diagnosis not present

## 2015-06-08 ENCOUNTER — Ambulatory Visit
Admission: RE | Admit: 2015-06-08 | Discharge: 2015-06-08 | Disposition: A | Discharge status: Home / Self Care | Payer: Medicare Other | Source: Ambulatory Visit | Attending: Radiation Oncology | Admitting: Radiation Oncology

## 2015-06-08 DIAGNOSIS — Z510 Encounter for antineoplastic radiation therapy: Secondary | ICD-10-CM | POA: Diagnosis not present

## 2015-06-09 ENCOUNTER — Ambulatory Visit
Admission: RE | Admit: 2015-06-09 | Discharge: 2015-06-09 | Disposition: A | Discharge status: Home / Self Care | Payer: Medicare Other | Source: Ambulatory Visit | Attending: Radiation Oncology | Admitting: Radiation Oncology

## 2015-06-09 ENCOUNTER — Encounter: Payer: Self-pay | Admitting: Radiation Oncology

## 2015-06-09 VITALS — BP 113/69 | HR 69 | Temp 98.2°F | Ht 64.0 in | Wt 224.9 lb

## 2015-06-09 DIAGNOSIS — C50012 Malignant neoplasm of nipple and areola, left female breast: Secondary | ICD-10-CM | POA: Insufficient documentation

## 2015-06-09 DIAGNOSIS — Z510 Encounter for antineoplastic radiation therapy: Secondary | ICD-10-CM | POA: Diagnosis not present

## 2015-06-09 MED ORDER — RADIAPLEXRX EX GEL
Freq: Once | CUTANEOUS | Status: AC
  Administered 2015-06-09: 16:00:00 via TOPICAL

## 2015-06-09 NOTE — Progress Notes (Signed)
Kimberly [name] has received 14 fractions to her left chest wall. Note erythema of filed with intact skin.  Given new tube of Radiaplex

## 2015-06-09 NOTE — Addendum Note (Signed)
Encounter addended by: Benn Moulder, RN on: 06/09/2015  4:16 PM<BR>     Documentation filed: Inpatient MAR

## 2015-06-09 NOTE — Progress Notes (Signed)
Weekly Management Note Current Dose: 25.2  Gy  Projected Dose: 61 Gy   Narrative:  The patient presents for routine under treatment assessment.  CBCT/MVCT images/Port film x-rays were reviewed.  The chart was checked. Doing well.  Using radiaplex.  Physical Findings: Weight: 224 lb 14.4 oz (102.014 kg). Pink skin of left chest wall.   Impression:  The patient is tolerating radiation.  Plan:  Continue treatment as planned. Continue radiaplex.

## 2015-06-10 ENCOUNTER — Ambulatory Visit
Admission: RE | Admit: 2015-06-10 | Discharge: 2015-06-10 | Disposition: A | Discharge status: Home / Self Care | Payer: Medicare Other | Source: Ambulatory Visit | Attending: Radiation Oncology | Admitting: Radiation Oncology

## 2015-06-10 DIAGNOSIS — Z510 Encounter for antineoplastic radiation therapy: Secondary | ICD-10-CM | POA: Diagnosis not present

## 2015-06-11 ENCOUNTER — Ambulatory Visit
Admission: RE | Admit: 2015-06-11 | Discharge: 2015-06-11 | Disposition: A | Discharge status: Home / Self Care | Payer: Medicare Other | Source: Ambulatory Visit | Attending: Radiation Oncology | Admitting: Radiation Oncology

## 2015-06-11 DIAGNOSIS — Z510 Encounter for antineoplastic radiation therapy: Secondary | ICD-10-CM | POA: Diagnosis not present

## 2015-06-12 ENCOUNTER — Ambulatory Visit
Admission: RE | Admit: 2015-06-12 | Discharge: 2015-06-12 | Disposition: A | Discharge status: Home / Self Care | Payer: Medicare Other | Source: Ambulatory Visit | Attending: Radiation Oncology | Admitting: Radiation Oncology

## 2015-06-12 DIAGNOSIS — Z510 Encounter for antineoplastic radiation therapy: Secondary | ICD-10-CM | POA: Diagnosis not present

## 2015-06-15 ENCOUNTER — Ambulatory Visit
Admission: RE | Admit: 2015-06-15 | Discharge: 2015-06-15 | Disposition: A | Discharge status: Home / Self Care | Payer: Medicare Other | Source: Ambulatory Visit | Attending: Radiation Oncology | Admitting: Radiation Oncology

## 2015-06-15 DIAGNOSIS — Z510 Encounter for antineoplastic radiation therapy: Secondary | ICD-10-CM | POA: Diagnosis not present

## 2015-06-16 ENCOUNTER — Ambulatory Visit
Admission: RE | Admit: 2015-06-16 | Discharge: 2015-06-16 | Disposition: A | Discharge status: Home / Self Care | Payer: Medicare Other | Source: Ambulatory Visit | Attending: Radiation Oncology | Admitting: Radiation Oncology

## 2015-06-16 DIAGNOSIS — Z510 Encounter for antineoplastic radiation therapy: Secondary | ICD-10-CM | POA: Diagnosis not present

## 2015-06-16 DIAGNOSIS — C50012 Malignant neoplasm of nipple and areola, left female breast: Secondary | ICD-10-CM

## 2015-06-16 MED ORDER — RADIAPLEXRX EX GEL
Freq: Once | CUTANEOUS | Status: AC
  Administered 2015-06-16: 11:00:00 via TOPICAL

## 2015-06-16 NOTE — Progress Notes (Signed)
Weekly Management Note Current Dose: 34.2  Gy  Projected Dose: 61 Gy   Narrative:  The patient presents for routine under treatment assessment.  CBCT/MVCT images/Port film x-rays were reviewed.  The chart was checked. Doing well.  Using radiaplex. Some swelling and irritation.   Physical Findings: Weight:  . Pink skin of left chest wall.   Impression:  The patient is tolerating radiation.  Plan:  Continue treatment as planned. Continue radiaplex.

## 2015-06-17 ENCOUNTER — Ambulatory Visit
Admission: RE | Admit: 2015-06-17 | Discharge: 2015-06-17 | Disposition: A | Discharge status: Home / Self Care | Payer: Medicare Other | Source: Ambulatory Visit | Attending: Radiation Oncology | Admitting: Radiation Oncology

## 2015-06-17 DIAGNOSIS — Z510 Encounter for antineoplastic radiation therapy: Secondary | ICD-10-CM | POA: Diagnosis not present

## 2015-06-18 ENCOUNTER — Other Ambulatory Visit (HOSPITAL_BASED_OUTPATIENT_CLINIC_OR_DEPARTMENT_OTHER): Payer: Medicare Other

## 2015-06-18 ENCOUNTER — Encounter: Payer: Self-pay | Admitting: Hematology & Oncology

## 2015-06-18 ENCOUNTER — Ambulatory Visit
Admission: RE | Admit: 2015-06-18 | Discharge: 2015-06-18 | Disposition: A | Discharge status: Home / Self Care | Payer: Medicare Other | Source: Ambulatory Visit | Attending: Radiation Oncology | Admitting: Radiation Oncology

## 2015-06-18 ENCOUNTER — Ambulatory Visit (HOSPITAL_BASED_OUTPATIENT_CLINIC_OR_DEPARTMENT_OTHER): Payer: Medicare Other | Admitting: Hematology & Oncology

## 2015-06-18 VITALS — BP 123/85 | HR 87 | Temp 98.2°F | Resp 16 | Ht 64.0 in | Wt 222.0 lb

## 2015-06-18 DIAGNOSIS — M818 Other osteoporosis without current pathological fracture: Secondary | ICD-10-CM

## 2015-06-18 DIAGNOSIS — C50912 Malignant neoplasm of unspecified site of left female breast: Secondary | ICD-10-CM

## 2015-06-18 DIAGNOSIS — Z510 Encounter for antineoplastic radiation therapy: Secondary | ICD-10-CM | POA: Diagnosis not present

## 2015-06-18 DIAGNOSIS — T386X5A Adverse effect of antigonadotrophins, antiestrogens, antiandrogens, not elsewhere classified, initial encounter: Secondary | ICD-10-CM

## 2015-06-18 DIAGNOSIS — C50012 Malignant neoplasm of nipple and areola, left female breast: Secondary | ICD-10-CM

## 2015-06-18 DIAGNOSIS — C50112 Malignant neoplasm of central portion of left female breast: Secondary | ICD-10-CM

## 2015-06-18 LAB — COMPREHENSIVE METABOLIC PANEL
ALT: 18 U/L (ref 0–55)
ANION GAP: 8 meq/L (ref 3–11)
AST: 24 U/L (ref 5–34)
Albumin: 3.7 g/dL (ref 3.5–5.0)
Alkaline Phosphatase: 70 U/L (ref 40–150)
BILIRUBIN TOTAL: 0.33 mg/dL (ref 0.20–1.20)
BUN: 22.3 mg/dL (ref 7.0–26.0)
CO2: 27 meq/L (ref 22–29)
Calcium: 10.4 mg/dL (ref 8.4–10.4)
Chloride: 107 mEq/L (ref 98–109)
Creatinine: 0.9 mg/dL (ref 0.6–1.1)
EGFR: 65 mL/min/{1.73_m2} — AB (ref >90)
GLUCOSE: 91 mg/dL (ref 70–140)
POTASSIUM: 4.7 meq/L (ref 3.5–5.1)
SODIUM: 142 meq/L (ref 136–145)
TOTAL PROTEIN: 7 g/dL (ref 6.4–8.3)

## 2015-06-18 LAB — CBC WITH DIFFERENTIAL (CANCER CENTER ONLY)
BASO#: 0.1 10*3/uL (ref 0.0–0.2)
BASO%: 1.1 % (ref 0.0–2.0)
EOS ABS: 0.8 10*3/uL — AB (ref 0.0–0.5)
EOS%: 13.4 % — ABNORMAL HIGH (ref 0.0–7.0)
HCT: 42.4 % (ref 34.8–46.6)
HGB: 13.9 g/dL (ref 11.6–15.9)
LYMPH#: 0.9 10*3/uL (ref 0.9–3.3)
LYMPH%: 14.5 % (ref 14.0–48.0)
MCH: 28.4 pg (ref 26.0–34.0)
MCHC: 32.8 g/dL (ref 32.0–36.0)
MCV: 87 fL (ref 81–101)
MONO#: 0.7 10*3/uL (ref 0.1–0.9)
MONO%: 11.1 % (ref 0.0–13.0)
NEUT#: 3.7 10*3/uL (ref 1.5–6.5)
NEUT%: 59.9 % (ref 39.6–80.0)
PLATELETS: 245 10*3/uL (ref 145–400)
RBC: 4.9 10*6/uL (ref 3.70–5.32)
RDW: 15.3 % (ref 11.1–15.7)
WBC: 6.2 10*3/uL (ref 3.9–10.0)

## 2015-06-18 NOTE — Progress Notes (Signed)
Hematology and Oncology Follow Up Visit  Kimberly [name] [id number] 07-05-1940 75 y.o. 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  Oncotype score is 7  Current Therapy:    Femara 2.5 mg by mouth daily  Prolia 60 mg subcutaneous every 6 months-first dose today  [name] has 1 week left of radiation therapy     Interim History:  Kimberly [name] is back for follow-up. She is tolerating radiation therapy very well. She's having no problems with radiation. She has been somewhat treatments left.  She still is on Femara. She is doing well with Femara.   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 She will need surgery for this. It sounds like she probably will have surgery over the summer.  She nausea or vomiting. She's had no change in bowel or bladder habits.  She is taking vitamin D. She takes 2000 units a day.   Overall, her performance status is ECOG 1. Again, her biggest problem is the left knee. She definitely needs surgery for this.  Medications:  Current outpatient prescriptions:  .  acetaminophen (TYLENOL) 650 MG CR tablet, Take 1,300 mg by mouth. Takes 2 every am and 2 every pm, Disp: , Rfl:  .  Ascorbic Acid (VITAMIN C) 1000 MG tablet, Take by mouth., Disp: , Rfl:  .  aspirin EC 81 MG tablet, Take by mouth., Disp: , Rfl:  .  Calcium Carbonate (CALCIUM 600 PO), Take 1 tablet by mouth daily., Disp: , Rfl:  .  Cholecalciferol (D 2000) 2000 UNITS TABS, Take by mouth., Disp: , Rfl:  .  denosumab (PROLIA) 60 MG/ML SOLN injection, Inject 60 mg into the skin. Feel was taken 2nd week December 2016 every 6 months, Disp: , Rfl:  .  Glucosamine-Chondroit-Vit C-Mn (GLUCOSAMINE 1500 COMPLEX PO), Take 1 tablet by mouth daily., Disp: , Rfl:  .  hyaluronate sodium (RADIAPLEXRX) GEL, Apply 1 application topically 2 (two) times daily., Disp: , Rfl:  .  letrozole (FEMARA) 2.5 MG tablet, Take 1 tablet (2.5 mg total) by mouth daily.  90 day supply, Disp: 90 tablet, Rfl: 3 .  loratadine (CLARITIN) 10 MG tablet, Take 10 mg by mouth daily., Disp: , Rfl:  .  Multiple Vitamin (MULTI-VITAMINS) TABS, Take by mouth., Disp: , Rfl:  .  naproxen sodium (RA NAPROXEN SODIUM) 220 MG tablet, Take by mouth., Disp: , Rfl:  .  pyridOXINE (VITAMIN B-6) 100 MG tablet, Take 100 mg by mouth daily. Takes 4 tablets daily, Disp: , Rfl:  .  TURMERIC PO, Take 500 mg by mouth daily., Disp: , Rfl:  .  Zinc 50 MG CAPS, Take 1 capsule by mouth daily., Disp: , Rfl:   Allergies:  Allergies  Allergen Reactions  . Codeine Nausea And Vomiting    Past Medical History, Surgical history, Social history, and Family History were reviewed and updated.  Review of Systems: As above  Physical Exam:  height is 5' 4"  (1.626 m) and weight is 222 lb (100.699 kg). Her oral temperature is 98.2 F (36.8 C). Her blood pressure is 123/85 and her pulse is 87. Her respiration is 16.   Wt Readings from Last 3 Encounters:  06/18/15 222 lb (100.699 kg)  06/09/15 224 lb 14.4 oz (102.014 kg)  06/02/15 225 lb 1.6 oz (102.105 kg)     Head and neck exam shows no ocular or oral lesions. She has no palpable cervical or supraclavicular lymph nodes. Lungs are  clear. Cardiac exam regular rate and rhythm with no murmurs, rubs or bruits. Breast exam shows right breast with no masses, edema or erythema. There is no right axillary adenopathy. Left chest wall shows a drainage tube in. She has a healing mastectomy scar. No erythema is noted. No obvious fluid collection is noted. She has no palpable left axillary lymph node. Abdominal exam shows no fluid wave. There is no guarding or rebound tenderness. There is no abdominal mass. Back exam shows no tenderness over the spine, ribs or hips. Extremities shows no clubbing, cyanosis or edema. No lymphedema is noted in the arms. Skin exam shows no rashes, ecchymoses or petechia. Neurological exam is nonfoca  Lab Results  Component Value Date     WBC 6.2 06/18/2015   HGB 13.9 06/18/2015   HCT 42.4 06/18/2015   MCV 87 06/18/2015   PLT 245 06/18/2015     Chemistry      Component Value Date/Time   NA 140 03/19/2015 1456   NA 135 02/02/2015 1455   NA 141 01/22/2015 1447   NA 144 12/29/2014 0758   K 4.3 03/19/2015 1456   K 4.4 02/02/2015 1455   K 4.0 01/22/2015 1447   K 4.9 12/29/2014 0758   CL 108* 03/19/2015 1456   CL 104 02/02/2015 1455   CL 106 01/22/2015 1447   CO2 26 03/19/2015 1456   CO2 27 02/02/2015 1455   CO2 26 01/22/2015 1447   CO2 27 12/29/2014 0758   BUN 19 03/19/2015 1456   BUN 15 02/02/2015 1455   BUN 9 01/22/2015 1447   BUN 20.4 12/29/2014 0758   CREATININE 0.80 03/19/2015 1456   CREATININE 1.4* 02/02/2015 1455   CREATININE 1.06* 01/22/2015 1447   CREATININE 1.1 12/29/2014 0758      Component Value Date/Time   CALCIUM 9.6 03/19/2015 1456   CALCIUM 10.0 02/02/2015 1455   CALCIUM 9.1 01/22/2015 1447   CALCIUM 10.1 12/29/2014 0758   ALKPHOS 84 03/19/2015 1456   ALKPHOS 86* 02/02/2015 1455   ALKPHOS 92 12/29/2014 0758   ALKPHOS 73 11/17/2014 0855   AST 23 03/19/2015 1456   AST 37 02/02/2015 1455   AST 26 12/29/2014 0758   AST 31 11/17/2014 0855   ALT 16 03/19/2015 1456   ALT 27 02/02/2015 1455   ALT 23 12/29/2014 0758   ALT 22 11/17/2014 0855   BILITOT 0.2 03/19/2015 1456   BILITOT 0.50 02/02/2015 1455   BILITOT 0.33 12/29/2014 0758   BILITOT 0.3 11/17/2014 0855         Impression and Plan: Kimberly [name] is a 75 year old postmenopausal female with stage IIb ductal carcinoma of the left breast. She had one positive lymph node. She had a very low Oncotype score so I do not think that there will be a role for chemotherapy.  Her big problem right now is the left knee. This really is causing her aggravation. She definitely needs surgery for this. Hopefully, she'll have surgery for this in a couple months.  The Femara that she is on is very beneficial for her. She is taking vitamin D.  She  gets her Prolia in June.  I will plan to see her back in 6 weeks. If all looks good, then we can go every 3 months.   I spent about 30 minutes with her today.   Volanda Napoleon, MD 4/27/20173:06 PM

## 2015-06-19 ENCOUNTER — Ambulatory Visit: Payer: Medicare Other | Admitting: Hematology & Oncology

## 2015-06-19 ENCOUNTER — Other Ambulatory Visit: Payer: Medicare Other

## 2015-06-19 ENCOUNTER — Ambulatory Visit
Admission: RE | Admit: 2015-06-19 | Discharge: 2015-06-19 | Disposition: A | Discharge status: Home / Self Care | Payer: Medicare Other | Source: Ambulatory Visit | Attending: Radiation Oncology | Admitting: Radiation Oncology

## 2015-06-19 DIAGNOSIS — Z510 Encounter for antineoplastic radiation therapy: Secondary | ICD-10-CM | POA: Diagnosis not present

## 2015-06-22 ENCOUNTER — Ambulatory Visit
Admission: RE | Admit: 2015-06-22 | Discharge: 2015-06-22 | Disposition: A | Discharge status: Home / Self Care | Payer: Medicare Other | Source: Ambulatory Visit | Attending: Radiation Oncology | Admitting: Radiation Oncology

## 2015-06-22 DIAGNOSIS — Z510 Encounter for antineoplastic radiation therapy: Secondary | ICD-10-CM | POA: Diagnosis not present

## 2015-06-23 ENCOUNTER — Ambulatory Visit
Admission: RE | Admit: 2015-06-23 | Discharge: 2015-06-23 | Disposition: A | Discharge status: Home / Self Care | Payer: Medicare Other | Source: Ambulatory Visit | Attending: Radiation Oncology | Admitting: Radiation Oncology

## 2015-06-23 ENCOUNTER — Encounter: Payer: Self-pay | Admitting: Radiation Oncology

## 2015-06-23 VITALS — BP 103/70 | HR 73 | Temp 97.8°F | Ht 64.0 in | Wt 219.9 lb

## 2015-06-23 DIAGNOSIS — Z510 Encounter for antineoplastic radiation therapy: Secondary | ICD-10-CM | POA: Diagnosis not present

## 2015-06-23 DIAGNOSIS — C50412 Malignant neoplasm of upper-outer quadrant of left female breast: Secondary | ICD-10-CM

## 2015-06-23 NOTE — Addendum Note (Signed)
Encounter addended by: Malena Edman, RN on: 06/23/2015  2:55 PM<BR>     Documentation filed: Inpatient Patient Education

## 2015-06-23 NOTE — Progress Notes (Signed)
Kimberly [name] has received  24 fractions to her LCW.  Skin to LCW has redness using Radiaplex gel bid.  Appetite is very good.  Denies fatigue and pain.  EOT done  Today. BP 103/70 mmHg  Pulse 73  Temp(Src) 97.8 F (36.6 C) (Oral)  Ht 5\' 4"  (1.626 m)  Wt 219 lb 14.4 oz (99.746 kg)  BMI 37.73 kg/m2  SpO2 100%

## 2015-06-23 NOTE — Progress Notes (Signed)
Weekly Management Note Current Dose: 43.2  Gy  Projected Dose: 50.4 Gy   Narrative:  The patient presents for routine under treatment assessment.  CBCT/MVCT images/Port film x-rays were reviewed.  The chart was checked.  Kimberly [name] has received 24 fractions to her LCW. Skin to LCW has redness using Radiaplex gel bid. Appetite is very good. Denies fatigue and pain. EOT done today.   Physical Findings: Weight: 219 lb 14.4 oz (99.746 kg). Dermatitis over the left chest wall. No moist desquamation.  Impression:  The patient is tolerating radiation.  Plan:  Continue treatment as planned. Continue radiaplex. Post RT skin care performed. Discussed survivorship.   ------------------------------------------------  Thea Silversmith, MD  This document serves as a record of services personally performed by Thea Silversmith, MD. It was created on her behalf by Arlyce Harman, a trained medical scribe. The creation of this record is based on the scribe's personal observations and the provider's statements to them. This document has been checked and approved by the attending provider.

## 2015-06-24 ENCOUNTER — Ambulatory Visit
Admission: RE | Admit: 2015-06-24 | Discharge: 2015-06-24 | Disposition: A | Discharge status: Home / Self Care | Payer: Medicare Other | Source: Ambulatory Visit | Attending: Radiation Oncology | Admitting: Radiation Oncology

## 2015-06-24 DIAGNOSIS — Z510 Encounter for antineoplastic radiation therapy: Secondary | ICD-10-CM | POA: Diagnosis not present

## 2015-06-25 ENCOUNTER — Ambulatory Visit
Admission: RE | Admit: 2015-06-25 | Discharge: 2015-06-25 | Disposition: A | Discharge status: Home / Self Care | Payer: Medicare Other | Source: Ambulatory Visit | Attending: Radiation Oncology | Admitting: Radiation Oncology

## 2015-06-25 DIAGNOSIS — Z510 Encounter for antineoplastic radiation therapy: Secondary | ICD-10-CM | POA: Diagnosis not present

## 2015-06-26 ENCOUNTER — Ambulatory Visit
Admission: RE | Admit: 2015-06-26 | Discharge: 2015-06-26 | Disposition: A | Discharge status: Home / Self Care | Payer: Medicare Other | Source: Ambulatory Visit | Attending: Radiation Oncology | Admitting: Radiation Oncology

## 2015-06-26 DIAGNOSIS — Z510 Encounter for antineoplastic radiation therapy: Secondary | ICD-10-CM | POA: Diagnosis not present

## 2015-06-29 ENCOUNTER — Encounter: Payer: Self-pay | Admitting: Radiation Oncology

## 2015-06-29 ENCOUNTER — Ambulatory Visit
Admission: RE | Admit: 2015-06-29 | Discharge: 2015-06-29 | Disposition: A | Discharge status: Home / Self Care | Payer: Medicare Other | Source: Ambulatory Visit | Attending: Radiation Oncology | Admitting: Radiation Oncology

## 2015-06-29 ENCOUNTER — Ambulatory Visit
Admission: RE | Admit: 2015-06-29 | Payer: Medicare Other | Source: Ambulatory Visit | Attending: Radiation Oncology | Admitting: Radiation Oncology

## 2015-06-29 ENCOUNTER — Telehealth: Payer: Self-pay | Admitting: *Deleted

## 2015-06-29 VITALS — BP 106/67 | HR 68 | Temp 97.8°F | Ht 64.0 in | Wt 217.3 lb

## 2015-06-29 DIAGNOSIS — C50112 Malignant neoplasm of central portion of left female breast: Secondary | ICD-10-CM

## 2015-06-29 DIAGNOSIS — Z510 Encounter for antineoplastic radiation therapy: Secondary | ICD-10-CM | POA: Diagnosis not present

## 2015-06-29 MED ORDER — RADIAPLEXRX EX GEL
Freq: Once | CUTANEOUS | Status: AC
  Administered 2015-06-29: 10:00:00 via TOPICAL

## 2015-06-29 NOTE — Addendum Note (Signed)
Encounter addended by: Benn Moulder, RN on: 06/29/2015  5:34 PM<BR>     Documentation filed: [employer], Inpatient MAR, Orders

## 2015-06-29 NOTE — Progress Notes (Signed)
   Weekly Management Note  outpatient    ICD-9-CM ICD-10-CM   1. Malignant neoplasm of central portion of left female breast (HCC) 174.1 C50.112     Completed Radiotherapy. Total Dose: 50.4 Gy to left chest wall  Narrative:  The patient presents for routine under treatment assessment on last day of radiotherapy.  CBCT/MVCT images/Port film x-rays were reviewed.  The chart was checked. Doing well.  Uses hydrocortisone 1% cream PRN with Radiaplex  Physical Findings:  height is 5\' 4"  (1.626 m) and weight is 217 lb 4.8 oz (98.567 kg). Her oral temperature is 97.8 F (36.6 C). Her blood pressure is 106/67 and her pulse is 68. Her oxygen saturation is 100%.   NAD, bright erythema over left chest wall, skin intact  Impression:  The patient has tolerated radiotherapy.  Plan:  Routine follow-up in one month.   ________________________________   Eppie Gibson, M.D.

## 2015-06-29 NOTE — Telephone Encounter (Signed)
  Oncology Nurse Navigator Documentation    Navigator Encounter Type: Telephone (06/29/15 1300) Telephone: [place] Call (06/29/15 1300)     Surgery Date: 11/20/14 (06/29/15 1300) Treatment Initiated Date: 11/20/14 (06/29/15 1300) Patient Visit Type: RadOnc (06/29/15 1300) Treatment Phase: Final Radiation Tx (06/29/15 1300)     Interventions: Referrals (06/29/15 1300) Referrals: Survivorship (06/29/15 1300)                    Time Spent with Patient: 15 (06/29/15 1300)

## 2015-06-29 NOTE — Progress Notes (Addendum)
Mrs. Komm has received 28 fractions to her LCW.  Skin to LCW  red tenderness under axilla using Radiaplex gel bid.  Occasional itching to chest area using Hydrocortisone as needed. Appetite is good.  Denies fatigue and pain. BP 106/67 mmHg  Pulse 68  Temp(Src) 97.8 F (36.6 C) (Oral)  Ht 5\' 4"  (1.626 m)  Wt 217 lb 4.8 oz (98.567 kg)  BMI 37.28 kg/m2  SpO2 100%

## 2015-07-06 ENCOUNTER — Other Ambulatory Visit: Payer: Self-pay | Admitting: Adult Health

## 2015-07-06 DIAGNOSIS — C50112 Malignant neoplasm of central portion of left female breast: Secondary | ICD-10-CM

## 2015-07-08 ENCOUNTER — Telehealth: Payer: Self-pay | Admitting: *Deleted

## 2015-07-08 ENCOUNTER — Encounter: Payer: Self-pay | Admitting: *Deleted

## 2015-07-08 NOTE — Progress Notes (Signed)
Spoke with Kimberly [name] to let her know it was okay to proceed with her knee replacement on 09-06-15 with Dr. Wynelle Link per Dr. Pablo Ledger.

## 2015-07-23 NOTE — Progress Notes (Addendum)
  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  Indication for treatment:  Curative      Radiation treatment dates:  3/3-/17- 06/29/15  Site/dose:   Left chest wall / 50.4 Gray @ 1.8 [name] per fraction x 28 fractions Left Supraclavicular fossa /PAB 26 Gray '@1'$ .8 Gray per fraction x 25 fractions  Beams/Energies: Opposed tangents with reduced fields/ 6 MV photons RAO/PA  6 MV photons  Narrative: The patient tolerated radiation treatment relatively well.   She had erythema with dermatitis but no moist desquamation.   Plan: The patient has completed radiation treatment. The patient will return to radiation oncology clinic for routine followup in one month. I advised them to call or return sooner if they have any questions or concerns related to their recovery or treatment.  ------------------------------------------------  Thea Silversmith, MD  This document serves as a record of services personally performed by Thea Silversmith, MD. It was created on her behalf by Derek Mound, a trained medical scribe. The creation of this record is based on the scribe's personal observations and the provider's statements to them. This document has been checked and approved by the attending provider.

## 2015-07-29 ENCOUNTER — Encounter: Payer: Self-pay | Admitting: Hematology & Oncology

## 2015-07-29 ENCOUNTER — Ambulatory Visit (HOSPITAL_BASED_OUTPATIENT_CLINIC_OR_DEPARTMENT_OTHER): Payer: Medicare Other | Admitting: Hematology & Oncology

## 2015-07-29 ENCOUNTER — Ambulatory Visit (HOSPITAL_BASED_OUTPATIENT_CLINIC_OR_DEPARTMENT_OTHER): Payer: Medicare Other

## 2015-07-29 ENCOUNTER — Other Ambulatory Visit (HOSPITAL_BASED_OUTPATIENT_CLINIC_OR_DEPARTMENT_OTHER): Payer: Medicare Other

## 2015-07-29 VITALS — BP 122/89 | HR 81 | Temp 97.8°F | Resp 14 | Ht 64.0 in | Wt 214.0 lb

## 2015-07-29 DIAGNOSIS — C50912 Malignant neoplasm of unspecified site of left female breast: Secondary | ICD-10-CM

## 2015-07-29 DIAGNOSIS — C50112 Malignant neoplasm of central portion of left female breast: Secondary | ICD-10-CM

## 2015-07-29 DIAGNOSIS — C773 Secondary and unspecified malignant neoplasm of axilla and upper limb lymph nodes: Secondary | ICD-10-CM

## 2015-07-29 DIAGNOSIS — T386X5A Adverse effect of antigonadotrophins, antiestrogens, antiandrogens, not elsewhere classified, initial encounter: Secondary | ICD-10-CM

## 2015-07-29 DIAGNOSIS — Z79811 Long term (current) use of aromatase inhibitors: Secondary | ICD-10-CM | POA: Diagnosis not present

## 2015-07-29 DIAGNOSIS — M818 Other osteoporosis without current pathological fracture: Secondary | ICD-10-CM

## 2015-07-29 DIAGNOSIS — N951 Menopausal and female climacteric states: Secondary | ICD-10-CM

## 2015-07-29 LAB — CBC WITH DIFFERENTIAL (CANCER CENTER ONLY)
BASO#: 0.1 10*3/uL (ref 0.0–0.2)
BASO%: 1 % (ref 0.0–2.0)
EOS%: 9.6 % — AB (ref 0.0–7.0)
Eosinophils Absolute: 0.6 10*3/uL — ABNORMAL HIGH (ref 0.0–0.5)
HEMATOCRIT: 43.3 % (ref 34.8–46.6)
HGB: 14.1 g/dL (ref 11.6–15.9)
LYMPH#: 1 10*3/uL (ref 0.9–3.3)
LYMPH%: 17.1 % (ref 14.0–48.0)
MCH: 28.5 pg (ref 26.0–34.0)
MCHC: 32.6 g/dL (ref 32.0–36.0)
MCV: 88 fL (ref 81–101)
MONO#: 0.7 10*3/uL (ref 0.1–0.9)
MONO%: 11.3 % (ref 0.0–13.0)
NEUT#: 3.6 10*3/uL (ref 1.5–6.5)
NEUT%: 61 % (ref 39.6–80.0)
PLATELETS: 272 10*3/uL (ref 145–400)
RBC: 4.94 10*6/uL (ref 3.70–5.32)
RDW: 14.8 % (ref 11.1–15.7)
WBC: 5.9 10*3/uL (ref 3.9–10.0)

## 2015-07-29 LAB — CMP (CANCER CENTER ONLY)
ALT(SGPT): 24 U/L (ref 10–47)
AST: 30 U/L (ref 11–38)
Albumin: 3.7 g/dL (ref 3.3–5.5)
Alkaline Phosphatase: 62 U/L (ref 26–84)
BUN: 21 mg/dL (ref 7–22)
CALCIUM: 10.7 mg/dL — AB (ref 8.0–10.3)
CO2: 28 meq/L (ref 18–33)
Chloride: 103 mEq/L (ref 98–108)
Creat: 0.9 mg/dl (ref 0.6–1.2)
GLUCOSE: 91 mg/dL (ref 73–118)
POTASSIUM: 4.5 meq/L (ref 3.3–4.7)
Sodium: 140 mEq/L (ref 128–145)
Total Bilirubin: 0.6 mg/dl (ref 0.20–1.60)
Total Protein: 7.1 g/dL (ref 6.4–8.1)

## 2015-07-29 MED ORDER — DENOSUMAB 60 MG/ML ~~LOC~~ SOLN
60.0000 mg | Freq: Once | SUBCUTANEOUS | Status: AC
  Administered 2015-07-29: 60 mg via SUBCUTANEOUS
  Filled 2015-07-29: qty 1

## 2015-07-29 MED ORDER — SODIUM CHLORIDE 0.9 % IV SOLN: Freq: Once | INTRAVENOUS | Status: DC

## 2015-07-29 MED ORDER — SODIUM CHLORIDE 0.9 % IV SOLN: Freq: Once | INTRAVENOUS | Status: DC | PRN

## 2015-07-29 MED ORDER — METHYLPREDNISOLONE SODIUM SUCC 125 MG IJ SOLR: 125.0000 mg | Freq: Once | INTRAMUSCULAR | Status: DC | PRN

## 2015-07-29 MED ORDER — EPINEPHRINE HCL 0.1 MG/ML IJ SOLN: 0.2500 mg | Freq: Once | INTRAMUSCULAR | Status: DC | PRN

## 2015-07-29 MED ORDER — DIPHENHYDRAMINE HCL 50 MG/ML IJ SOLN: 25.0000 mg | Freq: Once | INTRAMUSCULAR | Status: DC | PRN

## 2015-07-29 MED ORDER — DIPHENHYDRAMINE HCL 50 MG/ML IJ SOLN: 50.0000 mg | Freq: Once | INTRAMUSCULAR | Status: DC | PRN

## 2015-07-29 MED ORDER — ALBUTEROL SULFATE (2.5 MG/3ML) 0.083% IN NEBU
2.5000 mg | INHALATION_SOLUTION | Freq: Once | RESPIRATORY_TRACT | Status: DC | PRN
  Filled 2015-07-29: qty 3

## 2015-07-29 NOTE — Patient Instructions (Signed)
Denosumab injection  What is this medicine?  DENOSUMAB (den oh sue mab) slows bone breakdown. Prolia is used to treat osteoporosis in women after menopause and in men. Xgeva is used to prevent bone fractures and other bone problems caused by cancer bone metastases. Xgeva is also used to treat giant cell tumor of the bone.  This medicine may be used for other purposes; ask your health care provider or pharmacist if you have questions.  What should I tell my health care provider before I take this medicine?  They need to know if you have any of these conditions:  -dental disease  -eczema  -infection or history of infections  -kidney disease or on dialysis  -low blood calcium or vitamin D  -malabsorption syndrome  -scheduled to have surgery or tooth extraction  -taking medicine that contains denosumab  -thyroid or parathyroid disease  -an unusual reaction to denosumab, other medicines, foods, dyes, or preservatives  -pregnant or trying to get pregnant  -breast-feeding  How should I use this medicine?  This medicine is for injection under the skin. It is given by a health care professional in a hospital or clinic setting.  If you are getting Prolia, a special MedGuide will be given to you by the pharmacist with each prescription and refill. Be sure to read this information carefully each time.  For Prolia, talk to your pediatrician regarding the use of this medicine in children. Special care may be needed. For Xgeva, talk to your pediatrician regarding the use of this medicine in children. While this drug may be prescribed for children as young as 13 years for selected conditions, precautions do apply.  Overdosage: If you think you have taken too much of this medicine contact a poison control center or emergency room at once.  NOTE: This medicine is only for you. Do not share this medicine with others.  What if I miss a dose?  It is important not to miss your dose. Call your doctor or health care professional if you are  unable to keep an appointment.  What may interact with this medicine?  Do not take this medicine with any of the following medications:  -other medicines containing denosumab  This medicine may also interact with the following medications:  -medicines that suppress the immune system  -medicines that treat cancer  -steroid medicines like prednisone or cortisone  This list may not describe all possible interactions. Give your health care provider a list of all the medicines, herbs, non-prescription drugs, or dietary supplements you use. Also tell them if you smoke, drink alcohol, or use illegal drugs. Some items may interact with your medicine.  What should I watch for while using this medicine?  Visit your doctor or health care professional for regular checks on your progress. Your doctor or health care professional may order blood tests and other tests to see how you are doing.  Call your doctor or health care professional if you get a cold or other infection while receiving this medicine. Do not treat yourself. This medicine may decrease your body's ability to fight infection.  You should make sure you get enough calcium and vitamin D while you are taking this medicine, unless your doctor tells you not to. Discuss the foods you eat and the vitamins you take with your health care professional.  See your dentist regularly. Brush and floss your teeth as directed. Before you have any dental work done, tell your dentist you are receiving this medicine.  Do   not become pregnant while taking this medicine or for 5 months after stopping it. Women should inform their doctor if they wish to become pregnant or think they might be pregnant. There is a potential for serious side effects to an unborn child. Talk to your health care professional or pharmacist for more information.  What side effects may I notice from receiving this medicine?  Side effects that you should report to your doctor or health care professional as soon as  possible:  -allergic reactions like skin rash, itching or hives, swelling of the face, lips, or tongue  -breathing problems  -chest pain  -fast, irregular heartbeat  -feeling faint or lightheaded, falls  -fever, chills, or any other sign of infection  -muscle spasms, tightening, or twitches  -numbness or tingling  -skin blisters or bumps, or is dry, peels, or red  -slow healing or unexplained pain in the mouth or jaw  -unusual bleeding or bruising  Side effects that usually do not require medical attention (Report these to your doctor or health care professional if they continue or are bothersome.):  -muscle pain  -stomach upset, gas  This list may not describe all possible side effects. Call your doctor for medical advice about side effects. You may report side effects to FDA at 1-800-FDA-1088.  Where should I keep my medicine?  This medicine is only given in a clinic, doctor's office, or other health care setting and will not be stored at home.  NOTE: This sheet is a summary. It may not cover all possible information. If you have questions about this medicine, talk to your doctor, pharmacist, or health care provider.      2016, [employer]vier/Gold Standard. (2011-08-08 12:37:47)

## 2015-07-29 NOTE — Progress Notes (Signed)
Hematology and Oncology Follow Up Visit  Kimberly [name] [id number] 01-15-1941 75 y.o. 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  Oncotype score is 7  Current Therapy:    Femara 2.5 mg by mouth daily  Prolia 60 mg subcutaneous every 6 months-first dose today  Patient has 1 week left of radiation therapy     Interim History:  Kimberly [name] is back for follow-up. She'll treat her radiation back in early May. She did well with this. She'll a little bit of radiation dermatitis.  The big news that she will have her knee surgery for the left knee in July. This will be July 26. She will be in the hospital for a couple days. I will have to stop by to see her.  She is doing well with Femara. She's had no proximal with Femara.  There's been no arthralgias, outside of that with her left knee.  She has had no change in bowel or bladder habits.  She hasn't no fever. There's been no cough. She's had no increasing fatigue or weakness.  Overall, her performance status is ECOG 1.   Medications:  Current outpatient prescriptions:  .  acetaminophen (TYLENOL) 650 MG CR tablet, Take 1,300 mg by mouth. Take 2 pills in the PM, Disp: , Rfl:  .  Ascorbic Acid (VITAMIN C) 1000 MG tablet, Take by mouth., Disp: , Rfl:  .  Calcium Carbonate (CALCIUM 600 PO), Take 1 tablet by mouth daily., Disp: , Rfl:  .  Cholecalciferol (D 2000) 2000 UNITS TABS, Take by mouth., Disp: , Rfl:  .  denosumab (PROLIA) 60 MG/ML SOLN injection, Inject 60 mg into the skin. Feel was taken 2nd week December 2016 every 6 months, Disp: , Rfl:  .  Glucosamine-Chondroit-Vit C-Mn (GLUCOSAMINE 1500 COMPLEX PO), Take 1 tablet by mouth daily., Disp: , Rfl:  .  hyaluronate sodium (RADIAPLEXRX) GEL, Apply 1 application topically 2 (two) times daily., Disp: , Rfl:  .  letrozole (FEMARA) 2.5 MG tablet, Take 1 tablet (2.5 mg total) by mouth daily. 90 day supply, Disp: 90 tablet,  Rfl: 3 .  loratadine (CLARITIN) 10 MG tablet, Take 10 mg by mouth daily., Disp: , Rfl:  .  Multiple Vitamin (MULTI-VITAMINS) TABS, Take by mouth., Disp: , Rfl:  .  naproxen sodium (RA NAPROXEN SODIUM) 220 MG tablet, Take by mouth., Disp: , Rfl:  .  OVER THE COUNTER MEDICATION, Vitamin E cream, Disp: , Rfl:  .  pyridOXINE (VITAMIN B-6) 100 MG tablet, Take 100 mg by mouth daily. Takes 4 tablets daily, Disp: , Rfl:  .  TURMERIC PO, Take 500 mg by mouth daily., Disp: , Rfl:  .  Turmeric POWD, , Disp: , Rfl:  .  Zinc 50 MG CAPS, Take 1 capsule by mouth daily., Disp: , Rfl:  .  aspirin EC 81 MG tablet, Take by mouth., Disp: , Rfl:   Allergies:  Allergies  Allergen Reactions  . Codeine Nausea And Vomiting    Past Medical History, Surgical history, Social history, and Family History were reviewed and updated.  Review of Systems: As above  Physical Exam:  height is _0  (1.626 m) and weight is 214 lb (97.07 kg). Her oral temperature is 97.8 F (36.6 C). Her blood pressure is 122/89 and her pulse is 81. Her respiration is 14.   Wt Readings from Last 3 Encounters:  07/29/15 214 lb (97.07 kg)  06/29/15 217 lb 4.8 oz (98.567  kg)  06/23/15 219 lb 14.4 oz (99.746 kg)     Head and neck exam shows no ocular or oral lesions. She has no palpable cervical or supraclavicular lymph nodes. Lungs are clear. Cardiac exam regular rate and rhythm with no murmurs, rubs or bruits. Breast exam shows right breast with no masses, edema or erythema. There is no right axillary adenopathy. Left chest wall shows a drainage tube in. She has a healing mastectomy scar. No erythema is noted. No obvious fluid collection is noted. She has no palpable left axillary lymph node. Abdominal exam shows no fluid wave. There is no guarding or rebound tenderness. There is no abdominal mass. Back exam shows no tenderness over the spine, ribs or hips. Extremities shows no clubbing, cyanosis or edema. No lymphedema is noted in the  arms. Skin exam shows no rashes, ecchymoses or petechia. Neurological exam is nonfoca  Lab Results  Component Value Date   WBC 5.9 07/29/2015   HGB 14.1 07/29/2015   HCT 43.3 07/29/2015   MCV 88 07/29/2015   PLT 272 07/29/2015     Chemistry      Component Value Date/Time   NA 140 07/29/2015 1301   NA 142 06/18/2015 1333   NA 140 03/19/2015 1456   NA 141 01/22/2015 1447   K 4.5 07/29/2015 1301   K 4.7 06/18/2015 1333   K 4.3 03/19/2015 1456   K 4.0 01/22/2015 1447   CL 103 07/29/2015 1301   CL 108* 03/19/2015 1456   CL 106 01/22/2015 1447   CO2 28 07/29/2015 1301   CO2 27 06/18/2015 1333   CO2 26 03/19/2015 1456   CO2 26 01/22/2015 1447   BUN 21 07/29/2015 1301   BUN 22.3 06/18/2015 1333   BUN 19 03/19/2015 1456   BUN 9 01/22/2015 1447   CREATININE 0.9 07/29/2015 1301   CREATININE 0.9 06/18/2015 1333   CREATININE 0.80 03/19/2015 1456   CREATININE 1.06* 01/22/2015 1447      Component Value Date/Time   CALCIUM 10.7* 07/29/2015 1301   CALCIUM 10.4 06/18/2015 1333   CALCIUM 9.6 03/19/2015 1456   CALCIUM 9.1 01/22/2015 1447   ALKPHOS 62 07/29/2015 1301   ALKPHOS 70 06/18/2015 1333   ALKPHOS 84 03/19/2015 1456   ALKPHOS 73 11/17/2014 0855   AST 30 07/29/2015 1301   AST 24 06/18/2015 1333   AST 23 03/19/2015 1456   AST 31 11/17/2014 0855   ALT 24 07/29/2015 1301   ALT 18 06/18/2015 1333   ALT 16 03/19/2015 1456   ALT 22 11/17/2014 0855   BILITOT 0.60 07/29/2015 1301   BILITOT 0.33 06/18/2015 1333   BILITOT 0.2 03/19/2015 1456   BILITOT 0.3 11/17/2014 0855         Impression and Plan: Kimberly [name] is a 75 year old postmenopausal female with stage IIb ductal carcinoma of the left breast. She had one positive lymph node. She had a very low Oncotype score so I do not think that there will be a role for chemotherapy.  Her big problem right now is the left knee. This really is causing her aggravation. She definitely needs surgery for this. Hopefully, she'll have  surgery for this in a couple months.  The Femara that she is on is very beneficial for her. She is taking vitamin D.  She gets her Prolia today.  I do not see any problems with her having knee surgery in July. Hopefully this will allow her to get around much better.  I'll plan to  see her back in 3 months. By then, she should be able to get around okay.  I spent about 30 minutes with her today.   Volanda Napoleon, MD 6/7/20172:09 PM

## 2015-07-30 ENCOUNTER — Telehealth: Payer: Self-pay | Admitting: *Deleted

## 2015-07-30 LAB — VITAMIN D 25 HYDROXY (VIT D DEFICIENCY, FRACTURES): VIT D 25 HYDROXY: 53.1 ng/mL (ref 30.0–100.0)

## 2015-07-30 NOTE — Telephone Encounter (Addendum)
Patient is aware of results.   ----- Message from Volanda Napoleon, MD sent at 07/30/2015  7:00 AM EDT ----- Call - Vit D level is great!!  Kimberly [name]

## 2015-07-30 NOTE — Progress Notes (Addendum)
Mrs. Kimberly [name] is here for a one month for follow up visit for breast cancer of upper-inner quadrant of left female breast.  Skin status:LCW still has tanning. Lotion being used: Lotion with vitamin E Gold bond Have you seen your medical oncologist? Date If not ,when is appointment 11-04-15 Dr. Burney Gauze When is yiur next mammogram? Has it been scheduled ? : Not yet last one was July 2016 ER+,have started AI or Tamoxifen? If not, why?  01-2015 Letrozole Discuss survivorship appointment:08-06-15     [name]    Offer referral reading material for Survivorship, [employer] and [facility] given  06-23-15 Appetite:Good Pain:None Arm mobility:Able to move left arm without discomfort Fatigue:None BP 125/77 mmHg  Pulse 75  Temp(Src) 97.8 F (36.6 C) (Oral)  Resp 16  Ht 5\' 4"  (1.626 m)  Wt 213 lb 6.4 oz (96.798 kg)  BMI 36.61 kg/m2  SpO2 99%

## 2015-07-31 ENCOUNTER — Ambulatory Visit: Payer: Medicare Other | Admitting: Hematology & Oncology

## 2015-07-31 ENCOUNTER — Encounter: Payer: Self-pay | Admitting: Nurse Practitioner

## 2015-07-31 ENCOUNTER — Other Ambulatory Visit: Payer: Medicare Other

## 2015-08-02 ENCOUNTER — Ambulatory Visit: Payer: Self-pay | Admitting: Orthopedic Surgery

## 2015-08-06 ENCOUNTER — Encounter: Payer: Medicare Other | Admitting: Nurse Practitioner

## 2015-08-06 ENCOUNTER — Ambulatory Visit
Admission: RE | Admit: 2015-08-06 | Discharge: 2015-08-06 | Disposition: A | Discharge status: Home / Self Care | Payer: Medicare Other | Source: Ambulatory Visit | Attending: Radiation Oncology | Admitting: Radiation Oncology

## 2015-08-06 ENCOUNTER — Encounter: Payer: Self-pay | Admitting: Radiation Oncology

## 2015-08-06 VITALS — BP 125/77 | HR 75 | Temp 97.8°F | Resp 16 | Ht 64.0 in | Wt 213.4 lb

## 2015-08-06 DIAGNOSIS — C50112 Malignant neoplasm of central portion of left female breast: Secondary | ICD-10-CM | POA: Diagnosis present

## 2015-08-06 NOTE — Addendum Note (Signed)
Encounter addended by: Malena Edman, RN on: 08/06/2015  9:54 AM<BR>     Documentation filed: Charges VN

## 2015-08-06 NOTE — Progress Notes (Signed)
   Department of Radiation Oncology  Phone:  [phone] Fax:        [phone]   Name: JAMESETTA PIETRI MRN: [id number]  DOB: 07/06/1940  Date: 08/06/2015  Follow Up Visit Note  Diagnosis: Malignant neoplasm of central portion of left female breast [facility])   Staging form: Breast, AJCC 7th Edition     Clinical stage from 09/10/2014: Stage IA (T1, N0, M0) - Unsigned     Pathologic stage from 11/20/2014: Stage IIB (T2, N1a, cM0) - Unsigned   Summary and Interval since last radiation: 1 month (04/24/15- 06/29/15)  Site/dose:  Left chest wall / 50.4 Gray @ 1.8 [name] per fraction x 28 fractions Left Supraclavicular fossa /PAB 45 Gray @1 .8 Gray per fraction x 25 fractions  Interval History: [name] presents today for routine followup.   Mrs. Kimberly [name] is here for a one month for follow up visit for breast cancer of upper-inner quadrant of left female breast. She is scheduled for a knee replacement on July 26th. She has intentionally lost weight with [employer] and has continued that. She will be unable to attend her scheduled survivorship care plan visit later today. She plans to reschedule this appointment. She has not received an appointment with Dr. Lucia Gaskins yet. She is due for a mammogram in July. She has these at the [facility] and receives a letter in the mail to schedule them.  Physical Exam:  Filed Vitals:   08/06/15 0752  BP: 125/77  Pulse: 75  Temp: 97.8 F (36.6 C)  TempSrc: Oral  Resp: 16  Height: 5\' 4"  (1.626 m)  Weight: 213 lb 6.4 oz (96.798 kg)  SpO2: 99%    Skin in treatment area is well healed with no hyperpigmentation.   IMPRESSION: Kimberly [name] is a 75 y.o. female with malignant neoplasm of central portion of left female breast and resolving acute effects of treatment.  PLAN:  She will be unable to attend her scheduled survivorship care plan visit later today. She plans to reschedule this appointment. She will continue to follow with Dr. Marin Olp with her next  appointment on 11/04/2015. She is doing well. We discussed the need for follow up every 4-6 months which she has scheduled.  We discussed the need for yearly mammograms which she can schedule with her OBGYN or with medical oncology. We discussed the need for sun protection in the treated area.  She can always call me with questions.  I will follow up with her on an as needed basis.   She is cleared for her knee surgery in July. She may return to swimming as well.      Thea Silversmith, MD  This document serves as a record of services personally performed by Thea Silversmith, MD. It was created on her behalf by Arlyce Harman, a trained medical scribe. The creation of this record is based on the scribe's personal observations and the provider's statements to them. This document has been checked and approved by the attending provider.

## 2015-08-07 ENCOUNTER — Telehealth: Payer: Self-pay | Admitting: Nurse Practitioner

## 2015-08-07 NOTE — Telephone Encounter (Signed)
Called and spoke with patient about appointment in Survivorship that was cancelled due to death in her family and need to attend funeral.  I offered to mail her a copy of her [place], which she desired.  A letter was mailed to her outlining the purpose of the content of the care plan, as well as encouraging her to reach out to me with any questions or concerns.  My business card was included in the correspondence to the patient as well.  A copy of the care plan was also routed/faxed/mailed to Rusty Aus, MD, the patient's PCP.  I will not be placing any follow-up appointments to the Survivorship Clinic for Kimberly [name], but I am happy to see her at any time in the future for any survivorship concerns that may arise. Thank you for allowing me to participate in her care!  Kenn File, [place] [phone]

## 2015-09-02 ENCOUNTER — Other Ambulatory Visit: Payer: Self-pay | Admitting: Internal Medicine

## 2015-09-02 DIAGNOSIS — Z1231 Encounter for screening mammogram for malignant neoplasm of breast: Secondary | ICD-10-CM

## 2015-09-08 ENCOUNTER — Ambulatory Visit
Admission: RE | Admit: 2015-09-08 | Discharge: 2015-09-08 | Disposition: A | Discharge status: Home / Self Care | Payer: Medicare Other | Source: Ambulatory Visit | Attending: Internal Medicine | Admitting: Internal Medicine

## 2015-09-08 DIAGNOSIS — Z1231 Encounter for screening mammogram for malignant neoplasm of breast: Secondary | ICD-10-CM

## 2015-09-09 ENCOUNTER — Encounter (HOSPITAL_COMMUNITY): Payer: Self-pay

## 2015-09-09 ENCOUNTER — Encounter (HOSPITAL_COMMUNITY)
Admission: RE | Admit: 2015-09-09 | Discharge: 2015-09-09 | Disposition: A | Discharge status: Home / Self Care | Payer: Medicare Other | Source: Ambulatory Visit | Attending: Orthopedic Surgery | Admitting: Orthopedic Surgery

## 2015-09-09 DIAGNOSIS — Z01812 Encounter for preprocedural laboratory examination: Secondary | ICD-10-CM | POA: Diagnosis not present

## 2015-09-09 HISTORY — DX: Personal history of other infectious and parasitic diseases: Z86.19

## 2015-09-09 HISTORY — DX: Unspecified disease of inner ear, unspecified ear: H83.90

## 2015-09-09 HISTORY — DX: Dizziness and giddiness: R42

## 2015-09-09 HISTORY — DX: Personal history of irradiation: Z92.3

## 2015-09-09 HISTORY — DX: Asymptomatic menopausal state: Z78.0

## 2015-09-09 HISTORY — DX: Tinnitus, unspecified ear: H93.19

## 2015-09-09 HISTORY — DX: Unspecified osteoarthritis, unspecified site: M19.90

## 2015-09-09 LAB — CBC
HEMATOCRIT: 44.6 % (ref 36.0–46.0)
HEMOGLOBIN: 14.3 g/dL (ref 12.0–15.0)
MCH: 28.1 pg (ref 26.0–34.0)
MCHC: 32.1 g/dL (ref 30.0–36.0)
MCV: 87.6 fL (ref 78.0–100.0)
Platelets: 251 10*3/uL (ref 150–400)
RBC: 5.09 MIL/uL (ref 3.87–5.11)
RDW: 15 % (ref 11.5–15.5)
WBC: 7.2 10*3/uL (ref 4.0–10.5)

## 2015-09-09 LAB — COMPREHENSIVE METABOLIC PANEL
ALBUMIN: 4.2 g/dL (ref 3.5–5.0)
ALT: 21 U/L (ref 14–54)
AST: 28 U/L (ref 15–41)
Alkaline Phosphatase: 68 U/L (ref 38–126)
Anion gap: 6 (ref 5–15)
BILIRUBIN TOTAL: 0.9 mg/dL (ref 0.3–1.2)
BUN: 26 mg/dL — AB (ref 6–20)
CO2: 28 mmol/L (ref 22–32)
Calcium: 10 mg/dL (ref 8.9–10.3)
Chloride: 108 mmol/L (ref 101–111)
Creatinine, Ser: 0.86 mg/dL (ref 0.44–1.00)
GFR calc Af Amer: >60 mL/min (ref >60)
GFR calc non Af Amer: >60 mL/min (ref >60)
GLUCOSE: 93 mg/dL (ref 65–99)
POTASSIUM: 4.6 mmol/L (ref 3.5–5.1)
SODIUM: 142 mmol/L (ref 135–145)
TOTAL PROTEIN: 7.3 g/dL (ref 6.5–8.1)

## 2015-09-09 LAB — URINE MICROSCOPIC-ADD ON

## 2015-09-09 LAB — URINALYSIS, ROUTINE W REFLEX MICROSCOPIC
BILIRUBIN URINE: NEGATIVE
Glucose, UA: NEGATIVE mg/dL
HGB URINE DIPSTICK: NEGATIVE
KETONES UR: NEGATIVE mg/dL
NITRITE: NEGATIVE
PROTEIN: NEGATIVE mg/dL
SPECIFIC GRAVITY, URINE: 1.026 (ref 1.005–1.030)
pH: 5.5 (ref 5.0–8.0)

## 2015-09-09 LAB — APTT: APTT: 33 s (ref 24–37)

## 2015-09-09 LAB — PROTIME-INR
INR: 1.02 (ref 0.00–1.49)
Prothrombin Time: 13.6 seconds (ref 11.6–15.2)

## 2015-09-09 LAB — SURGICAL PCR SCREEN
MRSA, PCR: NEGATIVE
STAPHYLOCOCCUS AUREUS: NEGATIVE

## 2015-09-09 NOTE — Progress Notes (Signed)
EKG/chart 06/30/2015  Clearance note per chart per Dr Sabra Heck 06/30/2015

## 2015-09-09 NOTE — Patient Instructions (Signed)
ABRAH KURZ  09/09/2015   Your procedure is scheduled on: Wednesday September 16, 2015  Report to [facility] Main  Entrance take [place]  elevators to 3rd floor to  [place] at 8:45 AM.  Call this number if you have problems the morning of surgery [phone]   Remember: ONLY 1 PERSON MAY GO WITH YOU TO SHORT STAY TO GET  READY MORNING OF [place].  Do not eat food or drink liquids :After Midnight.     Take these medicines the morning of surgery with A SIP OF WATER: Loratadine (Claritin); Letrozole (Femara)                                You may not have any metal on your body including hair pins and              piercings  Do not wear jewelry, make-up, lotions, powders or perfumes, deodorant             Do not wear nail polish.  Do not shave  48 hours prior to surgery.               Do not bring valuables to the hospital. [place].  Contacts, dentures or bridgework may not be worn into surgery.  Leave suitcase in the car. After surgery it may be brought to your room.                Please read over the following fact sheets you were given:MRSA INFORMATION SHEET; INCENTIVE SPIROMETER; BLOOD TRANSFUSION INFORMATION SHEET  _____________________________________________________________________             [facility] - Preparing for Surgery Before surgery, you can play an important role.  Because skin is not sterile, your skin needs to be as free of germs as possible.  You can reduce the number of germs on your skin by washing with CHG (chlorahexidine gluconate) soap before surgery.  CHG is an antiseptic cleaner which kills germs and bonds with the skin to continue killing germs even after washing. Please DO NOT use if you have an allergy to CHG or antibacterial soaps.  If your skin becomes reddened/irritated stop using the CHG and inform your nurse when you arrive at Short Stay. Do not shave (including  legs and underarms) for at least 48 hours prior to the first CHG shower.  You may shave your face/neck. Please follow these instructions carefully:  1.  Shower with CHG Soap the night before surgery and the  morning of Surgery.  2.  If you choose to wash your hair, wash your hair first as usual with your  normal  shampoo.  3.  After you shampoo, rinse your hair and body thoroughly to remove the  shampoo.                           4.  Use CHG as you would any other liquid soap.  You can apply chg directly  to the skin and wash                       Gently with a scrungie or clean washcloth.  5.  Apply the CHG Soap to your body ONLY FROM THE NECK DOWN.   Do not use on face/ open                           Wound or open sores. Avoid contact with eyes, ears mouth and genitals (private parts).                       Wash face,  Genitals (private parts) with your normal soap.             6.  Wash thoroughly, paying special attention to the area where your surgery  will be performed.  7.  Thoroughly rinse your body with warm water from the neck down.  8.  DO NOT shower/wash with your normal soap after using and rinsing off  the CHG Soap.                9.  Pat yourself dry with a clean towel.            10.  Wear clean pajamas.            11.  Place clean sheets on your bed the night of your first shower and do not  sleep with pets. Day of Surgery : Do not apply any lotions/deodorants the morning of surgery.  Please wear clean clothes to the hospital/surgery center.  FAILURE TO FOLLOW THESE INSTRUCTIONS MAY RESULT IN THE CANCELLATION OF YOUR SURGERY PATIENT SIGNATURE_________________________________  NURSE SIGNATURE__________________________________  ________________________________________________________________________   Adam Phenix  An incentive spirometer is a tool that can help keep your lungs clear and active. This tool measures how well you are filling your lungs with each breath.  Taking long deep breaths may help reverse or decrease the chance of developing breathing (pulmonary) problems (especially infection) following:  A long period of time when you are unable to move or be active. BEFORE THE PROCEDURE   If the spirometer includes an indicator to show your best effort, your nurse or respiratory therapist will set it to a desired goal.  If possible, sit up straight or lean slightly forward. Try not to slouch.  Hold the incentive spirometer in an upright position. INSTRUCTIONS FOR USE   Sit on the edge of your bed if possible, or sit up as far as you can in bed or on a chair.  Hold the incentive spirometer in an upright position.  Breathe out normally.  Place the mouthpiece in your mouth and seal your lips tightly around it.  Breathe in slowly and as deeply as possible, raising the piston or the ball toward the top of the column.  Hold your breath for 3-5 seconds or for as long as possible. Allow the piston or ball to fall to the bottom of the column.  Remove the mouthpiece from your mouth and breathe out normally.  Rest for a few seconds and repeat Steps 1 through 7 at least 10 times every 1-2 hours when you are awake. Take your time and take a few normal breaths between deep breaths.  The spirometer may include an indicator to show your best effort. Use the indicator as a goal to work toward during each repetition.  After each set of 10 deep breaths, practice coughing to be sure your lungs are clear. If you have an incision (the cut made at the time of surgery), support your incision when coughing by placing a  pillow or rolled up towels firmly against it. Once you are able to get out of bed, walk around indoors and cough well. You may stop using the incentive spirometer when instructed by your caregiver.  RISKS AND COMPLICATIONS  Take your time so you do not get dizzy or light-headed.  If you are in pain, you may need to take or ask for pain medication  before doing incentive spirometry. It is harder to take a deep breath if you are having pain. AFTER USE  Rest and breathe slowly and easily.  It can be helpful to keep track of a log of your progress. Your caregiver can provide you with a simple table to help with this. If you are using the spirometer at home, follow these instructions: [place] IF:   You are having difficultly using the spirometer.  You have trouble using the spirometer as often as instructed.  Your pain medication is not giving enough relief while using the spirometer.  You develop fever of 100.5 F (38.1 C) or higher. SEEK IMMEDIATE MEDICAL CARE IF:   You cough up bloody sputum that had not been present before.  You develop fever of 102 F (38.9 C) or greater.  You develop worsening pain at or near the incision site. MAKE SURE YOU:   Understand these instructions.  Will watch your condition.  Will get help right away if you are not doing well or get worse. Document Released: 06/20/2006 Document Revised: 05/02/2011 Document Reviewed: 08/21/2006 ExitCare Patient Information 2014 ExitCare, [place].   ________________________________________________________________________  WHAT IS A BLOOD TRANSFUSION? Blood Transfusion Information  A transfusion is the replacement of blood or some of its parts. Blood is made up of multiple cells which provide different functions.  Red blood cells carry oxygen and are used for blood loss replacement.  White blood cells fight against infection.  Platelets control bleeding.  Plasma helps clot blood.  Other blood products are available for specialized needs, such as hemophilia or other clotting disorders. BEFORE THE TRANSFUSION  Who gives blood for transfusions?   Healthy volunteers who are fully evaluated to make sure their blood is safe. This is blood bank blood. Transfusion therapy is the safest it has ever been in the practice of medicine. Before blood is  taken from a donor, a complete history is taken to make sure that person has no history of diseases nor engages in risky social behavior (examples are intravenous drug use or sexual activity with multiple partners). The donor's travel history is screened to minimize risk of transmitting infections, such as malaria. The donated blood is tested for signs of infectious diseases, such as HIV and hepatitis. The blood is then tested to be sure it is compatible with you in order to minimize the chance of a transfusion reaction. If you or a relative donates blood, this is often done in anticipation of surgery and is not appropriate for emergency situations. It takes many days to process the donated blood. RISKS AND COMPLICATIONS Although transfusion therapy is very safe and saves many lives, the main dangers of transfusion include:   Getting an infectious disease.  Developing a transfusion reaction. This is an allergic reaction to something in the blood you were given. Every precaution is taken to prevent this. The decision to have a blood transfusion has been considered carefully by your caregiver before blood is given. Blood is not given unless the benefits outweigh the risks. AFTER THE TRANSFUSION  Right after receiving a blood transfusion, you  will usually feel much better and more energetic. This is especially true if your red blood cells have gotten low (anemic). The transfusion raises the level of the red blood cells which carry oxygen, and this usually causes an energy increase.  The nurse administering the transfusion will monitor you carefully for complications. HOME CARE INSTRUCTIONS  No special instructions are needed after a transfusion. You may find your energy is better. Speak with your caregiver about any limitations on activity for underlying diseases you may have. SEEK MEDICAL CARE IF:   Your condition is not improving after your transfusion.  You develop redness or irritation at the  intravenous (IV) site. SEEK IMMEDIATE MEDICAL CARE IF:  Any of the following symptoms occur over the next 12 hours:  Shaking chills.  You have a temperature by mouth above 102 F (38.9 C), not controlled by medicine.  Chest, back, or muscle pain.  People around you feel you are not acting correctly or are confused.  Shortness of breath or difficulty breathing.  Dizziness and fainting.  You get a rash or develop hives.  You have a decrease in urine output.  Your urine turns a dark color or changes to pink, red, or brown. Any of the following symptoms occur over the next 10 days:  You have a temperature by mouth above 102 F (38.9 C), not controlled by medicine.  Shortness of breath.  Weakness after normal activity.  The white part of the eye turns yellow (jaundice).  You have a decrease in the amount of urine or are urinating less often.  Your urine turns a dark color or changes to pink, red, or brown. Document Released: 02/05/2000 Document Revised: 05/02/2011 Document Reviewed: 09/24/2007 [facility] Patient Information 2014 [place], [place].  _______________________________________________________________________

## 2015-09-09 NOTE — Progress Notes (Addendum)
CMP urinalysis and micro results in epic per PAT visit 09/09/2015 sent to Dr Wynelle Link

## 2015-09-15 ENCOUNTER — Ambulatory Visit: Payer: Self-pay | Admitting: Orthopedic Surgery

## 2015-09-15 NOTE — H&P (Signed)
[name] DOB: 03-Aug-1940 Married / Language: English / Race: White Female Date of Admission:  09/16/2015 CC:  Left Knee Pain History of Present Illness The patient is a 75 year old female who comes in for a preoperative History and Physical. The patient is scheduled for a left total knee arthroplasty to be performed by Dr. Dione Plover. [name], MD at [facility] on 09/16/2015. The patient is a 75 year old female who presents today for follow up of their knee. The patient is being followed for their left knee pain and osteoarthritis. They are now 8 week(s) out from [name] series. Symptoms reported today include: pain, aching, stiffness and difficulty arising from chair. The patient feels that they are doing poorly and report their pain level to be moderate to severe (worse when she tries to get up after sitting for a while). The following medication has been used for pain control: antiinflammatory medication (aleve) and Tylenol. The patient has not gotten any relief of their symptoms with viscosupplementation. The patient indicates that they have questions or concerns today regarding pain and their progress at this point. Unfortunately, her left knee is getting progressively worse. She is having pain at all times now including pain at rest and at night. The knee feels like it wants to give out on her. She has had cortisone and viscosupplement injections without much benefit. She is ready to get the knee fixed. They have been treated conservatively in the past for the above stated problem and despite conservative measures, they continue to have progressive pain and severe functional limitations and dysfunction. They have failed non-operative management including home exercise, medications, and injections. It is felt that they would benefit from undergoing total joint replacement. Risks and benefits of the procedure have been discussed with the patient and they elect to proceed with surgery. There  are no active contraindications to surgery such as ongoing infection or rapidly progressive neurological disease.  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 father  Social History  Pain Contract  no Number of flights of stairs before winded  less than 1 Marital status  married Tobacco use  Never smoker. never smoker Tobacco / smoke exposure  no Previously in rehab  no Living situation  live with spouse Current work status  retired Children  1 Alcohol use  never consumed alcohol Illicit drug use  no Exercise  Exercises never Drug/Alcohol Rehab (Currently)  no Post-Surgical Plans  Home following the Left TKA to be performed on 09/16/2015  Medication History  Vitamin D3 (2000UNIT Tablet, Oral) Active. Glucosamine Chondroitin Complx (Oral) Active. Zinc (50MG  Tablet, Oral) Active. Turmeric Curcumin (500MG  Capsule, Oral) Active. Letrozole (2.5MG  Tablet, Oral) Active. Aspirin EC (81MG  Tablet DR, Oral) Active. Aleve (220MG  Tablet, Oral as needed) Active. Vitamin D (Oral) Specific strength unknown - Active. Calcium 600 (Oral) Specific strength unknown - Active. Multivitamins (Oral) Specific strength unknown - Active. Claritin (Oral) Specific strength unknown -  Active. Vitamin B6 (Oral) Specific strength unknown  - Active. Vitamin C (Oral) Specific strength unknown - Active.  Past Surgical History Gallbladder Surgery  Date: 2000. laporoscopic Other Surgery  Date: 2001. KIDNEY STONE REMOVAL LEFT Tubal Ligation  Right Breast Biopsy  Date: 1997.   Review of Systems  General Present- Weight Loss (planned weight loss). Not Present- Chills, Fatigue, Fever, Memory Loss, Night Sweats and Weight Gain. Skin Not Present- Eczema, Hives, Itching, Lesions and Rash. HEENT Not Present- Dentures, Double Vision, Headache, Hearing Loss, Tinnitus and Visual Loss. Respiratory Not Present- Allergies, Chronic Cough, Coughing up blood, Shortness of breath at rest and Shortness of breath with exertion. Cardiovascular Not Present- Chest Pain, Difficulty Breathing Lying Down, Murmur, Palpitations, Racing/skipping heartbeats and Swelling. Gastrointestinal Not Present- Abdominal Pain, Bloody Stool, Constipation, Diarrhea, Difficulty Swallowing, Heartburn, Jaundice, Loss of appetitie, Nausea and Vomiting. Female Genitourinary Not Present- Blood in Urine, Discharge, Flank Pain, Incontinence, Painful Urination, Urgency, Urinary frequency, Urinary Retention, Urinating at Night and Weak urinary stream. Musculoskeletal Present- Back Pain and Morning Stiffness. Not Present- Joint Pain, Joint Swelling, Muscle Pain, Muscle Weakness and Spasms. Neurological Not Present- Blackout spells, Difficulty with balance, Dizziness, Paralysis, Tremor and Weakness. Psychiatric Not Present- Insomnia.  Vitals  Weight: 210 lb Height: 64in Weight was reported by patient. Height was reported by patient. Body Surface Area: 2 m Body Mass Index: 36.05 kg/m  Pulse: 64 (Regular)  BP: 112/78 (Sitting, Right Arm, Standard)   Physical Exam General Mental Status -Alert, cooperative and good historian. General Appearance-pleasant, Not in acute distress. Orientation-Oriented X3. Build & Nutrition-Well nourished and Well  developed.  Head and Neck Head-normocephalic, atraumatic . Neck Global Assessment - supple, no bruit auscultated on the right, no bruit auscultated on the left.  Eye Vision-Wears contact lenses. Pupil - Bilateral-Regular and Round. Motion - Bilateral-EOMI.  ENMT Note: partial upper denture plate   Chest and Lung Exam Auscultation Breath sounds - clear at anterior chest wall and clear at posterior chest wall. Adventitious sounds - No Adventitious sounds.  Cardiovascular Auscultation Rhythm - Regular rate and rhythm. Heart Sounds - S1 WNL and S2 WNL. Murmurs & Other Heart Sounds - Auscultation of the heart reveals - No Murmurs.  Abdomen Palpation/Percussion Tenderness - Abdomen is non-tender to palpation. Rigidity (guarding) - Abdomen is soft. Auscultation Auscultation of the abdomen reveals - Bowel sounds normal.  Female Genitourinary Note: Not done, not pertinent to present illness   Musculoskeletal Note: She is alert and oriented, in no apparent distress. Left knee shows slight valgus deformity. Her range about 5 to 120. There is marked crepitus on range of motion. She is tender lateral greater than medial. There is no instability. She has a significantly antalgic gait pattern. Her right knee looks great, range 0 to 125 and no tenderness or instability.  I reviewed radiographs from a few months ago. She is bone on bone in the lateral and patellofemoral compartments of that left knee with slight valgus.   Assessment & Plan Primary osteoarthritis of left knee (M17.12)  Note:Surgical Plans: Left Total Knee Replacement  Disposition: Home  PCP: Dr. Emily Filbert - Patient has been seen preoperatively and felt to be stable for surgery.  Topical TXA - Breast Cancer  Anesthesia Issues: None except Vertigo with last anesthetic.  AVOID THE LEFT ARM FOR BPS AN IVS  Signed electronically by Ok Edwards, III PA-C

## 2015-09-16 ENCOUNTER — Encounter (HOSPITAL_COMMUNITY)
Admission: RE | Disposition: A | Discharge status: Home Health | Payer: Self-pay | Source: Ambulatory Visit | Attending: Orthopedic Surgery

## 2015-09-16 ENCOUNTER — Encounter (HOSPITAL_COMMUNITY): Payer: Self-pay | Admitting: *Deleted

## 2015-09-16 ENCOUNTER — Inpatient Hospital Stay (HOSPITAL_COMMUNITY)
Admission: RE | Admit: 2015-09-16 | Discharge: 2015-09-18 | DRG: 470 | Disposition: A | Discharge status: Home Health | Payer: Medicare Other | Source: Ambulatory Visit | Attending: Orthopedic Surgery | Admitting: Orthopedic Surgery

## 2015-09-16 ENCOUNTER — Inpatient Hospital Stay (HOSPITAL_COMMUNITY): Payer: Medicare Other | Admitting: Certified Registered"

## 2015-09-16 DIAGNOSIS — Z853 Personal history of malignant neoplasm of breast: Secondary | ICD-10-CM | POA: Diagnosis not present

## 2015-09-16 DIAGNOSIS — M5136 Other intervertebral disc degeneration, lumbar region: Secondary | ICD-10-CM | POA: Diagnosis present

## 2015-09-16 DIAGNOSIS — M2100 Valgus deformity, not elsewhere classified, unspecified site: Secondary | ICD-10-CM | POA: Diagnosis present

## 2015-09-16 DIAGNOSIS — Z79899 Other long term (current) drug therapy: Secondary | ICD-10-CM

## 2015-09-16 DIAGNOSIS — G40909 Epilepsy, unspecified, not intractable, without status epilepticus: Secondary | ICD-10-CM | POA: Diagnosis present

## 2015-09-16 DIAGNOSIS — Z7982 Long term (current) use of aspirin: Secondary | ICD-10-CM

## 2015-09-16 DIAGNOSIS — Z8619 Personal history of other infectious and parasitic diseases: Secondary | ICD-10-CM | POA: Diagnosis not present

## 2015-09-16 DIAGNOSIS — M1712 Unilateral primary osteoarthritis, left knee: Principal | ICD-10-CM | POA: Diagnosis present

## 2015-09-16 DIAGNOSIS — M1710 Unilateral primary osteoarthritis, unspecified knee: Secondary | ICD-10-CM | POA: Diagnosis present

## 2015-09-16 DIAGNOSIS — M179 Osteoarthritis of knee, unspecified: Secondary | ICD-10-CM | POA: Diagnosis present

## 2015-09-16 HISTORY — PX: TOTAL KNEE ARTHROPLASTY: SHX125

## 2015-09-16 LAB — TYPE AND SCREEN
ABO/RH(D): O POS
ANTIBODY SCREEN: NEGATIVE

## 2015-09-16 SURGERY — ARTHROPLASTY, KNEE, TOTAL
Anesthesia: Spinal | Site: Knee | Laterality: Left

## 2015-09-16 MED ORDER — ACETAMINOPHEN 500 MG PO TABS
1000.0000 mg | ORAL_TABLET | Freq: Four times a day (QID) | ORAL | Status: AC
  Administered 2015-09-16 – 2015-09-17 (×4): 1000 mg via ORAL
  Filled 2015-09-16 (×3): qty 2

## 2015-09-16 MED ORDER — LACTATED RINGERS IV SOLN
INTRAVENOUS | Status: DC
  Administered 2015-09-16 (×3): via INTRAVENOUS
  Administered 2015-09-16: 1000 mL via INTRAVENOUS

## 2015-09-16 MED ORDER — PHENYLEPHRINE 40 MCG/ML (10ML) SYRINGE FOR IV PUSH (FOR BLOOD PRESSURE SUPPORT)
PREFILLED_SYRINGE | INTRAVENOUS | Status: AC
  Filled 2015-09-16: qty 10

## 2015-09-16 MED ORDER — MEPERIDINE HCL 50 MG/ML IJ SOLN: 6.2500 mg | INTRAMUSCULAR | Status: DC | PRN

## 2015-09-16 MED ORDER — DEXAMETHASONE SODIUM PHOSPHATE 10 MG/ML IJ SOLN
10.0000 mg | Freq: Once | INTRAMUSCULAR | Status: AC
  Administered 2015-09-17: 10 mg via INTRAVENOUS
  Filled 2015-09-16: qty 1

## 2015-09-16 MED ORDER — CEFAZOLIN SODIUM-DEXTROSE 2-4 GM/100ML-% IV SOLN
INTRAVENOUS | Status: AC
  Filled 2015-09-16: qty 100

## 2015-09-16 MED ORDER — LORATADINE 10 MG PO TABS
10.0000 mg | ORAL_TABLET | Freq: Every day | ORAL | Status: DC
Start: 2015-09-17 — End: 2015-09-18
  Administered 2015-09-17 – 2015-09-18 (×2): 10 mg via ORAL
  Filled 2015-09-16 (×2): qty 1

## 2015-09-16 MED ORDER — METOCLOPRAMIDE HCL 5 MG/ML IJ SOLN: 10.0000 mg | Freq: Once | INTRAMUSCULAR | Status: DC | PRN

## 2015-09-16 MED ORDER — FENTANYL CITRATE (PF) 250 MCG/5ML IJ SOLN
INTRAMUSCULAR | Status: AC
  Filled 2015-09-16: qty 5

## 2015-09-16 MED ORDER — DEXAMETHASONE SODIUM PHOSPHATE 10 MG/ML IJ SOLN
INTRAMUSCULAR | Status: AC
Start: 2015-09-16 — End: 2015-09-16
  Filled 2015-09-16: qty 1

## 2015-09-16 MED ORDER — LETROZOLE 2.5 MG PO TABS
2.5000 mg | ORAL_TABLET | Freq: Every day | ORAL | Status: DC
  Administered 2015-09-17 – 2015-09-18 (×2): 2.5 mg via ORAL
  Filled 2015-09-16 (×4): qty 1

## 2015-09-16 MED ORDER — PROPOFOL 500 MG/50ML IV EMUL
INTRAVENOUS | Status: DC | PRN
  Administered 2015-09-16: 50 ug/kg/min via INTRAVENOUS

## 2015-09-16 MED ORDER — BUPIVACAINE LIPOSOME 1.3 % IJ SUSP
INTRAMUSCULAR | Status: DC | PRN
  Administered 2015-09-16: 20 mL

## 2015-09-16 MED ORDER — SCOPOLAMINE 1 MG/3DAYS TD PT72
MEDICATED_PATCH | TRANSDERMAL | Status: AC
  Filled 2015-09-16: qty 1

## 2015-09-16 MED ORDER — POLYETHYLENE GLYCOL 3350 17 G PO PACK: 17.0000 g | PACK | Freq: Every day | ORAL | Status: DC | PRN

## 2015-09-16 MED ORDER — TRAMADOL HCL 50 MG PO TABS: 50.0000 mg | ORAL_TABLET | Freq: Four times a day (QID) | ORAL | Status: DC | PRN

## 2015-09-16 MED ORDER — ONDANSETRON HCL 4 MG/2ML IJ SOLN: 4.0000 mg | Freq: Four times a day (QID) | INTRAMUSCULAR | Status: DC | PRN

## 2015-09-16 MED ORDER — BUPIVACAINE LIPOSOME 1.3 % IJ SUSP
20.0000 mL | Freq: Once | INTRAMUSCULAR | Status: DC
  Filled 2015-09-16: qty 20

## 2015-09-16 MED ORDER — SODIUM CHLORIDE 0.9 % IJ SOLN
INTRAMUSCULAR | Status: AC
  Filled 2015-09-16: qty 50

## 2015-09-16 MED ORDER — BISACODYL 10 MG RE SUPP: 10.0000 mg | Freq: Every day | RECTAL | Status: DC | PRN

## 2015-09-16 MED ORDER — TRANEXAMIC ACID 1000 MG/10ML IV SOLN
2000.0000 mg | Freq: Once | INTRAVENOUS | Status: DC
  Filled 2015-09-16: qty 20

## 2015-09-16 MED ORDER — LIDOCAINE HCL (CARDIAC) 20 MG/ML IV SOLN
INTRAVENOUS | Status: AC
  Filled 2015-09-16: qty 5

## 2015-09-16 MED ORDER — HYDROMORPHONE HCL 2 MG PO TABS
2.0000 mg | ORAL_TABLET | ORAL | Status: DC | PRN
  Administered 2015-09-16 (×3): 2 mg via ORAL
  Administered 2015-09-17 (×4): 4 mg via ORAL
  Administered 2015-09-17: 2 mg via ORAL
  Administered 2015-09-18 (×3): 4 mg via ORAL
  Filled 2015-09-16 (×4): qty 1
  Filled 2015-09-16 (×3): qty 2
  Filled 2015-09-16: qty 1
  Filled 2015-09-16 (×3): qty 2

## 2015-09-16 MED ORDER — PHENYLEPHRINE HCL 10 MG/ML IJ SOLN
INTRAMUSCULAR | Status: DC | PRN
  Administered 2015-09-16 (×6): 80 ug via INTRAVENOUS
  Administered 2015-09-16: 40 ug via INTRAVENOUS
  Administered 2015-09-16: 80 ug via INTRAVENOUS

## 2015-09-16 MED ORDER — CEFAZOLIN SODIUM-DEXTROSE 2-4 GM/100ML-% IV SOLN
2.0000 g | Freq: Four times a day (QID) | INTRAVENOUS | Status: AC
  Administered 2015-09-16 (×2): 2 g via INTRAVENOUS
  Filled 2015-09-16 (×2): qty 100

## 2015-09-16 MED ORDER — FENTANYL CITRATE (PF) 250 MCG/5ML IJ SOLN
INTRAMUSCULAR | Status: DC | PRN
  Administered 2015-09-16: 100 ug via INTRAVENOUS

## 2015-09-16 MED ORDER — LIDOCAINE HCL (CARDIAC) 20 MG/ML IV SOLN
INTRAVENOUS | Status: DC | PRN
  Administered 2015-09-16: 50 mg via INTRAVENOUS

## 2015-09-16 MED ORDER — CEFAZOLIN SODIUM-DEXTROSE 2-4 GM/100ML-% IV SOLN
2.0000 g | INTRAVENOUS | Status: AC
  Administered 2015-09-16: 2 g via INTRAVENOUS

## 2015-09-16 MED ORDER — METHOCARBAMOL 1000 MG/10ML IJ SOLN
500.0000 mg | Freq: Four times a day (QID) | INTRAVENOUS | Status: DC | PRN
  Filled 2015-09-16: qty 5

## 2015-09-16 MED ORDER — METOCLOPRAMIDE HCL 5 MG PO TABS: 5.0000 mg | ORAL_TABLET | Freq: Three times a day (TID) | ORAL | Status: DC | PRN

## 2015-09-16 MED ORDER — METHOCARBAMOL 500 MG PO TABS: 500.0000 mg | ORAL_TABLET | Freq: Four times a day (QID) | ORAL | Status: DC | PRN

## 2015-09-16 MED ORDER — SODIUM CHLORIDE 0.9 % IV SOLN
INTRAVENOUS | Status: DC
  Administered 2015-09-17: 12:00:00 via INTRAVENOUS

## 2015-09-16 MED ORDER — MIDAZOLAM HCL 2 MG/2ML IJ SOLN
INTRAMUSCULAR | Status: AC
  Filled 2015-09-16: qty 2

## 2015-09-16 MED ORDER — MORPHINE SULFATE (PF) 2 MG/ML IV SOLN
1.0000 mg | INTRAVENOUS | Status: DC | PRN
  Administered 2015-09-17: 1 mg via INTRAVENOUS
  Filled 2015-09-16: qty 1

## 2015-09-16 MED ORDER — ACETAMINOPHEN 10 MG/ML IV SOLN
INTRAVENOUS | Status: AC
  Filled 2015-09-16: qty 100

## 2015-09-16 MED ORDER — PHENYLEPHRINE HCL 10 MG/ML IJ SOLN
INTRAMUSCULAR | Status: DC | PRN
  Administered 2015-09-16: 50 ug/min via INTRAVENOUS

## 2015-09-16 MED ORDER — PROPOFOL 10 MG/ML IV BOLUS
INTRAVENOUS | Status: AC
  Filled 2015-09-16: qty 20

## 2015-09-16 MED ORDER — BUPIVACAINE HCL 0.25 % IJ SOLN
INTRAMUSCULAR | Status: DC | PRN
  Administered 2015-09-16: 20 mL

## 2015-09-16 MED ORDER — BUPIVACAINE HCL (PF) 0.75 % IJ SOLN
INTRAMUSCULAR | Status: DC | PRN
  Administered 2015-09-16: 1.8 mL

## 2015-09-16 MED ORDER — ACETAMINOPHEN 10 MG/ML IV SOLN
1000.0000 mg | Freq: Once | INTRAVENOUS | Status: AC
  Administered 2015-09-16: 1000 mg via INTRAVENOUS

## 2015-09-16 MED ORDER — PHENYLEPHRINE HCL 10 MG/ML IJ SOLN
INTRAMUSCULAR | Status: AC
  Filled 2015-09-16: qty 1

## 2015-09-16 MED ORDER — DEXAMETHASONE SODIUM PHOSPHATE 10 MG/ML IJ SOLN
10.0000 mg | Freq: Once | INTRAMUSCULAR | Status: AC
  Administered 2015-09-16: 10 mg via INTRAVENOUS

## 2015-09-16 MED ORDER — BUPIVACAINE HCL (PF) 0.25 % IJ SOLN
INTRAMUSCULAR | Status: AC
Start: 2015-09-16 — End: 2015-09-16
  Filled 2015-09-16: qty 30

## 2015-09-16 MED ORDER — CHLORHEXIDINE GLUCONATE 4 % EX LIQD: 60.0000 mL | Freq: Once | CUTANEOUS | Status: DC

## 2015-09-16 MED ORDER — SODIUM CHLORIDE 0.9 % IR SOLN
Status: DC | PRN
  Administered 2015-09-16: 1000 mL

## 2015-09-16 MED ORDER — MENTHOL 3 MG MT LOZG
1.0000 | LOZENGE | OROMUCOSAL | Status: DC | PRN
Start: 2015-09-16 — End: 2015-09-18

## 2015-09-16 MED ORDER — ONDANSETRON HCL 4 MG/2ML IJ SOLN
INTRAMUSCULAR | Status: AC
Start: 2015-09-16 — End: 2015-09-16
  Filled 2015-09-16: qty 2

## 2015-09-16 MED ORDER — 0.9 % SODIUM CHLORIDE (POUR BTL) OPTIME
TOPICAL | Status: DC | PRN
  Administered 2015-09-16: 1000 mL

## 2015-09-16 MED ORDER — DIPHENHYDRAMINE HCL 12.5 MG/5ML PO ELIX: 12.5000 mg | ORAL_SOLUTION | ORAL | Status: DC | PRN

## 2015-09-16 MED ORDER — FENTANYL CITRATE (PF) 100 MCG/2ML IJ SOLN: 25.0000 ug | INTRAMUSCULAR | Status: DC | PRN

## 2015-09-16 MED ORDER — ONDANSETRON HCL 4 MG/2ML IJ SOLN
INTRAMUSCULAR | Status: DC | PRN
Start: 2015-09-16 — End: 2015-09-16
  Administered 2015-09-16: 4 mg via INTRAVENOUS

## 2015-09-16 MED ORDER — FLEET ENEMA 7-19 GM/118ML RE ENEM: 1.0000 | ENEMA | Freq: Once | RECTAL | Status: DC | PRN

## 2015-09-16 MED ORDER — RIVAROXABAN 10 MG PO TABS
10.0000 mg | ORAL_TABLET | Freq: Every day | ORAL | Status: DC
  Administered 2015-09-17 – 2015-09-18 (×2): 10 mg via ORAL
  Filled 2015-09-16: qty 1

## 2015-09-16 MED ORDER — PHENOL 1.4 % MT LIQD: 1.0000 | OROMUCOSAL | Status: DC | PRN

## 2015-09-16 MED ORDER — DOCUSATE SODIUM 100 MG PO CAPS
100.0000 mg | ORAL_CAPSULE | Freq: Two times a day (BID) | ORAL | Status: DC
  Administered 2015-09-16 – 2015-09-18 (×4): 100 mg via ORAL
  Filled 2015-09-16 (×4): qty 1

## 2015-09-16 MED ORDER — MIDAZOLAM HCL 2 MG/2ML IJ SOLN
INTRAMUSCULAR | Status: DC | PRN
  Administered 2015-09-16: 2 mg via INTRAVENOUS

## 2015-09-16 MED ORDER — SODIUM CHLORIDE 0.9 % IJ SOLN
INTRAMUSCULAR | Status: DC | PRN
  Administered 2015-09-16: 30 mL

## 2015-09-16 MED ORDER — METOCLOPRAMIDE HCL 5 MG/ML IJ SOLN: 5.0000 mg | Freq: Three times a day (TID) | INTRAMUSCULAR | Status: DC | PRN

## 2015-09-16 MED ORDER — SCOPOLAMINE 1 MG/3DAYS TD PT72
MEDICATED_PATCH | TRANSDERMAL | Status: DC | PRN
  Administered 2015-09-16: 1 via TRANSDERMAL

## 2015-09-16 MED ORDER — ACETAMINOPHEN 325 MG PO TABS: 650.0000 mg | ORAL_TABLET | Freq: Four times a day (QID) | ORAL | Status: DC | PRN

## 2015-09-16 MED ORDER — ONDANSETRON HCL 4 MG PO TABS: 4.0000 mg | ORAL_TABLET | Freq: Four times a day (QID) | ORAL | Status: DC | PRN

## 2015-09-16 MED ORDER — ACETAMINOPHEN 650 MG RE SUPP: 650.0000 mg | Freq: Four times a day (QID) | RECTAL | Status: DC | PRN

## 2015-09-16 SURGICAL SUPPLY — 55 items
BAG DECANTER FOR FLEXI CONT (MISCELLANEOUS) ×2 IMPLANT
BAG SPEC THK2 15X12 ZIP CLS (MISCELLANEOUS)
BAG ZIPLOCK 12X15 (MISCELLANEOUS) ×1 IMPLANT
BANDAGE ACE 6X5 VEL STRL LF (GAUZE/BANDAGES/DRESSINGS) ×2 IMPLANT
BANDAGE ELASTIC 6 VELCRO ST LF (GAUZE/BANDAGES/DRESSINGS) ×1 IMPLANT
BLADE SAG 18X100X1.27 (BLADE) ×2 IMPLANT
BLADE SAW SGTL 11.0X1.19X90.0M (BLADE) ×2 IMPLANT
BOWL SMART MIX CTS (DISPOSABLE) ×2 IMPLANT
CAP KNEE TOTAL 3 SIGMA ×1 IMPLANT
CEMENT HV SMART SET (Cement) ×4 IMPLANT
CLOTH BEACON ORANGE TIMEOUT ST (SAFETY) ×2 IMPLANT
COVER SURGICAL LIGHT HANDLE (MISCELLANEOUS) ×1 IMPLANT
CUFF TOURN SGL QUICK 34 (TOURNIQUET CUFF) ×2
CUFF TRNQT CYL 34X4X40X1 (TOURNIQUET CUFF) ×1 IMPLANT
DECANTER SPIKE VIAL GLASS SM (MISCELLANEOUS) ×2 IMPLANT
DRAPE U-SHAPE 47X51 STRL (DRAPES) ×2 IMPLANT
DRSG ADAPTIC 3X8 NADH LF (GAUZE/BANDAGES/DRESSINGS) ×2 IMPLANT
DRSG PAD ABDOMINAL 8X10 ST (GAUZE/BANDAGES/DRESSINGS) ×2 IMPLANT
DURAPREP 26ML APPLICATOR (WOUND CARE) ×2 IMPLANT
ELECT REM PT RETURN 9FT ADLT (ELECTROSURGICAL) ×2
ELECTRODE REM PT RTRN 9FT ADLT (ELECTROSURGICAL) ×1 IMPLANT
EVACUATOR 1/8 PVC DRAIN (DRAIN) ×2 IMPLANT
GAUZE SPONGE 4X4 12PLY STRL (GAUZE/BANDAGES/DRESSINGS) ×2 IMPLANT
GLOVE BIO SURGEON STRL SZ7.5 (GLOVE) ×1 IMPLANT
GLOVE BIO SURGEON STRL SZ8 (GLOVE) ×2 IMPLANT
GLOVE BIOGEL PI IND STRL 6.5 (GLOVE) IMPLANT
GLOVE BIOGEL PI IND STRL 8 (GLOVE) ×1 IMPLANT
GLOVE BIOGEL PI INDICATOR 6.5 (GLOVE)
GLOVE BIOGEL PI INDICATOR 8 (GLOVE) ×2
GLOVE SURG SS PI 6.5 STRL IVOR (GLOVE) IMPLANT
GOWN STRL REUS W/TWL LRG LVL3 (GOWN DISPOSABLE) ×2 IMPLANT
GOWN STRL REUS W/TWL XL LVL3 (GOWN DISPOSABLE) ×1 IMPLANT
HANDPIECE INTERPULSE COAX TIP (DISPOSABLE) ×2
IMMOBILIZER KNEE 20 (SOFTGOODS) ×3 IMPLANT
IMMOBILIZER KNEE 20 THIGH 36 (SOFTGOODS) ×1 IMPLANT
MANIFOLD NEPTUNE II (INSTRUMENTS) ×2 IMPLANT
NS IRRIG 1000ML POUR BTL (IV SOLUTION) ×2 IMPLANT
PACK TOTAL KNEE CUSTOM (KITS) ×2 IMPLANT
PAD ABD 8X10 STRL (GAUZE/BANDAGES/DRESSINGS) ×1 IMPLANT
PADDING CAST ABS 6INX4YD NS (CAST SUPPLIES) ×1
PADDING CAST ABS COTTON 6X4 NS (CAST SUPPLIES) IMPLANT
PADDING CAST COTTON 6X4 STRL (CAST SUPPLIES) ×6 IMPLANT
POSITIONER SURGICAL ARM (MISCELLANEOUS) ×2 IMPLANT
SET HNDPC FAN SPRY TIP SCT (DISPOSABLE) ×1 IMPLANT
STRIP CLOSURE SKIN 1/2X4 (GAUZE/BANDAGES/DRESSINGS) ×3 IMPLANT
SUT MNCRL AB 4-0 PS2 18 (SUTURE) ×2 IMPLANT
SUT VIC AB 2-0 CT1 27 (SUTURE) ×6
SUT VIC AB 2-0 CT1 TAPERPNT 27 (SUTURE) ×3 IMPLANT
SUT VLOC 180 0 24IN GS25 (SUTURE) ×2 IMPLANT
SYR 50ML LL SCALE MARK (SYRINGE) ×2 IMPLANT
TRAY FOLEY W/METER SILVER 14FR (SET/KITS/TRAYS/PACK) ×2 IMPLANT
TRAY FOLEY W/METER SILVER 16FR (SET/KITS/TRAYS/PACK) ×1 IMPLANT
WATER STERILE IRR 1500ML POUR (IV SOLUTION) ×2 IMPLANT
WRAP KNEE MAXI GEL POST OP (GAUZE/BANDAGES/DRESSINGS) ×2 IMPLANT
YANKAUER SUCT BULB TIP 10FT TU (MISCELLANEOUS) ×2 IMPLANT

## 2015-09-16 NOTE — Op Note (Signed)
Pre-operative diagnosis- Osteoarthritis Left knee(s)  Post-operative diagnosis- Osteoarthritis  Left knee(s)  Procedure-   Left Total Knee Arthroplasty  Surgeon- Dione Plover. Halie Gass, MD  Assistant- Arlee Muslim, PA-C   Anesthesia-  Spinal   EBL- * No blood loss amount entered *   Drains Hemovac x 1   Tourniquet time  Total Tourniquet Time Documented: Thigh (Left) - 38 minutes Total: Thigh (Left) - 38 minutes    Complications- None  Condition-PACU - hemodynamically stable.   Brief Clinical Note  Kimberly [name] is a 75 y.o. year old female with end stage OA of her left knee with progressively worsening pain and dysfunction. She has constant pain, with activity and at rest and significant functional deficits with difficulties even with ADLs. She has had extensive non-op management including analgesics, injections of cortisone and viscosupplements, and home exercise program, but remains in significant pain with significant dysfunction. Radiographs show bone on bone arthritis lateral and patellofemoral with valgus deformity. She presents now for left Total Knee Arthroplasty.    Procedure in detail---       The patient is brought into the operating room and positioned supine on the operating table. After successful administration of Spinal anesthetic, a tourniquet is placed high on the Left thigh(s) and the lower extremity is prepped and draped in the usual sterile fashion. Time out is performed by the operating team and then the Left  lower extremity is wrapped in Esmarch, knee flexed and the tourniquet inflated to 300 mmHg.       A midline incision is made with a ten blade through the subcutaneous tissue to the level of the extensor mechanism. A fresh blade is used to make a lateral parapatellar arthrotomy due to the patients' valgus deformity. Soft tissue over the proximal lateral tibia is subperiosteally elevated to the joint line with a knife to the posterolateral corner but not  including the structures of the posterolateral corner. Soft tissue over the proximal medial tibia is elevated with attention being paid to avoiding the patellar tendon on the tibial tubercle. The patella is everted medially, knee flexed 90 degrees and the ACL and PCL are removed. Findings are bone on bone lateral and patellofemoral with large global osteophytes. .       The drill is used to create a starting hole in the distal femur and the canal is thoroughly irrigated with sterile saline to remove the fatty contents. The 5 degree Left  valgus alignment guide is placed into the femoral canal and the distal femoral cutting block is pinned to remove 10  mm off the distal femur. Resection is made with an oscillating saw.      The tibia is subluxed forward and the menisci are removed. The extramedullary alignment guide is placed referencing proximally at the medial aspect of the tibial tubercle and distally along the second metatarsal axis and tibial crest. The block is pinned to remove 25mm off the more deficient lateral side. Resection is made with an oscillating saw. Size 4  is the most appropriate size for the tibia and the proximal tibia is prepared with the modular drill and keel punch for that size.      The femoral sizing guide is placed and size 4  is most appropriate. Rotation is marked off the epicondylar axis and confirmed by creating a rectangular flexion gap at 90 degrees. The size 4  cutting block is pinned in this rotation and the anterior, posterior and chamfer cuts are made with the  oscillating saw. The intercondylar block is then placed and that cut is made.      Trial size 4  tibial component, trial size 4 Narrow posterior stabilized femur and a 15  mm posterior stabilized rotating platform insert trial is placed. Full extension is achieved with excellent varus/valgus and   anterior/posterior balance throughout full range of motion. The patella is everted and thickness measured to be 22  mm. Free  hand resection is taken to 12 mm, a 38 template is placed, lug holes are drilled, trial patella is placed, and it tracks normally. Osteophytes are removed off the posterior femur with the trial in place. All trials are removed and the cut bone surfaces prepared with pulsatile lavage. Cement is mixed and once ready for implantation, the size 4  tibial implant, size 4 narrow posterior stabilized femoral component, and the size 38  patella are cemented in place and the patella is held with the clamp. The trial insert is placed and the knee held in full extension. The Exparel (20 ml mixed with 30 ml saline) and then 20 ml of .25% Bupivicaine is injected into the extensor mechanism, posterior capsule, medial and lateral gutters and subcutaneous tissues. All extruded cement is removed and once the cement is hard the permanent 15  mm posterior stabilized rotating platform insert is placed into the tibial tray.      The wound is copiously irrigated with saline solution and the tourniquet is released for a total   tourniquet time of 37  minutes. Bleeding is identified and controlled with electrocautery. The extensor mechanism is closed with interrupted #1 V-loc leaving open a small area from the superior to inferior pole of the patella to serve as a mini lateral release. Flexion against gravity is 140  degrees and the patella tracks normally. Subcutaneous tissue is closed with 2.0 vicryl and subcuticular with running 4.0 Monocryl.The incision is cleaned and dried and steri-strips and a bulky sterile dressing are applied. The limb is placed into a knee immobilizer and the patient is awakened and transported to recovery in stable condition.      Please note that a surgical assistant was a medical necessity for this procedure in order to perform it in a safe and expeditious manner. Surgical assistant was necessary to retract the ligaments and vital neurovascular structures to prevent injury to them and also necessary for  proper positioning of the limb to allow for anatomic placement of the prosthesis.    Dione Plover Prospero Mahnke, MD    09/16/2015, 1:13 PM

## 2015-09-16 NOTE — Transfer of Care (Signed)
Immediate Anesthesia Transfer of Care Note  Patient: Kimberly [name]  Procedure(s) Performed: Procedure(s): LEFT TOTAL KNEE ARTHROPLASTY (Left)  Patient Location: PACU  Anesthesia Type:Regional  Level of Consciousness: awake, alert  and oriented  Airway & Oxygen Therapy: Patient Spontanous Breathing and Patient connected to face mask oxygen  Post-op Assessment: Report given to RN and Post -op Vital signs reviewed and stable  Post vital signs: Reviewed and stable  Last Vitals:  Vitals:   09/16/15 0919  BP: 126/85  Pulse: 80  Resp: 16  Temp: 36.6 C    Last Pain:  Vitals:   09/16/15 0919  TempSrc: Oral      Patients Stated Pain Goal: 4 ([id number])  Complications: No apparent anesthesia complications

## 2015-09-16 NOTE — Interval H&P Note (Signed)
History and Physical Interval Note:  09/16/2015 11:49 AM  Kimberly [name]  has presented today for surgery, with the diagnosis of LEFT KNEE OA  The various methods of treatment have been discussed with the patient and family. After consideration of risks, benefits and other options for treatment, the patient has consented to  Procedure(s): LEFT TOTAL KNEE ARTHROPLASTY (Left) as a surgical intervention .  The patient's history has been reviewed, patient examined, no change in status, stable for surgery.  I have reviewed the patient's chart and labs.  Questions were answered to the patient's satisfaction.     Gearlean Alf

## 2015-09-16 NOTE — H&P (View-Only) (Signed)
Kimberly [name] DOB: 11/23/1940 Married / Language: English / Race: White Female Date of Admission:  09/16/2015 CC:  Left Knee Pain History of Present Illness The patient is a 75 year old female who comes in for a preoperative History and Physical. The patient is scheduled for a left total knee arthroplasty to be performed by Dr. Dione Plover. [name], MD at [facility] on 09/16/2015. The patient is a 75 year old female who presents today for follow up of their knee. The patient is being followed for their left knee pain and osteoarthritis. They are now 8 week(s) out from [name] series. Symptoms reported today include: pain, aching, stiffness and difficulty arising from chair. The patient feels that they are doing poorly and report their pain level to be moderate to severe (worse when she tries to get up after sitting for a while). The following medication has been used for pain control: antiinflammatory medication (aleve) and Tylenol. The patient has not gotten any relief of their symptoms with viscosupplementation. The patient indicates that they have questions or concerns today regarding pain and their progress at this point. Unfortunately, her left knee is getting progressively worse. She is having pain at all times now including pain at rest and at night. The knee feels like it wants to give out on her. She has had cortisone and viscosupplement injections without much benefit. She is ready to get the knee fixed. They have been treated conservatively in the past for the above stated problem and despite conservative measures, they continue to have progressive pain and severe functional limitations and dysfunction. They have failed non-operative management including home exercise, medications, and injections. It is felt that they would benefit from undergoing total joint replacement. Risks and benefits of the procedure have been discussed with the patient and they elect to proceed with surgery. There  are no active contraindications to surgery such as ongoing infection or rapidly progressive neurological disease.  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 father  Social History  Pain Contract  no Number of flights of stairs before winded  less than 1 Marital status  married Tobacco use  Never smoker. never smoker Tobacco / smoke exposure  no Previously in rehab  no Living situation  live with spouse Current work status  retired Children  1 Alcohol use  never consumed alcohol Illicit drug use  no Exercise  Exercises never Drug/Alcohol Rehab (Currently)  no Post-Surgical Plans  Home following the Left TKA to be performed on 09/16/2015  Medication History  Vitamin D3 (2000UNIT Tablet, Oral) Active. Glucosamine Chondroitin Complx (Oral) Active. Zinc (50MG  Tablet, Oral) Active. Turmeric Curcumin (500MG  Capsule, Oral) Active. Letrozole (2.5MG  Tablet, Oral) Active. Aspirin EC (81MG  Tablet DR, Oral) Active. Aleve (220MG  Tablet, Oral as needed) Active. Vitamin D (Oral) Specific strength unknown - Active. Calcium 600 (Oral) Specific strength unknown - Active. Multivitamins (Oral) Specific strength unknown - Active. Claritin (Oral) Specific strength unknown -  Active. Vitamin B6 (Oral) Specific strength unknown  - Active. Vitamin C (Oral) Specific strength unknown - Active.  Past Surgical History Gallbladder Surgery  Date: 2000. laporoscopic Other Surgery  Date: 2001. KIDNEY STONE REMOVAL LEFT Tubal Ligation  Right Breast Biopsy  Date: 1997.   Review of Systems  General Present- Weight Loss (planned weight loss). Not Present- Chills, Fatigue, Fever, Memory Loss, Night Sweats and Weight Gain. Skin Not Present- Eczema, Hives, Itching, Lesions and Rash. HEENT Not Present- Dentures, Double Vision, Headache, Hearing Loss, Tinnitus and Visual Loss. Respiratory Not Present- Allergies, Chronic Cough, Coughing up blood, Shortness of breath at rest and Shortness of breath with exertion. Cardiovascular Not Present- Chest Pain, Difficulty Breathing Lying Down, Murmur, Palpitations, Racing/skipping heartbeats and Swelling. Gastrointestinal Not Present- Abdominal Pain, Bloody Stool, Constipation, Diarrhea, Difficulty Swallowing, Heartburn, Jaundice, Loss of appetitie, Nausea and Vomiting. Female Genitourinary Not Present- Blood in Urine, Discharge, Flank Pain, Incontinence, Painful Urination, Urgency, Urinary frequency, Urinary Retention, Urinating at Night and Weak urinary stream. Musculoskeletal Present- Back Pain and Morning Stiffness. Not Present- Joint Pain, Joint Swelling, Muscle Pain, Muscle Weakness and Spasms. Neurological Not Present- Blackout spells, Difficulty with balance, Dizziness, Paralysis, Tremor and Weakness. Psychiatric Not Present- Insomnia.  Vitals  Weight: 210 lb Height: 64in Weight was reported by patient. Height was reported by patient. Body Surface Area: 2 m Body Mass Index: 36.05 kg/m  Pulse: 64 (Regular)  BP: 112/78 (Sitting, Right Arm, Standard)   Physical Exam General Mental Status -Alert, cooperative and good historian. General Appearance-pleasant, Not in acute distress. Orientation-Oriented X3. Build & Nutrition-Well nourished and Well  developed.  Head and Neck Head-normocephalic, atraumatic . Neck Global Assessment - supple, no bruit auscultated on the right, no bruit auscultated on the left.  Eye Vision-Wears contact lenses. Pupil - Bilateral-Regular and Round. Motion - Bilateral-EOMI.  ENMT Note: partial upper denture plate   Chest and Lung Exam Auscultation Breath sounds - clear at anterior chest wall and clear at posterior chest wall. Adventitious sounds - No Adventitious sounds.  Cardiovascular Auscultation Rhythm - Regular rate and rhythm. Heart Sounds - S1 WNL and S2 WNL. Murmurs & Other Heart Sounds - Auscultation of the heart reveals - No Murmurs.  Abdomen Palpation/Percussion Tenderness - Abdomen is non-tender to palpation. Rigidity (guarding) - Abdomen is soft. Auscultation Auscultation of the abdomen reveals - Bowel sounds normal.  Female Genitourinary Note: Not done, not pertinent to present illness   Musculoskeletal Note: She is alert and oriented, in no apparent distress. Left knee shows slight valgus deformity. Her range about 5 to 120. There is marked crepitus on range of motion. She is tender lateral greater than medial. There is no instability. She has a significantly antalgic gait pattern. Her right knee looks great, range 0 to 125 and no tenderness or instability.  I reviewed radiographs from a few months ago. She is bone on bone in the lateral and patellofemoral compartments of that left knee with slight valgus.   Assessment & Plan Primary osteoarthritis of left knee (M17.12)  Note:Surgical Plans: Left Total Knee Replacement  Disposition: Home  PCP: Dr. Emily Filbert - Patient has been seen preoperatively and felt to be stable for surgery.  Topical TXA - Breast Cancer  Anesthesia Issues: None except Vertigo with last anesthetic.  AVOID THE LEFT ARM FOR BPS AN IVS  Signed electronically by Ok Edwards, III PA-C

## 2015-09-16 NOTE — Anesthesia Procedure Notes (Signed)
Spinal  Patient location during procedure: OR End time: 09/16/2015 12:05 PM Staffing Resident/CRNA: Noralyn Pick D Performed: anesthesiologist and resident/CRNA  Preanesthetic Checklist Completed: patient identified, site marked, surgical consent, pre-op evaluation, timeout performed, IV checked, risks and benefits discussed and monitors and equipment checked Spinal Block Patient position: sitting Prep: Betadine Patient monitoring: heart rate, continuous pulse ox and blood pressure Approach: midline Location: L3-4 Injection technique: single-shot Needle Needle type: Spinocan  Needle gauge: 22 G Needle length: 9 cm Assessment Sensory level: T6 Additional Notes Expiration date of kit checked and confirmed. Patient tolerated procedure well, without complications.

## 2015-09-16 NOTE — Addendum Note (Signed)
Addendum  created 09/16/15 1505 by Marijo Conception, CRNA   Anesthesia Intra Meds edited

## 2015-09-16 NOTE — Anesthesia Preprocedure Evaluation (Signed)
Anesthesia Evaluation  Patient identified by MRN, date of birth, ID band Patient awake    Reviewed: Allergy & Precautions, NPO status , Patient's Chart, lab work & pertinent test results  History of Anesthesia Complications (+) PONV and history of anesthetic complications  Airway Mallampati: III  TM Distance: >3 FB Neck ROM: Full    Dental no notable dental hx. (+) Teeth Intact   Pulmonary neg pulmonary ROS,    Pulmonary exam normal breath sounds clear to auscultation       Cardiovascular negative cardio ROS Normal cardiovascular exam Rhythm:Regular Rate:Normal     Neuro/Psych Seizures -, Well Controlled,  negative psych ROS   GI/Hepatic negative GI ROS, Neg liver ROS,   Endo/Other  Obesity  Renal/GU Renal disease  negative genitourinary   Musculoskeletal  (+) Arthritis , OA left knee   Abdominal (+) + obese,   Peds  Hematology negative hematology ROS (+)   Anesthesia Other Findings   Reproductive/Obstetrics                             Anesthesia Physical Anesthesia Plan  ASA: III  Anesthesia Plan: Spinal   Post-op Pain Management:    Induction:   Airway Management Planned: Natural Airway, Nasal Cannula and Simple Face Mask  Additional Equipment:   Intra-op Plan:   Post-operative Plan:   Informed Consent: I have reviewed the patients History and Physical, chart, labs and discussed the procedure including the risks, benefits and alternatives for the proposed anesthesia with the patient or authorized representative who has indicated his/her understanding and acceptance.   Dental advisory given  Plan Discussed with: Anesthesiologist, CRNA and Surgeon  Anesthesia Plan Comments:         Anesthesia Quick Evaluation

## 2015-09-16 NOTE — Anesthesia Postprocedure Evaluation (Signed)
Anesthesia Post Note  Patient: Kimberly [name]  Procedure(s) Performed: Procedure(s) (LRB): LEFT TOTAL KNEE ARTHROPLASTY (Left)  Patient location during evaluation: PACU Anesthesia Type: Spinal and MAC Level of consciousness: awake and alert Pain management: pain level controlled Vital Signs Assessment: post-procedure vital signs reviewed and stable Respiratory status: spontaneous breathing and respiratory function stable Cardiovascular status: blood pressure returned to baseline and stable Postop Assessment: spinal receding Anesthetic complications: no    Last Vitals:  Vitals:   09/16/15 0919 09/16/15 1400  BP: 126/85   Pulse: 80 75  Resp: 16 12  Temp: 36.6 C     Last Pain:  Vitals:   09/16/15 1400  TempSrc:   PainSc: 0-No pain                 Doryan Bahl DANIEL

## 2015-09-17 LAB — CBC
HEMATOCRIT: 36.4 % (ref 36.0–46.0)
HEMOGLOBIN: 11.6 g/dL — AB (ref 12.0–15.0)
MCH: 27.8 pg (ref 26.0–34.0)
MCHC: 31.9 g/dL (ref 30.0–36.0)
MCV: 87.3 fL (ref 78.0–100.0)
Platelets: 205 10*3/uL (ref 150–400)
RBC: 4.17 MIL/uL (ref 3.87–5.11)
RDW: 14.7 % (ref 11.5–15.5)
WBC: 11 10*3/uL — ABNORMAL HIGH (ref 4.0–10.5)

## 2015-09-17 LAB — BASIC METABOLIC PANEL
ANION GAP: 5 (ref 5–15)
BUN: 18 mg/dL (ref 6–20)
CHLORIDE: 110 mmol/L (ref 101–111)
CO2: 25 mmol/L (ref 22–32)
Calcium: 8.2 mg/dL — ABNORMAL LOW (ref 8.9–10.3)
Creatinine, Ser: 0.74 mg/dL (ref 0.44–1.00)
GFR calc non Af Amer: >60 mL/min (ref >60)
GLUCOSE: 128 mg/dL — AB (ref 65–99)
POTASSIUM: 4.7 mmol/L (ref 3.5–5.1)
Sodium: 140 mmol/L (ref 135–145)

## 2015-09-17 MED ORDER — SODIUM CHLORIDE 0.9 % IV BOLUS (SEPSIS)
250.0000 mL | Freq: Once | INTRAVENOUS | Status: AC
  Administered 2015-09-17: 250 mL via INTRAVENOUS

## 2015-09-17 NOTE — Care Management Note (Signed)
Case Management Note  Patient Details  Name: Kimberly [name] MRN: [id number] Date of Birth: 09/21/1940  Subjective/Objective:                  LEFT TOTAL KNEE ARTHROPLASTY (Left) Action/Plan: Discharge planning Expected Discharge Date:                  Expected Discharge Plan:  Home/Self Care  In-House Referral:     Discharge planning Services  CM Consult  Post Acute Care Choice:  Home Health Choice offered to:  Patient  DME Arranged:  N/A DME Agency:  NA  HH Arranged:  NA HH Agency:  NA  Status of Service:  Completed, signed off  If discussed at [place] of Stay Meetings, dates discussed:    Additional Comments: CM met with pt in room to confirm plan is for Virtual Pt; pt confirms.  Pt states she has rolling walker and 3n1 at home and denies need for additional DME.  No other CM needs were communicated. Dellie Catholic, RN 09/17/2015, 11:14 AM

## 2015-09-17 NOTE — Progress Notes (Signed)
   Subjective: 1 Day Post-Op Procedure(s) (LRB): LEFT TOTAL KNEE ARTHROPLASTY (Left) Patient reports pain as mild and moderate.   Patient seen in rounds with Dr. Wynelle Link.  Daughter, Lynelle Smoke, in room at bedside. Patient is well, but has had some minor complaints of pain in the knee, requiring pain medications We will start therapy today.  Plan is to go Home after hospital stay.  Objective: Vital signs in last 24 hours: Temp:  [97.5 F (36.4 C)-97.9 F (36.6 C)] 97.5 F (36.4 C) (07/27 0651) Pulse Rate:  [49-80] 58 (07/27 0651) Resp:  [12-16] 12 (07/27 0651) BP: (92-126)/(56-85) 101/64 (07/27 0651) SpO2:  [93 %-100 %] 96 % (07/27 0651) Weight:  [96.6 kg (213 lb)] 96.6 kg (213 lb) (07/26 0926)  Intake/Output from previous day:  Intake/Output Summary (Last 24 hours) at 09/17/15 0843 Last data filed at 09/17/15 0836  Gross per 24 hour  Intake             5480 ml  Output             3365 ml  Net             2115 ml    Intake/Output this shift: Total I/O In: 240 [P.O.:240] Out: -   Labs:  Recent Labs  09/17/15 0410  HGB 11.6*    Recent Labs  09/17/15 0410  WBC 11.0*  RBC 4.17  HCT 36.4  PLT 205    Recent Labs  09/17/15 0410  NA 140  K 4.7  CL 110  CO2 25  BUN 18  CREATININE 0.74  GLUCOSE 128*  CALCIUM 8.2*   No results for input(s): LABPT, INR in the last 72 hours.  EXAM General - Patient is Alert, Appropriate and Oriented Extremity - Neurovascular intact Sensation intact distally Dorsiflexion/Plantar flexion intact Dressing - dressing C/D/I Motor Function - intact, moving foot and toes well on exam.  Hemovac pulled without difficulty.  Past Medical History:  Diagnosis Date  . Arthritis   . Breast cancer ([place]) 09/10/14   left breast  . Complication of anesthesia    has issues with inner ear, usually gets patch behind ear and use nausea medicine to prevent inner ear dizziness  . DDD (degenerative disc disease), lumbar   . Degenerative joint  disease   . History of measles   . History of mumps   . History of radiation therapy    28 treatments  . Inner ear dysfunction   . Kidney stones   . Menopause   . PONV (postoperative nausea and vomiting)   . Seizures ([facility])    hx of one seizure in 1963--no known reason for the seizure and none since  . Seroma    left breast  . Tinnitus   . Vertigo    1 episode following surgery     Assessment/Plan: 1 Day Post-Op Procedure(s) (LRB): LEFT TOTAL KNEE ARTHROPLASTY (Left) Active Problems:   OA (osteoarthritis) of knee  Estimated body mass index is 36.56 kg/m as calculated from the following:   Height as of this encounter: 5\' 4"  (1.626 m).   Weight as of this encounter: 96.6 kg (213 lb). Advance diet Up with therapy Plan for discharge tomorrow Discharge home - NO HHPT - Virtual Therapy at Home.  DVT Prophylaxis - Xarelto Weight-Bearing as tolerated to left leg D/C O2 and Pulse OX and try on Room Air  Arlee Muslim, PA-C Orthopaedic Surgery 09/17/2015, 8:43 AM

## 2015-09-17 NOTE — Progress Notes (Signed)
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  Follow Up Recommendations  Other (comment) (Virtual PT)     Equipment Recommendations  None recommended by PT    Recommendations for Other Services OT consult     Precautions / Restrictions Precautions Precautions: Knee;Fall Required Braces or Orthoses: Knee Immobilizer - Left Knee Immobilizer - Left: Discontinue once straight leg raise with < 10 degree lag Restrictions Weight Bearing Restrictions: No Other Position/Activity Restrictions: WBAT    Mobility  Bed Mobility Overal bed mobility: Needs Assistance Bed Mobility: Supine to Sit;Sit to Supine     Supine to sit: Min assist Sit to supine: Min assist   General bed mobility comments: cues for sequence and use of R LE to self assist.  Physical assist to manage L LE  Transfers Overall transfer level: Needs assistance Equipment used: Rolling walker (2 wheeled) Transfers: Sit to/from Stand Sit to Stand: Min guard         General transfer comment: cues for UE/LE placement  Ambulation/Gait Ambulation/Gait assistance: Min guard Ambulation Distance (Feet): 140 Feet (and 15' into bathroom) Assistive device: Rolling walker (2 wheeled) Gait Pattern/deviations: Step-to pattern;Decreased step length - right;Decreased step length - left;Shuffle;Trunk flexed;Narrow base of support Gait velocity: decr Gait velocity interpretation: Below normal speed for age/gender General Gait Details: cues for posture, position from RW, sequence and to increase BOS   Stairs            Wheelchair Mobility    Modified Rankin (Stroke Patients Only)       Balance                                     Cognition Arousal/Alertness: Awake/alert Behavior During Therapy: WFL for tasks assessed/performed Overall Cognitive Status: Within Functional Limits for tasks assessed                      Exercises      General Comments        Pertinent Vitals/Pain Pain Assessment: 0-10 Pain Score: 4  Pain Location: L knee Pain Descriptors / Indicators: Aching;Sore Pain Intervention(s): Limited activity within patient's tolerance;Monitored during session;Premedicated before session;Ice applied    Home Living                      Prior Function            PT Goals (current goals can now be found in the care plan section) Acute Rehab PT Goals Patient Stated Goal: Walk without pain PT Goal Formulation: With patient Time For Goal Achievement: 09/21/15 Potential to Achieve Goals: Good Progress towards PT goals: Progressing toward goals    Frequency  7X/week    PT Plan Current plan remains appropriate    Co-evaluation             End of Session Equipment Utilized During Treatment: Gait belt;Left knee immobilizer Activity Tolerance: Patient tolerated treatment well Patient left: in bed;with call bell/phone within reach;with family/visitor present     Time: [id number] PT Time Calculation (min) (ACUTE ONLY): 26 min  Charges:  $Gait Training: 23-37 mins  G Codes:      Kimberly [name] 2015-10-07, 4:41 PM

## 2015-09-17 NOTE — Discharge Instructions (Addendum)
° °Dr. Frank [name] °Total Joint Specialist °[facility] °[address] °[place], [place] [address] °[phone] ° °TOTAL KNEE REPLACEMENT POSTOPERATIVE DIRECTIONS ° °Knee Rehabilitation, Guidelines Following Surgery  °Results after knee surgery are often greatly improved when you follow the exercise, range of motion and muscle strengthening exercises prescribed by your doctor. Safety measures are also important to protect the knee from further injury. Any time any of these exercises cause you to have increased pain or swelling in your knee joint, decrease the amount until you are comfortable again and slowly increase them. If you have problems or questions, call your caregiver or physical therapist for advice.  ° °HOME CARE INSTRUCTIONS  °Remove items at home which could result in a fall. This includes throw rugs or furniture in walking pathways.  °· ICE to the affected knee every three hours for 30 minutes at a time and then as needed for pain and swelling.  Continue to use ice on the knee for pain and swelling from surgery. You may notice swelling that will progress down to the foot and ankle.  This is normal after surgery.  Elevate the leg when you are not up walking on it.   °· Continue to use the breathing machine which will help keep your temperature down.  It is common for your temperature to cycle up and down following surgery, especially at night when you are not up moving around and exerting yourself.  The breathing machine keeps your lungs expanded and your temperature down. °· Do not place pillow under knee, focus on keeping the knee straight while resting ° °DIET °You may resume your previous home diet once your are discharged from the hospital. ° °DRESSING / WOUND CARE / SHOWERING °You may shower 3 days after surgery, but keep the wounds dry during showering.  You may use an occlusive plastic wrap (Press'n Seal for example), NO SOAKING/SUBMERGING IN THE BATHTUB.  If the  bandage gets wet, change with a clean dry gauze.  If the incision gets wet, pat the wound dry with a clean towel. °You may start showering once you are discharged home but do not submerge the incision under water. Just pat the incision dry and apply a dry gauze dressing on daily. °Change the surgical dressing daily and reapply a dry dressing each time. ° °ACTIVITY °Walk with your walker as instructed. °Use walker as long as suggested by your caregivers. °Avoid periods of inactivity such as sitting longer than an hour when not asleep. This helps prevent blood clots.  °You may resume a sexual relationship in one month or when given the OK by your doctor.  °You may return to work once you are cleared by your doctor.  °Do not drive a car for 6 weeks or until released by you surgeon.  °Do not drive while taking narcotics. ° °WEIGHT BEARING °Weight bearing as tolerated with assist device (walker, cane, etc) as directed, use it as long as suggested by your surgeon or therapist, typically at least 4-6 weeks. ° °POSTOPERATIVE CONSTIPATION PROTOCOL °Constipation - defined medically as fewer than three stools per week and severe constipation as less than one stool per week. ° °One of the most common issues patients have following surgery is constipation.  Even if you have a regular bowel pattern at home, your normal regimen is likely to be disrupted due to multiple reasons following surgery.  Combination of anesthesia, postoperative narcotics, change in appetite and fluid intake all can affect your bowels.    In order to avoid complications following surgery, here are some recommendations in order to help you during your recovery period. ° °Colace (docusate) - Pick up an over-the-counter form of Colace or another stool softener and take twice a day as long as you are requiring postoperative pain medications.  Take with a full glass of water daily.  If you experience loose stools or diarrhea, hold the colace until you stool forms  back up.  If your symptoms do not get better within 1 week or if they get worse, check with your doctor. ° °Dulcolax (bisacodyl) - Pick up over-the-counter and take as directed by the product packaging as needed to assist with the movement of your bowels.  Take with a full glass of water.  Use this product as needed if not relieved by Colace only.  ° °MiraLax (polyethylene glycol) - Pick up over-the-counter to have on hand.  MiraLax is a solution that will increase the amount of water in your bowels to assist with bowel movements.  Take as directed and can mix with a glass of water, juice, soda, coffee, or tea.  Take if you go more than two days without a movement. °Do not use MiraLax more than once per day. Call your doctor if you are still constipated or irregular after using this medication for 7 days in a row. ° °If you continue to have problems with postoperative constipation, please contact the office for further assistance and recommendations.  If you experience "the worst abdominal pain ever" or develop nausea or vomiting, please contact the office immediatly for further recommendations for treatment. ° °ITCHING ° If you experience itching with your medications, try taking only a single pain pill, or even half a pain pill at a time.  You can also use Benadryl over the counter for itching or also to help with sleep.  ° °TED HOSE STOCKINGS °Wear the elastic stockings on both legs for three weeks following surgery during the day but you may remove then at night for sleeping. ° °MEDICATIONS °See your medication summary on the “After Visit Summary” that the nursing staff will review with you prior to discharge.  You may have some home medications which will be placed on hold until you complete the course of blood thinner medication.  It is important for you to complete the blood thinner medication as prescribed by your surgeon.  Continue your approved medications as instructed at time of  discharge. ° °PRECAUTIONS °If you experience chest pain or shortness of breath - call 911 immediately for transfer to the hospital emergency department.  °If you develop a fever greater that 101 F, purulent drainage from wound, increased redness or drainage from wound, foul odor from the wound/dressing, or calf pain - CONTACT YOUR SURGEON.   °                                                °FOLLOW-UP APPOINTMENTS °Make sure you keep all of your appointments after your operation with your surgeon and caregivers. You should call the office at the above phone number and make an appointment for approximately two weeks after the date of your surgery or on the date instructed by your surgeon outlined in the "After Visit Summary". ° ° °RANGE OF MOTION AND STRENGTHENING EXERCISES  °Rehabilitation of the knee is important following a knee injury or   an operation. After just a few days of immobilization, the muscles of the thigh which control the knee become weakened and shrink (atrophy). Knee exercises are designed to build up the tone and strength of the thigh muscles and to improve knee motion. Often times heat used for twenty to thirty minutes before working out will loosen up your tissues and help with improving the range of motion but do not use heat for the first two weeks following surgery. These exercises can be done on a training (exercise) mat, on the floor, on a table or on a bed. Use what ever works the best and is most comfortable for you Knee exercises include:  °Leg Lifts - While your knee is still immobilized in a splint or cast, you can do straight leg raises. Lift the leg to 60 degrees, hold for 3 sec, and slowly lower the leg. Repeat 10-20 times 2-3 times daily. Perform this exercise against resistance later as your knee gets better.  °Quad and Hamstring Sets - Tighten up the muscle on the front of the thigh (Quad) and hold for 5-10 sec. Repeat this 10-20 times hourly. Hamstring sets are done by pushing the  foot backward against an object and holding for 5-10 sec. Repeat as with quad sets.  °· Leg Slides: Lying on your back, slowly slide your foot toward your buttocks, bending your knee up off the floor (only go as far as is comfortable). Then slowly slide your foot back down until your leg is flat on the floor again. °· Angel Wings: Lying on your back spread your legs to the side as far apart as you can without causing discomfort.  °A rehabilitation program following serious knee injuries can speed recovery and prevent re-injury in the future due to weakened muscles. Contact your doctor or a physical therapist for more information on knee rehabilitation.  ° °IF YOU ARE TRANSFERRED TO A SKILLED REHAB FACILITY °If the patient is transferred to a skilled rehab facility following release from the hospital, a list of the current medications will be sent to the facility for the patient to continue.  When discharged from the skilled rehab facility, please have the facility set up the patient's Home Health Physical Therapy prior to being released. Also, the skilled facility will be responsible for providing the patient with their medications at time of release from the facility to include their pain medication, the muscle relaxants, and their blood thinner medication. If the patient is still at the rehab facility at time of the two week follow up appointment, the skilled rehab facility will also need to assist the patient in arranging follow up appointment in our office and any transportation needs. ° °MAKE SURE YOU:  °Understand these instructions.  °Get help right away if you are not doing well or get worse.  ° ° °Pick up stool softner and laxative for home use following surgery while on pain medications. °Do not submerge incision under water. °Please use good hand washing techniques while changing dressing each day. °May shower starting three days after surgery. °Please use a clean towel to pat the incision dry following  showers. °Continue to use ice for pain and swelling after surgery. °Do not use any lotions or creams on the incision until instructed by your surgeon. ° °Take Xarelto for two and a half more weeks, then discontinue Xarelto. °Once the patient has completed the Xarelto, they may resume the 81 mg Aspirin. ° ° °Information on my medicine - XARELTO® (Rivaroxaban) ° °  This medication education was reviewed with me or my healthcare representative as part of my discharge preparation.  The pharmacist that spoke with me during my hospital stay was:  Joycelyn Schmid Hitchins, Student-PharmD  Why was Xarelto prescribed for you? Xarelto was prescribed for you to reduce the risk of blood clots forming after orthopedic surgery. The medical term for these abnormal blood clots is venous thromboembolism (VTE).  What do you need to know about xarelto ? Take your Xarelto ONCE DAILY at the same time every day. You may take it either with or without food.  If you have difficulty swallowing the tablet whole, you may crush it and mix in applesauce just prior to taking your dose.  Take Xarelto exactly as prescribed by your doctor and DO NOT stop taking Xarelto without talking to the doctor who prescribed the medication.  Stopping without other VTE prevention medication to take the place of Xarelto may increase your risk of developing a clot.  After discharge, you should have regular check-up appointments with your healthcare provider that is prescribing your Xarelto.    What do you do if you miss a dose? If you miss a dose, take it as soon as you remember on the same day then continue your regularly scheduled once daily regimen the next day. Do not take two doses of Xarelto on the same day.   Important Safety Information A possible side effect of Xarelto is bleeding. You should call your healthcare provider right away if you experience any of the following: ? Bleeding from an injury or your nose that does not  stop. ? Unusual colored urine (red or dark brown) or unusual colored stools (red or black). ? Unusual bruising for unknown reasons. ? A serious fall or if you hit your head (even if there is no bleeding).  Some medicines may interact with Xarelto and might increase your risk of bleeding while on Xarelto. To help avoid this, consult your healthcare provider or pharmacist prior to using any new prescription or non-prescription medications, including herbals, vitamins, non-steroidal anti-inflammatory drugs (NSAIDs) and supplements.  This website has more information on Xarelto: [email or web address]

## 2015-09-17 NOTE — Evaluation (Signed)
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  All education was completed.  No further OT is needed at this time  Follow Up Recommendations  Supervision/Assistance - 24 hour    Equipment Recommendations  None recommended by OT    Recommendations for Other Services       Precautions / Restrictions Precautions Precautions: Knee;Fall Required Braces or Orthoses: Knee Immobilizer - Left Knee Immobilizer - Left: Discontinue once straight leg raise with < 10 degree lag Restrictions Weight Bearing Restrictions: No Other Position/Activity Restrictions: WBAT      Mobility Bed Mobility Overal bed mobility: Needs Assistance Bed Mobility: Supine to Sit     Supine to sit: Min assist     General bed mobility comments: oob  Transfers Overall transfer level: Needs assistance Equipment used: Rolling walker (2 wheeled) Transfers: Sit to/from Stand Sit to Stand: Min guard         General transfer comment: cues for UE/LE placement    Balance                                            ADL Overall ADL's : Needs assistance/impaired     Grooming: Wash/dry hands;Supervision/safety;Standing       Lower Body Bathing: Minimal assistance;Sit to/from stand       Lower Body Dressing: Moderate assistance;Sit to/from stand   Toilet Transfer: Min guard;Ambulation;BSC;RW   Toileting- Water quality scientist and Hygiene: Min guard;Sit to/from stand         General ADL Comments: husband will assist with adls as needed.  Pt will sponge bathe initially     Vision     Perception     Praxis      Pertinent Vitals/Pain Pain Assessment: 0-10 Pain Score: 4  Pain Location: L knee Pain  Descriptors / Indicators: Aching;Sore Pain Intervention(s): Limited activity within patient's tolerance;Monitored during session;Repositioned;Ice applied     Hand Dominance Right   Extremity/Trunk Assessment Upper Extremity Assessment Upper Extremity Assessment: Overall WFL for tasks assessed      Cervical / Trunk Assessment Cervical / Trunk Assessment: Normal   Communication Communication Communication: No difficulties   Cognition Arousal/Alertness: Awake/alert Behavior During Therapy: WFL for tasks assessed/performed Overall Cognitive Status: Within Functional Limits for tasks assessed                     General Comments       Exercises Exercises: Total Joint     Shoulder Instructions      Home Living Family/patient expects to be discharged to:: Private residence Living Arrangements: Spouse/significant other Available Help at Discharge: Family Type of Home: House Home Access: Stairs to enter Technical brewer of Steps: 2 Entrance Stairs-Rails: Right Home Layout: One level     Bathroom Shower/Tub: Tub/shower unit Shower/tub characteristics: Architectural technologist: Standard     Home Equipment: Bedside commode;Toilet riser          Prior Functioning/Environment Level of Independence: Independent             OT Diagnosis: Acute pain   OT Problem List:     OT  Treatment/Interventions:      OT Goals(Current goals can be found in the care plan section) Acute Rehab OT Goals Patient Stated Goal: Walk without pain  OT Frequency:     Barriers to D/C:            Co-evaluation              End of Session    Activity Tolerance: Patient tolerated treatment well Patient left: in chair;with call bell/phone within reach;with family/visitor present   Time: [id number] OT Time Calculation (min): 21 min Charges:  OT General Charges $OT Visit: 1 Procedure OT Evaluation $OT Eval Low Complexity: 1 Procedure G-Codes:     Sem Mccaughey 09/23/15, 12:21 PM Lesle Chris, OTR/L [phone] 23-Sep-2015

## 2015-09-17 NOTE — Evaluation (Signed)
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  Pt should progress well to dc home with family assist and HHPT follow up.    Follow Up Recommendations Other (comment) ([facility] PT follow up)    Equipment Recommendations  None recommended by PT    Recommendations for Other Services OT consult     Precautions / Restrictions Precautions Precautions: Knee;Fall Required Braces or Orthoses: Knee Immobilizer - Left Knee Immobilizer - Left: Discontinue once straight leg raise with < 10 degree lag Restrictions Weight Bearing Restrictions: No Other Position/Activity Restrictions: WBAT      Mobility  Bed Mobility Overal bed mobility: Needs Assistance Bed Mobility: Supine to Sit     Supine to sit: Min assist     General bed mobility comments: cues for sequence and use of R LE to self assist  Transfers Overall transfer level: Needs assistance Equipment used: Rolling walker (2 wheeled) Transfers: Sit to/from Stand Sit to Stand: Min assist         General transfer comment: cues for LE management and use of UEs to self assist  Ambulation/Gait Ambulation/Gait assistance: Min assist Ambulation Distance (Feet): 41 Feet Assistive device: Rolling walker (2 wheeled) Gait Pattern/deviations: Step-to pattern;Shuffle;Trunk flexed;Narrow base of support Gait velocity: decr Gait velocity interpretation: Below normal speed for age/gender General Gait Details: cues for posture, position from RW, sequence and to increase BOS  Stairs            Wheelchair Mobility    Modified Rankin (Stroke Patients Only)       Balance                                              Pertinent Vitals/Pain Pain Assessment: 0-10 Pain Score: 3  Pain Location: L knee Pain Descriptors / Indicators: Aching;Sore Pain Intervention(s): Limited activity within patient's tolerance;Monitored during session;Premedicated before session;Ice applied    Home Living Family/patient expects to be discharged to:: Private residence Living Arrangements: Spouse/significant other Available Help at Discharge: Family Type of Home: House Home Access: Stairs to enter Entrance Stairs-Rails: Right Entrance Stairs-Number of Steps: 2 Home Layout: One level Home Equipment: Environmental consultant - 2 wheels      Prior Function Level of Independence: Independent               Hand Dominance   Dominant Hand: Right    Extremity/Trunk Assessment   Upper Extremity Assessment: Overall WFL for tasks assessed           Lower Extremity Assessment: LLE deficits/detail   LLE Deficits / Details: 2/5 quads with AAROM at knee -10-  90  Cervical / Trunk Assessment: Normal  Communication   Communication: No difficulties  Cognition Arousal/Alertness: Awake/alert Behavior During Therapy: WFL for tasks assessed/performed Overall Cognitive Status: Within Functional Limits for tasks assessed                      General Comments      Exercises Total Joint Exercises Ankle Circles/Pumps: AROM;Both;15 reps;Supine Quad Sets: AROM;Both;10 reps;Supine Heel Slides: AAROM;Left;15 reps;Supine Straight Leg Raises:  AAROM;Left;10 reps;Supine      Assessment/Plan    PT Assessment Patient needs continued PT services  PT Diagnosis Difficulty walking   PT Problem List Decreased strength;Decreased range of motion;Decreased activity tolerance;Decreased mobility;Decreased knowledge of use of DME;Pain  PT Treatment Interventions DME instruction;Gait training;Stair training;Functional mobility training;Therapeutic activities;Therapeutic exercise;Patient/family education   PT Goals (Current  goals can be found in the Care Plan section) Acute Rehab PT Goals Patient Stated Goal: Walk without pain PT Goal Formulation: With patient Time For Goal Achievement: 09/21/15 Potential to Achieve Goals: Good    Frequency 7X/week   Barriers to discharge        Co-evaluation               End of Session Equipment Utilized During Treatment: Gait belt;Left knee immobilizer Activity Tolerance: Patient tolerated treatment well Patient left: in chair;with call bell/phone within reach;with family/visitor present Nurse Communication: Mobility status         Time: 0825-0858 PT Time Calculation (min) (ACUTE ONLY): 33 min   Charges:   PT Evaluation $PT Eval Low Complexity: 1 Procedure PT Treatments $Therapeutic Exercise: 8-22 mins   PT G Codes:        Kimberly [name] 2015-09-19, 11:27 AM

## 2015-09-18 LAB — BASIC METABOLIC PANEL
Anion gap: 5 (ref 5–15)
BUN: 18 mg/dL (ref 6–20)
CALCIUM: 8.7 mg/dL — AB (ref 8.9–10.3)
CO2: 27 mmol/L (ref 22–32)
CREATININE: 0.86 mg/dL (ref 0.44–1.00)
Chloride: 108 mmol/L (ref 101–111)
GFR calc Af Amer: >60 mL/min (ref >60)
GFR calc non Af Amer: >60 mL/min (ref >60)
GLUCOSE: 103 mg/dL — AB (ref 65–99)
Potassium: 4.3 mmol/L (ref 3.5–5.1)
Sodium: 140 mmol/L (ref 135–145)

## 2015-09-18 LAB — CBC
HEMATOCRIT: 37.2 % (ref 36.0–46.0)
Hemoglobin: 11.9 g/dL — ABNORMAL LOW (ref 12.0–15.0)
MCH: 28.2 pg (ref 26.0–34.0)
MCHC: 32 g/dL (ref 30.0–36.0)
MCV: 88.2 fL (ref 78.0–100.0)
PLATELETS: 215 10*3/uL (ref 150–400)
RBC: 4.22 MIL/uL (ref 3.87–5.11)
RDW: 15.4 % (ref 11.5–15.5)
WBC: 13.7 10*3/uL — ABNORMAL HIGH (ref 4.0–10.5)

## 2015-09-18 MED ORDER — TRAMADOL HCL 50 MG PO TABS: 50.0000 mg | ORAL_TABLET | Freq: Four times a day (QID) | ORAL | 1 refills | Status: DC | PRN

## 2015-09-18 MED ORDER — RIVAROXABAN 10 MG PO TABS: 10.0000 mg | ORAL_TABLET | Freq: Every day | ORAL | 0 refills | Status: DC

## 2015-09-18 MED ORDER — METHOCARBAMOL 500 MG PO TABS: 500.0000 mg | ORAL_TABLET | Freq: Four times a day (QID) | ORAL | 0 refills | Status: DC | PRN

## 2015-09-18 MED ORDER — HYDROMORPHONE HCL 2 MG PO TABS: 2.0000 mg | ORAL_TABLET | ORAL | 0 refills | Status: DC | PRN

## 2015-09-18 NOTE — Discharge Summary (Signed)
Physician Discharge Summary   Patient ID: Kimberly [name] MRN: [id number] DOB/AGE: August 21, 1940 75 y.o.  Admit date: 09/16/2015 Discharge date: 09/18/2015  Primary Diagnosis:  Osteoarthritis Left knee(s)  Admission Diagnoses:  Past Medical History:  Diagnosis Date  . Arthritis   . Breast cancer ([place]) 09/10/14   left breast  . Complication of anesthesia    has issues with inner ear, usually gets patch behind ear and use nausea medicine to prevent inner ear dizziness  . DDD (degenerative disc disease), lumbar   . Degenerative joint disease   . History of measles   . History of mumps   . History of radiation therapy    28 treatments  . Inner ear dysfunction   . Kidney stones   . Menopause   . PONV (postoperative nausea and vomiting)   . Seizures (HCC)    hx of one seizure in 1963--no known reason for the seizure and none since  . Seroma    left breast  . Tinnitus   . Vertigo    1 episode following surgery    Discharge Diagnoses:   Active Problems:   OA (osteoarthritis) of knee  Estimated body mass index is 36.56 kg/m as calculated from the following:   Height as of this encounter: 5' 4"  (1.626 m).   Weight as of this encounter: 96.6 kg (213 lb).  Procedure:  Procedure(s) (LRB): LEFT TOTAL KNEE ARTHROPLASTY (Left)   Consults: None  HPI: Kimberly [name] is a 75 y.o. year old female with end stage OA of her left knee with progressively worsening pain and dysfunction. She has constant pain, with activity and at rest and significant functional deficits with difficulties even with ADLs. She has had extensive non-op management including analgesics, injections of cortisone and viscosupplements, and home exercise program, but remains in significant pain with significant dysfunction. Radiographs show bone on bone arthritis lateral and patellofemoral with valgus deformity. She presents now for left Total Knee Arthroplasty.   Laboratory Data: Admission on 09/16/2015  Component  Date Value Ref Range Status  . WBC 09/17/2015 11.0* 4.0 - 10.5 K/uL Final  . RBC 09/17/2015 4.17  3.87 - 5.11 MIL/uL Final  . Hemoglobin 09/17/2015 11.6* 12.0 - 15.0 g/dL Final  . HCT 09/17/2015 36.4  36.0 - 46.0 % Final  . MCV 09/17/2015 87.3  78.0 - 100.0 fL Final  . MCH 09/17/2015 27.8  26.0 - 34.0 pg Final  . MCHC 09/17/2015 31.9  30.0 - 36.0 g/dL Final  . RDW 09/17/2015 14.7  11.5 - 15.5 % Final  . Platelets 09/17/2015 205  150 - 400 K/uL Final  . Sodium 09/17/2015 140  135 - 145 mmol/L Final  . Potassium 09/17/2015 4.7  3.5 - 5.1 mmol/L Final  . Chloride 09/17/2015 110  101 - 111 mmol/L Final  . CO2 09/17/2015 25  22 - 32 mmol/L Final  . Glucose, Bld 09/17/2015 128* 65 - 99 mg/dL Final  . BUN 09/17/2015 18  6 - 20 mg/dL Final  . Creatinine, Ser 09/17/2015 0.74  0.44 - 1.00 mg/dL Final  . Calcium 09/17/2015 8.2* 8.9 - 10.3 mg/dL Final  . GFR calc non Af Amer 09/17/2015 >60  >60 mL/min Final  . GFR calc Af Amer 09/17/2015 >60  >60 mL/min Final   Comment: (NOTE) The eGFR has been calculated using the CKD EPI equation. This calculation has not been validated in all clinical situations. eGFR's persistently <60 mL/min signify possible Chronic Kidney Disease.   . Anion gap  09/17/2015 5  5 - 15 Final  . WBC 09/18/2015 13.7* 4.0 - 10.5 K/uL Final  . RBC 09/18/2015 4.22  3.87 - 5.11 MIL/uL Final  . Hemoglobin 09/18/2015 11.9* 12.0 - 15.0 g/dL Final  . HCT 09/18/2015 37.2  36.0 - 46.0 % Final  . MCV 09/18/2015 88.2  78.0 - 100.0 fL Final  . MCH 09/18/2015 28.2  26.0 - 34.0 pg Final  . MCHC 09/18/2015 32.0  30.0 - 36.0 g/dL Final  . RDW 09/18/2015 15.4  11.5 - 15.5 % Final  . Platelets 09/18/2015 215  150 - 400 K/uL Final  . Sodium 09/18/2015 140  135 - 145 mmol/L Final  . Potassium 09/18/2015 4.3  3.5 - 5.1 mmol/L Final  . Chloride 09/18/2015 108  101 - 111 mmol/L Final  . CO2 09/18/2015 27  22 - 32 mmol/L Final  . Glucose, Bld 09/18/2015 103* 65 - 99 mg/dL Final  . BUN 09/18/2015  18  6 - 20 mg/dL Final  . Creatinine, Ser 09/18/2015 0.86  0.44 - 1.00 mg/dL Final  . Calcium 09/18/2015 8.7* 8.9 - 10.3 mg/dL Final  . GFR calc non Af Amer 09/18/2015 >60  >60 mL/min Final  . GFR calc Af Amer 09/18/2015 >60  >60 mL/min Final   Comment: (NOTE) The eGFR has been calculated using the CKD EPI equation. This calculation has not been validated in all clinical situations. eGFR's persistently <60 mL/min signify possible Chronic Kidney Disease.   Georgiann Hahn gap 09/18/2015 5  5 - 15 Final  Hospital Outpatient Visit on 09/09/2015  Component Date Value Ref Range Status  . aPTT 09/09/2015 33  24 - 37 seconds Final  . WBC 09/09/2015 7.2  4.0 - 10.5 K/uL Final  . RBC 09/09/2015 5.09  3.87 - 5.11 MIL/uL Final  . Hemoglobin 09/09/2015 14.3  12.0 - 15.0 g/dL Final  . HCT 09/09/2015 44.6  36.0 - 46.0 % Final  . MCV 09/09/2015 87.6  78.0 - 100.0 fL Final  . MCH 09/09/2015 28.1  26.0 - 34.0 pg Final  . MCHC 09/09/2015 32.1  30.0 - 36.0 g/dL Final  . RDW 09/09/2015 15.0  11.5 - 15.5 % Final  . Platelets 09/09/2015 251  150 - 400 K/uL Final  . Sodium 09/09/2015 142  135 - 145 mmol/L Final  . Potassium 09/09/2015 4.6  3.5 - 5.1 mmol/L Final  . Chloride 09/09/2015 108  101 - 111 mmol/L Final  . CO2 09/09/2015 28  22 - 32 mmol/L Final  . Glucose, Bld 09/09/2015 93  65 - 99 mg/dL Final  . BUN 09/09/2015 26* 6 - 20 mg/dL Final  . Creatinine, Ser 09/09/2015 0.86  0.44 - 1.00 mg/dL Final  . Calcium 09/09/2015 10.0  8.9 - 10.3 mg/dL Final  . Total Protein 09/09/2015 7.3  6.5 - 8.1 g/dL Final  . Albumin 09/09/2015 4.2  3.5 - 5.0 g/dL Final  . AST 09/09/2015 28  15 - 41 U/L Final  . ALT 09/09/2015 21  14 - 54 U/L Final  . Alkaline Phosphatase 09/09/2015 68  38 - 126 U/L Final  . Total Bilirubin 09/09/2015 0.9  0.3 - 1.2 mg/dL Final  . GFR calc non Af Amer 09/09/2015 >60  >60 mL/min Final  . GFR calc Af Amer 09/09/2015 >60  >60 mL/min Final   Comment: (NOTE) The eGFR has been calculated using  the CKD EPI equation. This calculation has not been validated in all clinical situations. eGFR's persistently <60 mL/min signify possible  Chronic Kidney Disease.   . Anion gap 09/09/2015 6  5 - 15 Final  . Prothrombin Time 09/09/2015 13.6  11.6 - 15.2 seconds Final  . INR 09/09/2015 1.02  0.00 - 1.49 Final  . ABO/RH(D) 09/16/2015 O POS   Final  . Antibody Screen 09/16/2015 NEG   Final  . Sample Expiration 09/16/2015 09/19/2015   Final  . Extend sample reason 09/16/2015 NO TRANSFUSIONS OR PREGNANCY IN THE PAST 3 MONTHS   Final  . Color, Urine 09/09/2015 YELLOW  YELLOW Final  . APPearance 09/09/2015 CLOUDY* CLEAR Final  . Specific Gravity, Urine 09/09/2015 1.026  1.005 - 1.030 Final  . pH 09/09/2015 5.5  5.0 - 8.0 Final  . Glucose, UA 09/09/2015 NEGATIVE  NEGATIVE mg/dL Final  . Hgb urine dipstick 09/09/2015 NEGATIVE  NEGATIVE Final  . Bilirubin Urine 09/09/2015 NEGATIVE  NEGATIVE Final  . Ketones, ur 09/09/2015 NEGATIVE  NEGATIVE mg/dL Final  . Protein, ur 09/09/2015 NEGATIVE  NEGATIVE mg/dL Final  . Nitrite 09/09/2015 NEGATIVE  NEGATIVE Final  . Leukocytes, UA 09/09/2015 MODERATE* NEGATIVE Final  . MRSA, PCR 09/09/2015 NEGATIVE  NEGATIVE Final  . Staphylococcus aureus 09/09/2015 NEGATIVE  NEGATIVE Final   Comment:        The Xpert SA Assay (FDA approved for NASAL specimens in patients over 52 years of age), is one component of a comprehensive surveillance program.  Test performance has been validated by [facility] for patients greater than or equal to 24 year old. It is not intended to diagnose infection nor to guide or monitor treatment.   . Squamous Epithelial / LPF 09/09/2015 0-5* NONE SEEN Final  . WBC, UA 09/09/2015 TOO NUMEROUS TO COUNT  0 - 5 WBC/hpf Final  . RBC / HPF 09/09/2015 0-5  0 - 5 RBC/hpf Final  . Bacteria, UA 09/09/2015 RARE* NONE SEEN Final  Appointment on 07/29/2015  Component Date Value Ref Range Status  . WBC 07/29/2015 5.9  3.9 - 10.0 10e3/uL Final    . RBC 07/29/2015 4.94  3.70 - 5.32 10e6/uL Final  . HGB 07/29/2015 14.1  11.6 - 15.9 g/dL Final  . HCT 07/29/2015 43.3  34.8 - 46.6 % Final  . MCV 07/29/2015 88  81 - 101 fL Final  . MCH 07/29/2015 28.5  26.0 - 34.0 pg Final  . MCHC 07/29/2015 32.6  32.0 - 36.0 g/dL Final  . RDW 07/29/2015 14.8  11.1 - 15.7 % Final  . Platelets 07/29/2015 272  145 - 400 10e3/uL Final  . NEUT# 07/29/2015 3.6  1.5 - 6.5 10e3/uL Final  . LYMPH# 07/29/2015 1.0  0.9 - 3.3 10e3/uL Final  . MONO# 07/29/2015 0.7  0.1 - 0.9 10e3/uL Final  . Eosinophils Absolute 07/29/2015 0.6* 0.0 - 0.5 10e3/uL Final  . BASO# 07/29/2015 0.1  0.0 - 0.2 10e3/uL Final  . NEUT% 07/29/2015 61.0  39.6 - 80.0 % Final  . LYMPH% 07/29/2015 17.1  14.0 - 48.0 % Final  . MONO% 07/29/2015 11.3  0.0 - 13.0 % Final  . EOS% 07/29/2015 9.6* 0.0 - 7.0 % Final  . BASO% 07/29/2015 1.0  0.0 - 2.0 % Final  . Sodium 07/29/2015 140  128 - 145 mEq/L Final  . Potassium 07/29/2015 4.5  3.3 - 4.7 mEq/L Final  . Chloride 07/29/2015 103  98 - 108 mEq/L Final  . CO2 07/29/2015 28  18 - 33 mEq/L Final  . Glucose, Bld 07/29/2015 91  73 - 118 mg/dL Final  . BUN, Bld 07/29/2015  21  7 - 22 mg/dL Final  . Creat 07/29/2015 0.9  0.6 - 1.2 mg/dl Final  . Total Bilirubin 07/29/2015 0.60  0.20 - 1.60 mg/dl Final  . Alkaline Phosphatase 07/29/2015 62  26 - 84 U/L Final  . AST 07/29/2015 30  11 - 38 U/L Final  . ALT(SGPT) 07/29/2015 24  10 - 47 U/L Final  . Total Protein 07/29/2015 7.1  6.4 - 8.1 g/dL Final  . Albumin 07/29/2015 3.7  3.3 - 5.5 g/dL Final  . Calcium 07/29/2015 10.7* 8.0 - 10.3 mg/dL Final  . Vitamin D, 25-Hydroxy 07/30/2015 53.1  30.0 - 100.0 ng/mL Final   Comment: Vitamin D deficiency has been defined by the [place] practice guideline as a level of serum 25-OH vitamin D less than 20 ng/mL (1,2). The Endocrine Society went on to further define vitamin D insufficiency as a level between 21 and 29 ng/mL  (2). 1. IOM (Institute of Medicine). 2010. Dietary reference    intakes for calcium and D. [place]: The    [employer]. 2. Holick MF, Binkley Advance, Bischoff-Ferrari HA, et al.    Evaluation, treatment, and prevention of vitamin D    deficiency: an Endocrine Society clinical practice    guideline. JCEM. 2011 Jul; 96(7):1911-30.      X-Rays:Mm Screening Breast Tomo Uni R  Result Date: 09/09/2015 CLINICAL DATA:  Screening. EXAM: 2D DIGITAL SCREENING UNILATERAL RIGHT MAMMOGRAM WITH CAD AND ADJUNCT TOMO COMPARISON:  Previous exam(s). ACR Breast Density Category c: The breast tissue is heterogeneously dense, which may obscure small masses. FINDINGS: The patient has had a left mastectomy. There are no findings suspicious for malignancy. Images were processed with CAD. IMPRESSION: No mammographic evidence of malignancy. A result letter of this screening mammogram will be mailed directly to the patient. RECOMMENDATION: Screening mammogram in one year.  (Code:SM-R-53M) BI-RADS CATEGORY  1: Negative. Electronically Signed   By: Lillia Mountain M.D.   On: 09/09/2015 14:21    EKG:No orders found for this or any previous visit.   Hospital Course: Kimberly [name] is a 75 y.o. who was admitted to [facility]. They were brought to the operating room on 09/16/2015 and underwent Procedure(s): LEFT TOTAL KNEE ARTHROPLASTY.  Patient tolerated the procedure well and was later transferred to the recovery room and then to the orthopaedic floor for postoperative care.  They were given PO and IV analgesics for pain control following their surgery.  They were given 24 hours of postoperative antibiotics of  Anti-infectives    Start     Dose/Rate Route Frequency Ordered Stop   09/16/15 1830  ceFAZolin (ANCEF) IVPB 2g/100 mL premix     2 g 200 mL/hr over 30 Minutes Intravenous Every 6 hours 09/16/15 1654 09/17/15 0000   09/16/15 0902  ceFAZolin (ANCEF) IVPB 2g/100 mL premix     2 g 200 mL/hr over  30 Minutes Intravenous On call to O.R. 09/16/15 0902 09/16/15 1207     and started on DVT prophylaxis in the form of Xarelto.   PT and OT were ordered for total joint protocol.  Discharge planning consulted to help with postop disposition and equipment needs.  Patient had a good night on the evening of surgery.  They started to get up OOB with therapy on day one. Hemovac drain was pulled without difficulty.  Continued to work with therapy into day two.  Dressing was changed on day two and the incision was healing  well. Patient was seen in rounds by Dr. Wynelle Link and was ready to go home following therapy that day.  Discharge home - NO HHPT - Virtual therapy at home. Diet - Regular diet Follow up - in 2 weeks Activity - WBAT Disposition - Home Condition Upon Discharge - Good D/C Meds - See DC Summary DVT Prophylaxis - Xarelto for three weeks and then resume the home baby 81 mg Aspirin.  Discharge Instructions    Call MD / Call 911    Complete by:  As directed   If you experience chest pain or shortness of breath, CALL 911 and be transported to the hospital emergency room.  If you develope a fever above 101 F, pus (white drainage) or increased drainage or redness at the wound, or calf pain, call your surgeon's office.   Change dressing    Complete by:  As directed   Change dressing daily with sterile 4 x 4 inch gauze dressing and apply TED hose. Do not submerge the incision under water.   Constipation Prevention    Complete by:  As directed   Drink plenty of fluids.  Prune juice may be helpful.  You may use a stool softener, such as Colace (over the counter) 100 mg twice a day.  Use MiraLax (over the counter) for constipation as needed.   Diet general    Complete by:  As directed   Discharge instructions    Complete by:  As directed   Pick up stool softner and laxative for home use following surgery while on pain medications. Do not submerge incision under water. Please use good hand washing  techniques while changing dressing each day. May shower starting three days after surgery. Please use a clean towel to pat the incision dry following showers. Continue to use ice for pain and swelling after surgery. Do not use any lotions or creams on the incision until instructed by your surgeon.   Postoperative Constipation Protocol  Constipation - defined medically as fewer than three stools per week and severe constipation as less than one stool per week.  One of the most common issues patients have following surgery is constipation.  Even if you have a regular bowel pattern at home, your normal regimen is likely to be disrupted due to multiple reasons following surgery.  Combination of anesthesia, postoperative narcotics, change in appetite and fluid intake all can affect your bowels.  In order to avoid complications following surgery, here are some recommendations in order to help you during your recovery period.  Colace (docusate) - Pick up an over-the-counter form of Colace or another stool softener and take twice a day as long as you are requiring postoperative pain medications.  Take with a full glass of water daily.  If you experience loose stools or diarrhea, hold the colace until you stool forms back up.  If your symptoms do not get better within 1 week or if they get worse, check with your doctor.  Dulcolax (bisacodyl) - Pick up over-the-counter and take as directed by the product packaging as needed to assist with the movement of your bowels.  Take with a full glass of water.  Use this product as needed if not relieved by Colace only.   MiraLax (polyethylene glycol) - Pick up over-the-counter to have on hand.  MiraLax is a solution that will increase the amount of water in your bowels to assist with bowel movements.  Take as directed and can mix with a glass of water, juice,  soda, coffee, or tea.  Take if you go more than two days without a movement. Do not use MiraLax more than once  per day. Call your doctor if you are still constipated or irregular after using this medication for 7 days in a row.  If you continue to have problems with postoperative constipation, please contact the office for further assistance and recommendations.  If you experience "the worst abdominal pain ever" or develop nausea or vomiting, please contact the office immediatly for further recommendations for treatment.   Take Xarelto for two and a half more weeks, then discontinue Xarelto. Once the patient has completed the Xarelto, they may resume the 81 mg Aspirin.    Do not put a pillow under the knee. Place it under the heel.    Complete by:  As directed   Do not sit on low chairs, stoools or toilet seats, as it may be difficult to get up from low surfaces    Complete by:  As directed   Driving restrictions    Complete by:  As directed   No driving until released by the physician.   Increase activity slowly as tolerated    Complete by:  As directed   Lifting restrictions    Complete by:  As directed   No lifting until released by the physician.   Patient may shower    Complete by:  As directed   You may shower without a dressing once there is no drainage.  Do not wash over the wound.  If drainage remains, do not shower until drainage stops.   TED hose    Complete by:  As directed   Use stockings (TED hose) for 3 weeks on both leg(s).  You may remove them at night for sleeping.   Weight bearing as tolerated    Complete by:  As directed   Laterality:  left   Extremity:  Lower       Medication List    STOP taking these medications   aspirin EC 81 MG tablet   CALCIUM 600 PO   D 2000 2000 units Tabs Generic drug:  Cholecalciferol   GLUCOSAMINE 1500 COMPLEX PO   hyaluronate sodium Gel   MULTI-VITAMINS Tabs   OVER THE COUNTER MEDICATION   PROLIA 60 MG/ML Soln injection Generic drug:  denosumab   pyridOXINE 100 MG tablet Commonly known as:  VITAMIN B-6   RA NAPROXEN SODIUM 220  MG tablet Generic drug:  naproxen sodium   TURMERIC PO   vitamin C 1000 MG tablet   Zinc 50 MG Caps     TAKE these medications   acetaminophen 650 MG CR tablet Commonly known as:  TYLENOL Take 1,300 mg by mouth. Take 2 pills in the PM   HYDROmorphone 2 MG tablet Commonly known as:  DILAUDID Take 1-2 tablets (2-4 mg total) by mouth every 4 (four) hours as needed for moderate pain or severe pain.   letrozole 2.5 MG tablet Commonly known as:  FEMARA Take 1 tablet (2.5 mg total) by mouth daily. 90 day supply   loratadine 10 MG tablet Commonly known as:  CLARITIN Take 10 mg by mouth daily.   methocarbamol 500 MG tablet Commonly known as:  ROBAXIN Take 1 tablet (500 mg total) by mouth every 6 (six) hours as needed for muscle spasms.   rivaroxaban 10 MG Tabs tablet Commonly known as:  XARELTO Take 1 tablet (10 mg total) by mouth daily with breakfast. Take Xarelto for two and a  half more weeks, then discontinue Xarelto. Once the patient has completed the Xarelto, they may resume the 81 mg Aspirin.   traMADol 50 MG tablet Commonly known as:  ULTRAM Take 1-2 tablets (50-100 mg total) by mouth every 6 (six) hours as needed (mild pain).      Follow-up Information    Gearlean Alf, MD. Schedule an appointment as soon as possible for a visit on 09/30/2015.   Specialty:  Orthopedic Surgery Why:  Call office at [phone] to setup appointment with Dr. Anne Fu staff. Contact information: [address] [place] [address] [phone]           Signed: Arlee Muslim, PA-C Orthopaedic Surgery 09/18/2015, 7:47 AM

## 2015-09-18 NOTE — Progress Notes (Signed)
Subjective: 2 Days Post-Op Procedure(s) (LRB): LEFT TOTAL KNEE ARTHROPLASTY (Left) Patient reports pain as mild.   Patient seen in rounds with Dr. Wynelle Link.  Sore today. Daughter and husband in room. Questions were answered. Patient is well, but has had some minor complaints of pain in the knee, requiring pain medications Patient is ready to go home later today following therapy. Will need to get a gait speed prior to discharge today.  Objective: Vital signs in last 24 hours: Temp:  [97.6 F (36.4 C)-98.3 F (36.8 C)] 97.6 F (36.4 C) (07/28 0700) Pulse Rate:  [53-62] 60 (07/28 0700) Resp:  [12-14] 14 (07/28 0700) BP: (87-115)/(54-65) 115/60 (07/28 0700) SpO2:  [93 %-100 %] 98 % (07/28 0700)  Intake/Output from previous day:  Intake/Output Summary (Last 24 hours) at 09/18/15 0736 Last data filed at 09/18/15 0600  Gross per 24 hour  Intake             1730 ml  Output             1975 ml  Net             -245 ml    Intake/Output this shift: No intake/output data recorded.  Labs:  Recent Labs  09/17/15 0410 09/18/15 0458  HGB 11.6* 11.9*    Recent Labs  09/17/15 0410 09/18/15 0458  WBC 11.0* 13.7*  RBC 4.17 4.22  HCT 36.4 37.2  PLT 205 215    Recent Labs  09/17/15 0410 09/18/15 0458  NA 140 140  K 4.7 4.3  CL 110 108  CO2 25 27  BUN 18 18  CREATININE 0.74 0.86  GLUCOSE 128* 103*  CALCIUM 8.2* 8.7*   No results for input(s): LABPT, INR in the last 72 hours.  EXAM: General - Patient is Alert, Appropriate and Oriented Extremity - Neurovascular intact Sensation intact distally Dorsiflexion/Plantar flexion intact Incision - clean, dry, no drainage Motor Function - intact, moving foot and toes well on exam.   Assessment/Plan: 2 Days Post-Op Procedure(s) (LRB): LEFT TOTAL KNEE ARTHROPLASTY (Left) Procedure(s) (LRB): LEFT TOTAL KNEE ARTHROPLASTY (Left) Past Medical History:  Diagnosis Date  . Arthritis   . Breast cancer ([place]) 09/10/14   left  breast  . Complication of anesthesia    has issues with inner ear, usually gets patch behind ear and use nausea medicine to prevent inner ear dizziness  . DDD (degenerative disc disease), lumbar   . Degenerative joint disease   . History of measles   . History of mumps   . History of radiation therapy    28 treatments  . Inner ear dysfunction   . Kidney stones   . Menopause   . PONV (postoperative nausea and vomiting)   . Seizures ([facility])    hx of one seizure in 1963--no known reason for the seizure and none since  . Seroma    left breast  . Tinnitus   . Vertigo    1 episode following surgery    Active Problems:   OA (osteoarthritis) of knee  Estimated body mass index is 36.56 kg/m as calculated from the following:   Height as of this encounter: 5\' 4"  (1.626 m).   Weight as of this encounter: 96.6 kg (213 lb). Up with therapy Discharge home - NO HHPT - Virtual therapy at home. Diet - Regular diet Follow up - in 2 weeks Activity - WBAT Disposition - Home Condition Upon Discharge - Good D/C Meds - See DC Summary DVT Prophylaxis -  Xarelto for three weeks and then a baby aspirin for three additional weeks.  Arlee Muslim, PA-C Orthopaedic Surgery 09/18/2015, 7:36 AM

## 2015-09-18 NOTE — Progress Notes (Signed)
Physical Therapy Treatment Patient Details Name: Kimberly [name] MRN: [id number] DOB: May 03, 1940 Today's Date: 09/18/2015    History of Present Illness Pt s/p L TKR with hx of R TKR (14), breast CA with L mastectomy and vertigo    PT Comments    Pt motivated and progressing well.  Husband present and reviewed therex, stairs and don/doff KI  Follow Up Recommendations        Equipment Recommendations  None recommended by PT    Recommendations for Other Services OT consult     Precautions / Restrictions Precautions Precautions: Knee;Fall Required Braces or Orthoses: Knee Immobilizer - Left Knee Immobilizer - Left: Discontinue once straight leg raise with < 10 degree lag (pt performed IND SLR this am) Restrictions Weight Bearing Restrictions: No Other Position/Activity Restrictions: WBAT    Mobility  Bed Mobility Overal bed mobility: Needs Assistance Bed Mobility: Supine to Sit;Sit to Supine     Supine to sit: Supervision Sit to supine: Min guard   General bed mobility comments: cues for sequence and use of R LE to self assist.  Physical assist to manage L LE  Transfers Overall transfer level: Needs assistance Equipment used: Rolling walker (2 wheeled) Transfers: Sit to/from Stand Sit to Stand: Supervision         General transfer comment: cues for UE/LE placement  Ambulation/Gait Ambulation/Gait assistance: Min guard;Supervision Ambulation Distance (Feet): 140 Feet Assistive device: Rolling walker (2 wheeled) Gait Pattern/deviations: Step-to pattern;Decreased step length - right;Decreased step length - left;Shuffle;Trunk flexed Gait velocity: decr Gait velocity interpretation: Below normal speed for age/gender General Gait Details: min cues for posture, position from RW, sequence and to increase BOS   Stairs Stairs: Yes Stairs assistance: Min assist Stair Management: No rails;Forwards Number of Stairs: 1 General stair comments: cues for sequence and  foot/RW placement  Wheelchair Mobility    Modified Rankin (Stroke Patients Only)       Balance                                    Cognition Arousal/Alertness: Awake/alert Behavior During Therapy: WFL for tasks assessed/performed Overall Cognitive Status: Within Functional Limits for tasks assessed                      Exercises Total Joint Exercises Ankle Circles/Pumps: AROM;Both;15 reps;Supine Quad Sets: AROM;Both;Supine;20 reps Heel Slides: Left;Supine;AAROM;20 reps Straight Leg Raises: Left;Supine;20 reps;AAROM;AROM Goniometric ROM: AAROM L knee -10 - 105    General Comments        Pertinent Vitals/Pain Pain Assessment: 0-10 Pain Score: 3  Pain Location: L knee Pain Descriptors / Indicators: Aching;Sore Pain Intervention(s): Limited activity within patient's tolerance;Monitored during session;Premedicated before session;Ice applied    Home Living                      Prior Function            PT Goals (current goals can now be found in the care plan section) Acute Rehab PT Goals Patient Stated Goal: Walk without pain PT Goal Formulation: With patient Time For Goal Achievement: 09/21/15 Potential to Achieve Goals: Good Progress towards PT goals: Progressing toward goals    Frequency  7X/week    PT Plan Current plan remains appropriate    Co-evaluation             End of Session Equipment Utilized During Treatment: Gait belt;Left  knee immobilizer Activity Tolerance: Patient tolerated treatment well Patient left: in bed;with call bell/phone within reach;with family/visitor present     Time: 0925-1005 PT Time Calculation (min) (ACUTE ONLY): 40 min  Charges:  $Gait Training: 8-22 mins $Therapeutic Exercise: 8-22 mins $Therapeutic Activity: 8-22 mins                    G Codes:      Kimberly [name] Sep 21, 2015, 2:53 PM

## 2015-10-12 NOTE — Telephone Encounter (Signed)
error 

## 2015-11-04 ENCOUNTER — Other Ambulatory Visit (HOSPITAL_BASED_OUTPATIENT_CLINIC_OR_DEPARTMENT_OTHER): Payer: Medicare Other

## 2015-11-04 ENCOUNTER — Encounter: Payer: Self-pay | Admitting: Hematology & Oncology

## 2015-11-04 ENCOUNTER — Ambulatory Visit (HOSPITAL_BASED_OUTPATIENT_CLINIC_OR_DEPARTMENT_OTHER): Payer: Medicare Other | Admitting: Hematology & Oncology

## 2015-11-04 VITALS — BP 107/70 | HR 91 | Temp 98.5°F | Resp 16 | Ht 64.0 in | Wt 209.0 lb

## 2015-11-04 DIAGNOSIS — C50912 Malignant neoplasm of unspecified site of left female breast: Secondary | ICD-10-CM | POA: Diagnosis present

## 2015-11-04 DIAGNOSIS — T386X5A Adverse effect of antigonadotrophins, antiestrogens, antiandrogens, not elsewhere classified, initial encounter: Secondary | ICD-10-CM

## 2015-11-04 DIAGNOSIS — C779 Secondary and unspecified malignant neoplasm of lymph node, unspecified: Secondary | ICD-10-CM | POA: Diagnosis not present

## 2015-11-04 DIAGNOSIS — C50112 Malignant neoplasm of central portion of left female breast: Secondary | ICD-10-CM

## 2015-11-04 DIAGNOSIS — N951 Menopausal and female climacteric states: Secondary | ICD-10-CM

## 2015-11-04 DIAGNOSIS — M818 Other osteoporosis without current pathological fracture: Secondary | ICD-10-CM

## 2015-11-04 LAB — COMPREHENSIVE METABOLIC PANEL (CC13)
A/G RATIO: 1.3 (ref 1.2–2.2)
ALK PHOS: 73 IU/L (ref 39–117)
ALT: 22 IU/L (ref 0–32)
AST (SGOT): 27 IU/L (ref 0–40)
Albumin, Serum: 4 g/dL (ref 3.5–4.8)
BILIRUBIN TOTAL: 0.3 mg/dL (ref 0.0–1.2)
BUN / CREAT RATIO: 28 (ref 12–28)
BUN: 21 mg/dL (ref 8–27)
CHLORIDE: 104 mmol/L (ref 96–106)
Calcium, Ser: 10.3 mg/dL (ref 8.7–10.3)
Carbon Dioxide, Total: 25 mmol/L (ref 18–29)
Creatinine, Ser: 0.76 mg/dL (ref 0.57–1.00)
GFR calc Af Amer: 91 mL/min/{1.73_m2} (ref >59)
GFR calc non Af Amer: 79 mL/min/{1.73_m2} (ref >59)
GLUCOSE: 97 mg/dL (ref 65–99)
Globulin, Total: 3.1 g/dL (ref 1.5–4.5)
POTASSIUM: 4.4 mmol/L (ref 3.5–5.2)
Sodium: 138 mmol/L (ref 134–144)
Total Protein: 7.1 g/dL (ref 6.0–8.5)

## 2015-11-04 LAB — CBC WITH DIFFERENTIAL (CANCER CENTER ONLY)
BASO#: 0.1 10*3/uL (ref 0.0–0.2)
BASO%: 1 % (ref 0.0–2.0)
EOS ABS: 0.6 10*3/uL — AB (ref 0.0–0.5)
EOS%: 8.1 % — AB (ref 0.0–7.0)
HCT: 41.7 % (ref 34.8–46.6)
HGB: 13.6 g/dL (ref 11.6–15.9)
LYMPH#: 1.2 10*3/uL (ref 0.9–3.3)
LYMPH%: 18.2 % (ref 14.0–48.0)
MCH: 28 pg (ref 26.0–34.0)
MCHC: 32.6 g/dL (ref 32.0–36.0)
MCV: 86 fL (ref 81–101)
MONO#: 0.8 10*3/uL (ref 0.1–0.9)
MONO%: 11.9 % (ref 0.0–13.0)
NEUT#: 4.1 10*3/uL (ref 1.5–6.5)
NEUT%: 60.8 % (ref 39.6–80.0)
PLATELETS: 255 10*3/uL (ref 145–400)
RBC: 4.85 10*6/uL (ref 3.70–5.32)
RDW: 15.5 % (ref 11.1–15.7)
WBC: 6.8 10*3/uL (ref 3.9–10.0)

## 2015-11-04 NOTE — Progress Notes (Signed)
Hematology and Oncology Follow Up Visit  ENIS LEATHERWOOD [id number] 1941/02/07 75 y.o. 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  Oncotype score is 7  Current Therapy:    Femara 2.5 mg by mouth daily  Prolia 60 mg subcutaneous every 6 months-first dose today  Patient has 1 week left of radiation therapy     Interim History:  Ms. Chavis is back for follow-up. She really looks good. The surgery on her left knee went well. She has good range of motion of the left knee. She's completed physical therapy for this.  The left mastectomy site is healed up nicely. She's had no problems with this. She does not have a see the plastic surgeon for any follow-up.  She's had no problems with fatigue or weakness. She's had no nausea or vomiting. She's had no cough or shortness of breath.  Of note, she completed radiation therapy back in early June. Tomasita Crumble she is on Femara. She's not had any arthralgias with Femara.   Overall, her performance status is ECOG 1.   Medications:  Current Outpatient Prescriptions:  .  acetaminophen (TYLENOL) 650 MG CR tablet, Take 1,300 mg by mouth. Take 2 pills in the PM, Disp: , Rfl:  .  Ascorbic Acid (VITAMIN C) 1000 MG tablet, Take 1,000 mg by mouth daily., Disp: , Rfl:  .  Cholecalciferol (VITAMIN D) 2000 units CAPS, Take by mouth., Disp: , Rfl:  .  HYDROmorphone (DILAUDID) 2 MG tablet, Take 1-2 tablets (2-4 mg total) by mouth every 4 (four) hours as needed for moderate pain or severe pain., Disp: 80 tablet, Rfl: 0 .  letrozole (FEMARA) 2.5 MG tablet, Take 1 tablet (2.5 mg total) by mouth daily. 90 day supply, Disp: 90 tablet, Rfl: 3 .  loratadine (CLARITIN) 10 MG tablet, Take 10 mg by mouth daily., Disp: , Rfl:  .  Multiple Vitamin (MULTI-VITAMINS) TABS, Take by mouth., Disp: , Rfl:  .  naproxen sodium (ANAPROX) 220 MG tablet, Take by mouth., Disp: , Rfl:  .  Pyridoxine HCl (VITAMIN B-6 PO), Take  by mouth., Disp: , Rfl:  .  traMADol (ULTRAM) 50 MG tablet, Take 1-2 tablets (50-100 mg total) by mouth every 6 (six) hours as needed (mild pain)., Disp: 80 tablet, Rfl: 1 .  Zinc Acetate 50 MG CAPS, Take by mouth., Disp: , Rfl:   Allergies:  Allergies  Allergen Reactions  . Codeine Nausea And Vomiting    Past Medical History, Surgical history, Social history, and Family History were reviewed and updated.  Review of Systems: As above  Physical Exam:  height is 5' 4"  (1.626 m) and weight is 209 lb (94.8 kg). Her oral temperature is 98.5 F (36.9 C). Her blood pressure is 107/70 and her pulse is 91. Her respiration is 16.   Wt Readings from Last 3 Encounters:  11/04/15 209 lb (94.8 kg)  09/16/15 213 lb (96.6 kg)  09/09/15 213 lb 4 oz (96.7 kg)     Head and neck exam shows no ocular or oral lesions. She has no palpable cervical or supraclavicular lymph nodes. Lungs are clear. Cardiac exam regular rate and rhythm with no murmurs, rubs or bruits. Breast exam shows right breast with no masses, edema or erythema. There is no right axillary adenopathy. Left chest wall shows a drainage tube in. She has a healing mastectomy scar. No erythema is noted. No obvious fluid collection is noted. She has no palpable left  axillary lymph node. Abdominal exam shows no fluid wave. There is no guarding or rebound tenderness. There is no abdominal mass. Back exam shows no tenderness over the spine, ribs or hips. Extremities shows no clubbing, cyanosis or edema. She has the left knee arthroplasty scar. This is well-healed. There is minimal swelling noted. She does have a little bit of erythema just below the left knee. No lymphedema is noted in the arms. Skin exam shows no rashes, ecchymoses or petechia. Neurological exam is nonfoca  Lab Results  Component Value Date   WBC 6.8 11/04/2015   HGB 13.6 11/04/2015   HCT 41.7 11/04/2015   MCV 86 11/04/2015   PLT 255 11/04/2015     Chemistry      Component  Value Date/Time   NA 140 09/18/2015 0458   NA 140 07/29/2015 1301   NA 142 06/18/2015 1333   K 4.3 09/18/2015 0458   K 4.5 07/29/2015 1301   K 4.7 06/18/2015 1333   CL 108 09/18/2015 0458   CL 103 07/29/2015 1301   CO2 27 09/18/2015 0458   CO2 28 07/29/2015 1301   CO2 27 06/18/2015 1333   BUN 18 09/18/2015 0458   BUN 21 07/29/2015 1301   BUN 22.3 06/18/2015 1333   CREATININE 0.86 09/18/2015 0458   CREATININE 0.9 07/29/2015 1301   CREATININE 0.9 06/18/2015 1333      Component Value Date/Time   CALCIUM 8.7 (L) 09/18/2015 0458   CALCIUM 10.7 (H) 07/29/2015 1301   CALCIUM 10.4 06/18/2015 1333   ALKPHOS 68 09/09/2015 1400   ALKPHOS 62 07/29/2015 1301   ALKPHOS 70 06/18/2015 1333   AST 28 09/09/2015 1400   AST 30 07/29/2015 1301   AST 24 06/18/2015 1333   ALT 21 09/09/2015 1400   ALT 24 07/29/2015 1301   ALT 18 06/18/2015 1333   BILITOT 0.9 09/09/2015 1400   BILITOT 0.60 07/29/2015 1301   BILITOT 0.33 06/18/2015 1333         Impression and Plan: Ms. Marrone is a 75 year old postmenopausal female with stage IIb ductal carcinoma of the left breast. She had one positive lymph node. She had a very low Oncotype score so I do not think that there will be a role for chemotherapy.  From my point of view, everything really looks quite good. The surgery for her left knee went well. She is getting around better.  We will go ahead and plan to get her back to see [place] in 3 more months. At that point, she will get a dose of Prolia.   Volanda Napoleon, MD 9/13/20174:04 PM

## 2015-11-05 LAB — VITAMIN D 25 HYDROXY (VIT D DEFICIENCY, FRACTURES): VIT D 25 HYDROXY: 42.3 ng/mL (ref 30.0–100.0)

## 2015-12-31 ENCOUNTER — Encounter: Payer: Self-pay | Admitting: *Deleted

## 2015-12-31 ENCOUNTER — Telehealth: Payer: Self-pay | Admitting: *Deleted

## 2015-12-31 NOTE — Telephone Encounter (Signed)
Patient has to have tooth pulled. She's scheduled to have this done on Tuesday, November 14th. She is on prolia; her last dose was 07/29/15. She would like to know if there are any special considerations.   Spoke to Dr Marin Olp. He has no recommendations for the present, however he would like her next prolia injection to be pushed out to March. Patient aware of recommendation and message sent to scheduler to move injection.

## 2016-02-03 ENCOUNTER — Ambulatory Visit (HOSPITAL_BASED_OUTPATIENT_CLINIC_OR_DEPARTMENT_OTHER): Payer: Medicare Other | Admitting: Hematology & Oncology

## 2016-02-03 ENCOUNTER — Ambulatory Visit: Payer: Medicare Other

## 2016-02-03 ENCOUNTER — Other Ambulatory Visit (HOSPITAL_BASED_OUTPATIENT_CLINIC_OR_DEPARTMENT_OTHER): Payer: Medicare Other

## 2016-02-03 ENCOUNTER — Telehealth: Payer: Self-pay | Admitting: *Deleted

## 2016-02-03 VITALS — BP 142/84 | HR 65 | Temp 97.8°F | Resp 20 | Wt 211.4 lb

## 2016-02-03 DIAGNOSIS — C50912 Malignant neoplasm of unspecified site of left female breast: Secondary | ICD-10-CM | POA: Diagnosis present

## 2016-02-03 DIAGNOSIS — T386X5A Adverse effect of antigonadotrophins, antiestrogens, antiandrogens, not elsewhere classified, initial encounter: Secondary | ICD-10-CM

## 2016-02-03 DIAGNOSIS — C773 Secondary and unspecified malignant neoplasm of axilla and upper limb lymph nodes: Secondary | ICD-10-CM | POA: Diagnosis not present

## 2016-02-03 DIAGNOSIS — C50112 Malignant neoplasm of central portion of left female breast: Secondary | ICD-10-CM

## 2016-02-03 DIAGNOSIS — Z170 Estrogen receptor positive status [ER+]: Secondary | ICD-10-CM | POA: Diagnosis not present

## 2016-02-03 DIAGNOSIS — M818 Other osteoporosis without current pathological fracture: Secondary | ICD-10-CM

## 2016-02-03 DIAGNOSIS — N951 Menopausal and female climacteric states: Secondary | ICD-10-CM

## 2016-02-03 LAB — CBC WITH DIFFERENTIAL (CANCER CENTER ONLY)
BASO#: 0.1 10*3/uL (ref 0.0–0.2)
BASO%: 0.9 % (ref 0.0–2.0)
EOS ABS: 0.5 10*3/uL (ref 0.0–0.5)
EOS%: 8.7 % — ABNORMAL HIGH (ref 0.0–7.0)
HCT: 42.3 % (ref 34.8–46.6)
HEMOGLOBIN: 13.5 g/dL (ref 11.6–15.9)
LYMPH#: 1 10*3/uL (ref 0.9–3.3)
LYMPH%: 18.5 % (ref 14.0–48.0)
MCH: 27.7 pg (ref 26.0–34.0)
MCHC: 31.9 g/dL — ABNORMAL LOW (ref 32.0–36.0)
MCV: 87 fL (ref 81–101)
MONO#: 0.8 10*3/uL (ref 0.1–0.9)
MONO%: 14.2 % — AB (ref 0.0–13.0)
NEUT#: 3.1 10*3/uL (ref 1.5–6.5)
NEUT%: 57.7 % (ref 39.6–80.0)
PLATELETS: 228 10*3/uL (ref 145–400)
RBC: 4.87 10*6/uL (ref 3.70–5.32)
RDW: 16.4 % — ABNORMAL HIGH (ref 11.1–15.7)
WBC: 5.3 10*3/uL (ref 3.9–10.0)

## 2016-02-03 LAB — COMPREHENSIVE METABOLIC PANEL
ALT: 27 U/L (ref 0–55)
ANION GAP: 10 meq/L (ref 3–11)
AST: 32 U/L (ref 5–34)
Albumin: 3.6 g/dL (ref 3.5–5.0)
Alkaline Phosphatase: 99 U/L (ref 40–150)
BILIRUBIN TOTAL: 0.36 mg/dL (ref 0.20–1.20)
BUN: 21.9 mg/dL (ref 7.0–26.0)
CALCIUM: 10 mg/dL (ref 8.4–10.4)
CHLORIDE: 106 meq/L (ref 98–109)
CO2: 26 meq/L (ref 22–29)
CREATININE: 0.9 mg/dL (ref 0.6–1.1)
EGFR: 67 mL/min/{1.73_m2} — ABNORMAL LOW (ref >90)
Glucose: 90 mg/dl (ref 70–140)
Potassium: 4 mEq/L (ref 3.5–5.1)
SODIUM: 141 meq/L (ref 136–145)
TOTAL PROTEIN: 7 g/dL (ref 6.4–8.3)

## 2016-02-03 NOTE — Telephone Encounter (Addendum)
Patient aware of results  ----- Message from Volanda Napoleon, MD sent at 02/03/2016  2:03 PM EST ----- Call - the chemistry labs look great!!!  pete

## 2016-02-03 NOTE — Progress Notes (Signed)
Hematology and Oncology Follow Up Visit  Kimberly [name] [id number] Feb 14, 1941 75 y.o. 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  Oncotype score is 7  Current Therapy:    Femara 2.5 mg by mouth daily  Prolia 60 mg subcutaneous every 6 months - next dose in April 2018  Patient has 1 week left of radiation therapy     Interim History:  Kimberly [name] is back for follow-up. She really looks good. The surgery on her left knee went well. She had this back in July. She really got to this nicely. She has good range of motion of the left knee. She's completed physical therapy for this.  The left mastectomy site is healed up nicely. She's had no problems with this. She does not have a see the plastic surgeon for any follow-up.  She's had no problems with fatigue or weakness. She's had no nausea or vomiting. She's had no cough or shortness of breath.  Of note, she completed radiation therapy back in early June.  She is on Femara. She's not had any arthralgias with Femara. We will go ahead and give her Prolia in March or April.  Overall, her performance status is ECOG 1.   Medications:  Current Outpatient Prescriptions:  .  acetaminophen (TYLENOL) 650 MG CR tablet, Take 1,300 mg by mouth. Take 2 pills in the PM, Disp: , Rfl:  .  amoxicillin (AMOXIL) 500 MG capsule, 2,000 mg. , Disp: , Rfl:  .  Ascorbic Acid (VITAMIN C) 1000 MG tablet, Take 1,000 mg by mouth daily., Disp: , Rfl:  .  aspirin EC 81 MG tablet, Take 81 mg by mouth daily., Disp: , Rfl:  .  calcium carbonate (CALCIUM 600) 600 MG TABS tablet, Take 600 mg by mouth 2 (two) times daily with a meal., Disp: , Rfl:  .  Cholecalciferol (VITAMIN D) 2000 units CAPS, Take by mouth., Disp: , Rfl:  .  Glucosamine-Chondroit-Vit C-Mn (GLUCOSAMINE CHONDR 500 COMPLEX PO), Take by mouth., Disp: , Rfl:  .  letrozole (FEMARA) 2.5 MG tablet, Take 1 tablet (2.5 mg total) by mouth daily. 90  day supply, Disp: 90 tablet, Rfl: 3 .  loratadine (CLARITIN) 10 MG tablet, Take 10 mg by mouth daily., Disp: , Rfl:  .  Multiple Vitamin (MULTI-VITAMINS) TABS, Take by mouth., Disp: , Rfl:  .  Pyridoxine HCl (VITAMIN B-6 PO), Take by mouth., Disp: , Rfl:  .  Turmeric 500 MG TABS, Take by mouth., Disp: , Rfl:  .  Zinc Acetate 50 MG CAPS, Take by mouth., Disp: , Rfl:  .  meclizine (ANTIVERT) 25 MG tablet, Take by mouth., Disp: , Rfl:   Allergies:  Allergies  Allergen Reactions  . Codeine Nausea And Vomiting    Past Medical History, Surgical history, Social history, and Family History were reviewed and updated.  Review of Systems: As above  Physical Exam:  weight is 211 lb 6.4 oz (95.9 kg). Her oral temperature is 97.8 F (36.6 C). Her blood pressure is 142/84 (abnormal) and her pulse is 65. Her respiration is 20.   Wt Readings from Last 3 Encounters:  02/03/16 211 lb 6.4 oz (95.9 kg)  11/04/15 209 lb (94.8 kg)  09/16/15 213 lb (96.6 kg)     Head and neck exam shows no ocular or oral lesions. She has no palpable cervical or supraclavicular lymph nodes. Lungs are clear. Cardiac exam regular rate and rhythm with no murmurs, rubs  or bruits. Breast exam shows right breast with no masses, edema or erythema. There is no right axillary adenopathy. Left chest wall shows a drainage tube in. She has a healing mastectomy scar. No erythema is noted. No obvious fluid collection is noted. She has no palpable left axillary lymph node. Abdominal exam shows no fluid wave. There is no guarding or rebound tenderness. There is no abdominal mass. Back exam shows no tenderness over the spine, ribs or hips. Extremities shows no clubbing, cyanosis or edema. She has the left knee arthroplasty scar. This is well-healed. There is minimal swelling noted. She does have a little bit of erythema just below the left knee. No lymphedema is noted in the arms. Skin exam shows no rashes, ecchymoses or petechia. Neurological  exam is nonfoca  Lab Results  Component Value Date   WBC 5.3 02/03/2016   HGB 13.5 02/03/2016   HCT 42.3 02/03/2016   MCV 87 02/03/2016   PLT 228 02/03/2016     Chemistry      Component Value Date/Time   NA 141 02/03/2016 1118   K 4.0 02/03/2016 1118   CL 104 11/04/2015 1454   CL 103 07/29/2015 1301   CO2 26 02/03/2016 1118   BUN 21.9 02/03/2016 1118   CREATININE 0.9 02/03/2016 1118      Component Value Date/Time   CALCIUM 10.0 02/03/2016 1118   ALKPHOS 99 02/03/2016 1118   AST 32 02/03/2016 1118   ALT 27 02/03/2016 1118   BILITOT 0.36 02/03/2016 1118         Impression and Plan: Kimberly [name] is a 75 year old postmenopausal female with stage IIb ductal carcinoma of the left breast. She had one positive lymph node. She had a very low Oncotype score of 7, so I do not think that there will be a role for chemotherapy.  From my point of view, everything really looks quite good. The surgery for her left knee went well. She is getting around better.  We will go ahead and plan to get her back to see [place] in 4 more months. At that point, she will get a dose of Prolia.   Volanda Napoleon, MD 12/13/20175:46 PM

## 2016-02-04 LAB — VITAMIN D 25 HYDROXY (VIT D DEFICIENCY, FRACTURES): Vitamin D, 25-Hydroxy: 50.8 ng/mL (ref 30.0–100.0)

## 2016-03-12 ENCOUNTER — Other Ambulatory Visit: Payer: Self-pay | Admitting: Hematology & Oncology

## 2016-03-12 DIAGNOSIS — M818 Other osteoporosis without current pathological fracture: Secondary | ICD-10-CM

## 2016-03-12 DIAGNOSIS — C50112 Malignant neoplasm of central portion of left female breast: Secondary | ICD-10-CM

## 2016-03-12 DIAGNOSIS — E559 Vitamin D deficiency, unspecified: Secondary | ICD-10-CM

## 2016-03-12 DIAGNOSIS — T386X5A Adverse effect of antigonadotrophins, antiestrogens, antiandrogens, not elsewhere classified, initial encounter: Secondary | ICD-10-CM

## 2016-03-30 ENCOUNTER — Other Ambulatory Visit: Payer: Self-pay | Admitting: *Deleted

## 2016-03-30 DIAGNOSIS — M818 Other osteoporosis without current pathological fracture: Secondary | ICD-10-CM

## 2016-03-30 DIAGNOSIS — E559 Vitamin D deficiency, unspecified: Secondary | ICD-10-CM

## 2016-03-30 DIAGNOSIS — T386X5A Adverse effect of antigonadotrophins, antiestrogens, antiandrogens, not elsewhere classified, initial encounter: Secondary | ICD-10-CM

## 2016-03-30 DIAGNOSIS — C50112 Malignant neoplasm of central portion of left female breast: Secondary | ICD-10-CM

## 2016-03-30 MED ORDER — LETROZOLE 2.5 MG PO TABS: 2.5000 mg | ORAL_TABLET | Freq: Every day | ORAL | 11 refills | Status: DC

## 2016-04-07 ENCOUNTER — Other Ambulatory Visit: Payer: Self-pay | Admitting: *Deleted

## 2016-04-07 DIAGNOSIS — M818 Other osteoporosis without current pathological fracture: Secondary | ICD-10-CM

## 2016-04-07 DIAGNOSIS — E559 Vitamin D deficiency, unspecified: Secondary | ICD-10-CM

## 2016-04-07 DIAGNOSIS — T386X5A Adverse effect of antigonadotrophins, antiestrogens, antiandrogens, not elsewhere classified, initial encounter: Secondary | ICD-10-CM

## 2016-04-07 DIAGNOSIS — C50112 Malignant neoplasm of central portion of left female breast: Secondary | ICD-10-CM

## 2016-04-07 MED ORDER — LETROZOLE 2.5 MG PO TABS: 2.5000 mg | ORAL_TABLET | Freq: Every day | ORAL | 3 refills | Status: DC

## 2016-04-27 ENCOUNTER — Other Ambulatory Visit (HOSPITAL_BASED_OUTPATIENT_CLINIC_OR_DEPARTMENT_OTHER): Payer: Medicare Other

## 2016-04-27 ENCOUNTER — Ambulatory Visit (HOSPITAL_BASED_OUTPATIENT_CLINIC_OR_DEPARTMENT_OTHER): Payer: Medicare Other

## 2016-04-27 ENCOUNTER — Ambulatory Visit (HOSPITAL_BASED_OUTPATIENT_CLINIC_OR_DEPARTMENT_OTHER): Payer: Medicare Other | Admitting: Hematology & Oncology

## 2016-04-27 ENCOUNTER — Ambulatory Visit: Payer: Medicare Other

## 2016-04-27 VITALS — BP 141/67 | HR 58 | Temp 97.8°F | Resp 20 | Wt 215.1 lb

## 2016-04-27 DIAGNOSIS — Z79811 Long term (current) use of aromatase inhibitors: Secondary | ICD-10-CM | POA: Diagnosis not present

## 2016-04-27 DIAGNOSIS — C779 Secondary and unspecified malignant neoplasm of lymph node, unspecified: Secondary | ICD-10-CM | POA: Diagnosis not present

## 2016-04-27 DIAGNOSIS — Z170 Estrogen receptor positive status [ER+]: Principal | ICD-10-CM

## 2016-04-27 DIAGNOSIS — C50912 Malignant neoplasm of unspecified site of left female breast: Secondary | ICD-10-CM

## 2016-04-27 DIAGNOSIS — C50112 Malignant neoplasm of central portion of left female breast: Secondary | ICD-10-CM

## 2016-04-27 DIAGNOSIS — C50212 Malignant neoplasm of upper-inner quadrant of left female breast: Secondary | ICD-10-CM

## 2016-04-27 DIAGNOSIS — M818 Other osteoporosis without current pathological fracture: Secondary | ICD-10-CM

## 2016-04-27 LAB — CBC WITH DIFFERENTIAL (CANCER CENTER ONLY)
BASO#: 0.1 10*3/uL (ref 0.0–0.2)
BASO%: 1.8 % (ref 0.0–2.0)
EOS%: 12.4 % — ABNORMAL HIGH (ref 0.0–7.0)
Eosinophils Absolute: 0.6 10*3/uL — ABNORMAL HIGH (ref 0.0–0.5)
HEMATOCRIT: 43.5 % (ref 34.8–46.6)
HGB: 14.1 g/dL (ref 11.6–15.9)
LYMPH#: 1 10*3/uL (ref 0.9–3.3)
LYMPH%: 21.1 % (ref 14.0–48.0)
MCH: 28.7 pg (ref 26.0–34.0)
MCHC: 32.4 g/dL (ref 32.0–36.0)
MCV: 88 fL (ref 81–101)
MONO#: 0.7 10*3/uL (ref 0.1–0.9)
MONO%: 13.2 % — ABNORMAL HIGH (ref 0.0–13.0)
NEUT#: 2.5 10*3/uL (ref 1.5–6.5)
NEUT%: 51.5 % (ref 39.6–80.0)
Platelets: 197 10*3/uL (ref 145–400)
RBC: 4.92 10*6/uL (ref 3.70–5.32)
RDW: 15.5 % (ref 11.1–15.7)
WBC: 4.9 10*3/uL (ref 3.9–10.0)

## 2016-04-27 LAB — CMP (CANCER CENTER ONLY)
ALBUMIN: 3.6 g/dL (ref 3.3–5.5)
ALT(SGPT): 23 U/L (ref 10–47)
AST: 31 U/L (ref 11–38)
Alkaline Phosphatase: 89 U/L — ABNORMAL HIGH (ref 26–84)
BUN, Bld: 18 mg/dL (ref 7–22)
CHLORIDE: 102 meq/L (ref 98–108)
CO2: 30 meq/L (ref 18–33)
CREATININE: 1 mg/dL (ref 0.6–1.2)
Calcium: 10.2 mg/dL (ref 8.0–10.3)
GLUCOSE: 87 mg/dL (ref 73–118)
Potassium: 4.6 mEq/L (ref 3.3–4.7)
SODIUM: 145 meq/L (ref 128–145)
Total Bilirubin: 0.6 mg/dl (ref 0.20–1.60)
Total Protein: 6.7 g/dL (ref 6.4–8.1)

## 2016-04-27 MED ORDER — DENOSUMAB 60 MG/ML ~~LOC~~ SOLN
60.0000 mg | Freq: Once | SUBCUTANEOUS | Status: AC
  Administered 2016-04-27: 60 mg via SUBCUTANEOUS
  Filled 2016-04-27: qty 1

## 2016-04-27 NOTE — Patient Instructions (Signed)

## 2016-04-27 NOTE — Progress Notes (Signed)
Hematology and Oncology Follow Up Visit  Kimberly [name] [id number] 1940/08/11 76 y.o. 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  Oncotype score is 7  Current Therapy:    Femara 2.5 mg by mouth daily  Prolia 60 mg subcutaneous every 6 months - next dose in October 2018  S/p XRT - completed in May 2017 - 50.4 [name]     Interim History:  Kimberly [name] is back for follow-up. She really looks good. The surgery on her left knee went well. She had this back in July. She really got to this nicely. She has good range of motion of the left knee. She's completed physical therapy for this.  The left mastectomy site has healed up nicely. She's had no problems with this. She does not have a see the plastic surgeon for any follow-up.  She's had no problems with fatigue or weakness. She's had no nausea or vomiting. She's had no cough or shortness of breath.  Of note, she completed radiation therapy back in early June.  She is on Femara. She's not had any arthralgias with Femara.   Overall, her performance status is ECOG 1.   Medications:  Current Outpatient Prescriptions:  .  acetaminophen (TYLENOL) 650 MG CR tablet, Take 1,300 mg by mouth. Take 2 pills in the PM, Disp: , Rfl:  .  amoxicillin (AMOXIL) 500 MG capsule, 2,000 mg. , Disp: , Rfl:  .  Ascorbic Acid (VITAMIN C) 1000 MG tablet, Take 1,000 mg by mouth daily., Disp: , Rfl:  .  aspirin EC 81 MG tablet, Take 81 mg by mouth daily., Disp: , Rfl:  .  calcium carbonate (CALCIUM 600) 600 MG TABS tablet, Take 600 mg by mouth daily. , Disp: , Rfl:  .  Cholecalciferol (VITAMIN D) 2000 units CAPS, Take by mouth., Disp: , Rfl:  .  Glucosamine-Chondroit-Vit C-Mn (GLUCOSAMINE CHONDR 500 COMPLEX PO), Take by mouth., Disp: , Rfl:  .  letrozole (FEMARA) 2.5 MG tablet, Take 1 tablet (2.5 mg total) by mouth daily., Disp: 90 tablet, Rfl: 3 .  loratadine (CLARITIN) 10 MG tablet, Take 10 mg by  mouth daily., Disp: , Rfl:  .  meclizine (ANTIVERT) 25 MG tablet, Take by mouth., Disp: , Rfl:  .  Multiple Vitamin (MULTI-VITAMINS) TABS, Take by mouth., Disp: , Rfl:  .  naproxen sodium (ANAPROX) 220 MG tablet, Take 220 mg by mouth daily., Disp: , Rfl:  .  Pyridoxine HCl (VITAMIN B-6 PO), Take by mouth daily. , Disp: , Rfl:  .  Turmeric 500 MG TABS, Take by mouth daily. , Disp: , Rfl:   Allergies:  Allergies  Allergen Reactions  . Codeine Nausea And Vomiting    Past Medical History, Surgical history, Social history, and Family History were reviewed and updated.  Review of Systems: As above  Physical Exam:  weight is 215 lb 1.9 oz (97.6 kg). Her oral temperature is 97.8 F (36.6 C). Her blood pressure is 141/67 (abnormal) and her pulse is 58 (abnormal). Her respiration is 20.   Wt Readings from Last 3 Encounters:  04/27/16 215 lb 1.9 oz (97.6 kg)  02/03/16 211 lb 6.4 oz (95.9 kg)  11/04/15 209 lb (94.8 kg)     Head and neck exam shows no ocular or oral lesions. She has no palpable cervical or supraclavicular lymph nodes. Lungs are clear. Cardiac exam regular rate and rhythm with no murmurs, rubs or bruits. Breast exam shows right  breast with no masses, edema or erythema. There is no right axillary adenopathy. Left chest wall shows a drainage tube in. She has a healing mastectomy scar. No erythema is noted. No obvious fluid collection is noted. She has no palpable left axillary lymph node. Abdominal exam shows no fluid wave. There is no guarding or rebound tenderness. There is no abdominal mass. Back exam shows no tenderness over the spine, ribs or hips. Extremities shows no clubbing, cyanosis or edema. She has the left knee arthroplasty scar. This is well-healed. There is minimal swelling noted. She does have a little bit of erythema just below the left knee. No lymphedema is noted in the arms. Skin exam shows no rashes, ecchymoses or petechia. Neurological exam is nonfoca  Lab  Results  Component Value Date   WBC 4.9 04/27/2016   HGB 14.1 04/27/2016   HCT 43.5 04/27/2016   MCV 88 04/27/2016   PLT 197 04/27/2016     Chemistry      Component Value Date/Time   NA 145 04/27/2016 1044   NA 141 02/03/2016 1118   K 4.6 04/27/2016 1044   K 4.0 02/03/2016 1118   CL 102 04/27/2016 1044   CO2 30 04/27/2016 1044   CO2 26 02/03/2016 1118   BUN 18 04/27/2016 1044   BUN 21.9 02/03/2016 1118   CREATININE 1.0 04/27/2016 1044   CREATININE 0.9 02/03/2016 1118      Component Value Date/Time   CALCIUM 10.2 04/27/2016 1044   CALCIUM 10.0 02/03/2016 1118   ALKPHOS 89 (H) 04/27/2016 1044   ALKPHOS 99 02/03/2016 1118   AST 31 04/27/2016 1044   AST 32 02/03/2016 1118   ALT 23 04/27/2016 1044   ALT 27 02/03/2016 1118   BILITOT 0.60 04/27/2016 1044   BILITOT 0.36 02/03/2016 1118         Impression and Plan: Kimberly [name] is a 76 year old postmenopausal female with stage IIb ductal carcinoma of the left breast. She had one positive lymph node. She had a very low Oncotype score of 7, so I did not think that there will be a role for chemotherapy.  From my point of view, everything really looks quite good. The surgery for her left knee went well. She is getting around better.  We will go ahead and plan to get her back to see [place] in 6 more months. At that point, she will get a dose of Prolia.   Volanda Napoleon, MD 3/7/201811:56 AM

## 2016-08-09 ENCOUNTER — Other Ambulatory Visit: Payer: Self-pay | Admitting: Hematology & Oncology

## 2016-08-09 DIAGNOSIS — Z1231 Encounter for screening mammogram for malignant neoplasm of breast: Secondary | ICD-10-CM

## 2016-09-09 ENCOUNTER — Ambulatory Visit
Admission: RE | Admit: 2016-09-09 | Discharge: 2016-09-09 | Disposition: A | Discharge status: Home / Self Care | Payer: Medicare Other | Source: Ambulatory Visit | Attending: Hematology & Oncology | Admitting: Hematology & Oncology

## 2016-09-09 ENCOUNTER — Other Ambulatory Visit: Payer: Self-pay | Admitting: Hematology & Oncology

## 2016-09-09 DIAGNOSIS — Z1231 Encounter for screening mammogram for malignant neoplasm of breast: Secondary | ICD-10-CM

## 2016-09-09 HISTORY — DX: Personal history of antineoplastic chemotherapy: Z92.21

## 2016-09-09 HISTORY — DX: Personal history of irradiation: Z92.3

## 2016-11-01 ENCOUNTER — Ambulatory Visit (HOSPITAL_BASED_OUTPATIENT_CLINIC_OR_DEPARTMENT_OTHER): Payer: Medicare Other

## 2016-11-01 ENCOUNTER — Ambulatory Visit (HOSPITAL_BASED_OUTPATIENT_CLINIC_OR_DEPARTMENT_OTHER): Payer: Medicare Other | Admitting: Hematology & Oncology

## 2016-11-01 ENCOUNTER — Other Ambulatory Visit (HOSPITAL_BASED_OUTPATIENT_CLINIC_OR_DEPARTMENT_OTHER): Payer: Medicare Other

## 2016-11-01 VITALS — BP 113/69 | HR 57 | Temp 98.1°F | Resp 18 | Wt 215.0 lb

## 2016-11-01 DIAGNOSIS — C50912 Malignant neoplasm of unspecified site of left female breast: Secondary | ICD-10-CM

## 2016-11-01 DIAGNOSIS — Z79811 Long term (current) use of aromatase inhibitors: Secondary | ICD-10-CM | POA: Diagnosis not present

## 2016-11-01 DIAGNOSIS — C50112 Malignant neoplasm of central portion of left female breast: Secondary | ICD-10-CM

## 2016-11-01 DIAGNOSIS — N951 Menopausal and female climacteric states: Secondary | ICD-10-CM | POA: Diagnosis not present

## 2016-11-01 DIAGNOSIS — M818 Other osteoporosis without current pathological fracture: Secondary | ICD-10-CM

## 2016-11-01 DIAGNOSIS — C779 Secondary and unspecified malignant neoplasm of lymph node, unspecified: Secondary | ICD-10-CM

## 2016-11-01 DIAGNOSIS — Z170 Estrogen receptor positive status [ER+]: Secondary | ICD-10-CM | POA: Diagnosis not present

## 2016-11-01 DIAGNOSIS — C50212 Malignant neoplasm of upper-inner quadrant of left female breast: Secondary | ICD-10-CM

## 2016-11-01 LAB — CMP (CANCER CENTER ONLY)
ALBUMIN: 3.7 g/dL (ref 3.3–5.5)
ALT: 29 U/L (ref 10–47)
AST: 37 U/L (ref 11–38)
Alkaline Phosphatase: 82 U/L (ref 26–84)
BILIRUBIN TOTAL: 0.6 mg/dL (ref 0.20–1.60)
BUN, Bld: 25 mg/dL — ABNORMAL HIGH (ref 7–22)
CO2: 30 meq/L (ref 18–33)
CREATININE: 0.9 mg/dL (ref 0.6–1.2)
Calcium: 10 mg/dL (ref 8.0–10.3)
Chloride: 107 mEq/L (ref 98–108)
Glucose, Bld: 93 mg/dL (ref 73–118)
Potassium: 4.8 mEq/L — ABNORMAL HIGH (ref 3.3–4.7)
SODIUM: 143 meq/L (ref 128–145)
TOTAL PROTEIN: 7 g/dL (ref 6.4–8.1)

## 2016-11-01 LAB — CBC WITH DIFFERENTIAL (CANCER CENTER ONLY)
BASO#: 0.1 10*3/uL (ref 0.0–0.2)
BASO%: 1.2 % (ref 0.0–2.0)
EOS%: 10.4 % — ABNORMAL HIGH (ref 0.0–7.0)
Eosinophils Absolute: 0.6 10*3/uL — ABNORMAL HIGH (ref 0.0–0.5)
HCT: 44 % (ref 34.8–46.6)
HGB: 14.3 g/dL (ref 11.6–15.9)
LYMPH#: 1.1 10*3/uL (ref 0.9–3.3)
LYMPH%: 18.3 % (ref 14.0–48.0)
MCH: 29.2 pg (ref 26.0–34.0)
MCHC: 32.5 g/dL (ref 32.0–36.0)
MCV: 90 fL (ref 81–101)
MONO#: 0.8 10*3/uL (ref 0.1–0.9)
MONO%: 13.2 % — ABNORMAL HIGH (ref 0.0–13.0)
NEUT#: 3.5 10*3/uL (ref 1.5–6.5)
NEUT%: 56.9 % (ref 39.6–80.0)
Platelets: 182 10*3/uL (ref 145–400)
RBC: 4.9 10*6/uL (ref 3.70–5.32)
RDW: 14.8 % (ref 11.1–15.7)
WBC: 6.1 10*3/uL (ref 3.9–10.0)

## 2016-11-01 MED ORDER — DENOSUMAB 60 MG/ML ~~LOC~~ SOLN
60.0000 mg | Freq: Once | SUBCUTANEOUS | Status: AC
  Administered 2016-11-01: 60 mg via SUBCUTANEOUS
  Filled 2016-11-01: qty 1

## 2016-11-01 MED ORDER — DENOSUMAB 120 MG/1.7ML ~~LOC~~ SOLN
SUBCUTANEOUS | Status: AC
  Filled 2016-11-01: qty 1.7

## 2016-11-01 NOTE — Progress Notes (Signed)
Hematology and Oncology Follow Up Visit  Kimberly [name] [id number] [birth year] 76 y.o. 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  Oncotype score is 7  Current Therapy:    Femara 2.5 mg by mouth daily  Prolia 60 mg subcutaneous every 6 months - next dose in March 2019  S/p XRT - completed in May 2017 - 50.4 [name]     Interim History:  Ms. Kimberly [name] is back for follow-up. She really looks good.   The left mastectomy site has healed up nicely. She's had no problems with this. She does not have a see the plastic surgeon for any follow-up.  She's had no problems with fatigue or weakness. She's had no nausea or vomiting. She's had no cough or shortness of breath.  She is on Femara. She's not had any arthralgias with Femara. She does have some hot flashes. Off her some medication for this. She thinks that she can get through it herself.  Overall, her performance status is ECOG 1.   Medications:  Current Outpatient Prescriptions:  .  acetaminophen (TYLENOL) 650 MG CR tablet, Take 1,300 mg by mouth. Take 2 pills in the PM, Disp: , Rfl:  .  Ascorbic Acid (VITAMIN C) 1000 MG tablet, Take 1,000 mg by mouth daily., Disp: , Rfl:  .  aspirin EC 81 MG tablet, Take 81 mg by mouth daily., Disp: , Rfl:  .  calcium carbonate (CALCIUM 600) 600 MG TABS tablet, Take 600 mg by mouth daily. , Disp: , Rfl:  .  Cholecalciferol (VITAMIN D) 2000 units CAPS, Take by mouth., Disp: , Rfl:  .  Glucosamine-Chondroit-Vit C-Mn (GLUCOSAMINE CHONDR 500 COMPLEX PO), Take by mouth., Disp: , Rfl:  .  letrozole (FEMARA) 2.5 MG tablet, Take 1 tablet (2.5 mg total) by mouth daily., Disp: 90 tablet, Rfl: 3 .  loratadine (CLARITIN) 10 MG tablet, Take 10 mg by mouth daily., Disp: , Rfl:  .  meclizine (ANTIVERT) 25 MG tablet, Take by mouth., Disp: , Rfl:  .  Multiple Vitamin (MULTI-VITAMINS) TABS, Take by mouth., Disp: , Rfl:  .  naproxen sodium (ANAPROX) 220 MG  tablet, Take 220 mg by mouth daily., Disp: , Rfl:  .  Pyridoxine HCl (VITAMIN B-6 PO), Take by mouth daily. , Disp: , Rfl:  .  Turmeric 500 MG TABS, Take by mouth daily. , Disp: , Rfl:   Allergies:  Allergies  Allergen Reactions  . Codeine Nausea And Vomiting    Past Medical History, Surgical history, Social history, and Family History were reviewed and updated.  Review of Systems: As above  Physical Exam:  weight is 215 lb (97.5 kg). Her oral temperature is 98.1 F (36.7 C). Her blood pressure is 113/69 and her pulse is 57 (abnormal). Her respiration is 18 and oxygen saturation is 98%.   Wt Readings from Last 3 Encounters:  11/01/16 215 lb (97.5 kg)  04/27/16 215 lb 1.9 oz (97.6 kg)  02/03/16 211 lb 6.4 oz (95.9 kg)     Head and neck exam shows no ocular or oral lesions. She has no palpable cervical or supraclavicular lymph nodes. Lungs are clear. Cardiac exam regular rate and rhythm with no murmurs, rubs or bruits. Breast exam shows right breast with no masses, edema or erythema. There is no right axillary adenopathy. Left chest wall shows a drainage tube in. She has a healing mastectomy scar. No erythema is noted. No obvious fluid collection is noted. She  has no palpable left axillary lymph node. Abdominal exam shows no fluid wave. There is no guarding or rebound tenderness. There is no abdominal mass. Back exam shows no tenderness over the spine, ribs or hips. Extremities shows no clubbing, cyanosis or edema. She has the left knee arthroplasty scar. This is well-healed. There is minimal swelling noted. She does have a little bit of erythema just below the left knee. No lymphedema is noted in the arms. Skin exam shows no rashes, ecchymoses or petechia. Neurological exam is nonfoca  Lab Results  Component Value Date   WBC 6.1 11/01/2016   HGB 14.3 11/01/2016   HCT 44.0 11/01/2016   MCV 90 11/01/2016   PLT 182 11/01/2016     Chemistry      Component Value Date/Time   NA 143  11/01/2016 1030   NA 141 02/03/2016 1118   K 4.8 (H) 11/01/2016 1030   K 4.0 02/03/2016 1118   CL 107 11/01/2016 1030   CO2 30 11/01/2016 1030   CO2 26 02/03/2016 1118   BUN 25 (H) 11/01/2016 1030   BUN 21.9 02/03/2016 1118   CREATININE 0.9 11/01/2016 1030   CREATININE 0.9 02/03/2016 1118      Component Value Date/Time   CALCIUM 10.0 11/01/2016 1030   CALCIUM 10.0 02/03/2016 1118   ALKPHOS 82 11/01/2016 1030   ALKPHOS 99 02/03/2016 1118   AST 37 11/01/2016 1030   AST 32 02/03/2016 1118   ALT 29 11/01/2016 1030   ALT 27 02/03/2016 1118   BILITOT 0.60 11/01/2016 1030   BILITOT 0.36 02/03/2016 1118         Impression and Plan: Ms. [name] is a 76 year old postmenopausal female with stage IIb ductal carcinoma of the left breast. She had one positive lymph node. She had a very low Oncotype score of 7, so I did not think that there will be a role for chemotherapy.  We will go ahead and plan to get her back to see [place] in 6 more months. At that point, she will get a dose of Prolia.   Volanda Napoleon, MD 9/11/20181:30 PM

## 2016-11-01 NOTE — Patient Instructions (Signed)

## 2016-11-02 ENCOUNTER — Telehealth: Payer: Self-pay | Admitting: *Deleted

## 2016-11-02 LAB — VITAMIN D 25 HYDROXY (VIT D DEFICIENCY, FRACTURES): Vitamin D, 25-Hydroxy: 49.7 ng/mL (ref 30.0–100.0)

## 2016-11-02 NOTE — Telephone Encounter (Addendum)
Patient aware of results  ----- Message from Volanda Napoleon, MD sent at 11/02/2016  6:33 AM EDT ----- Call - the Vit D level is excellent!!!  NO change is dosing!! pete

## 2017-01-21 ENCOUNTER — Other Ambulatory Visit: Payer: Self-pay | Admitting: Nurse Practitioner

## 2017-01-27 ENCOUNTER — Encounter: Payer: Self-pay | Admitting: Hematology & Oncology

## 2017-04-17 ENCOUNTER — Other Ambulatory Visit: Payer: Self-pay | Admitting: *Deleted

## 2017-04-17 DIAGNOSIS — E559 Vitamin D deficiency, unspecified: Secondary | ICD-10-CM

## 2017-04-17 DIAGNOSIS — T386X5A Adverse effect of antigonadotrophins, antiestrogens, antiandrogens, not elsewhere classified, initial encounter: Secondary | ICD-10-CM

## 2017-04-17 DIAGNOSIS — M818 Other osteoporosis without current pathological fracture: Secondary | ICD-10-CM

## 2017-04-17 DIAGNOSIS — C50112 Malignant neoplasm of central portion of left female breast: Secondary | ICD-10-CM

## 2017-04-17 MED ORDER — LETROZOLE 2.5 MG PO TABS: 2.5000 mg | ORAL_TABLET | Freq: Every day | ORAL | 3 refills | Status: DC

## 2017-04-28 IMAGING — US US BREAST LTD UNI LEFT INC AXILLA
1 series · 13 of 13 positions shown · non-contrast
Comparison: Previous exam(s).

CLINICAL DATA: 73-year-old female recalled from screening
mammography for a mass and calcifications in left breast.

EXAM:
DIGITAL DIAGNOSTIC LEFT MAMMOGRAM WITH 3D TOMOSYNTHESIS AND
ULTRASOUND LEFT BREAST

[Series 1: advbreast · 13 of 13 slices shown]
[im 1/13]
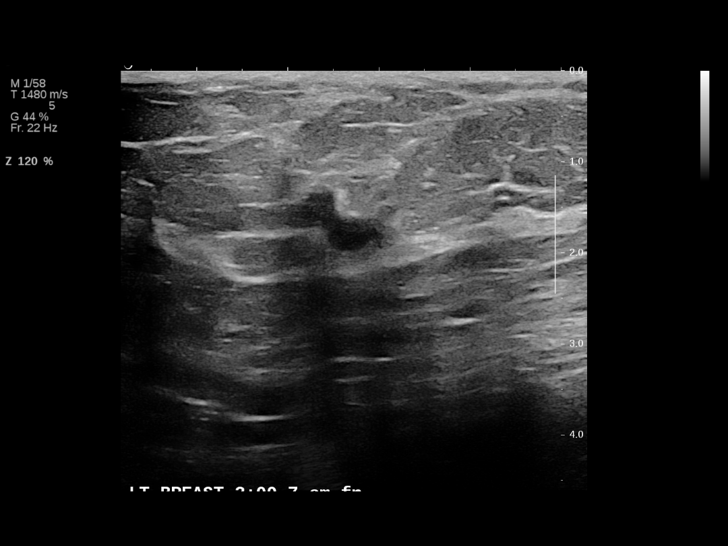
[im 2/13]
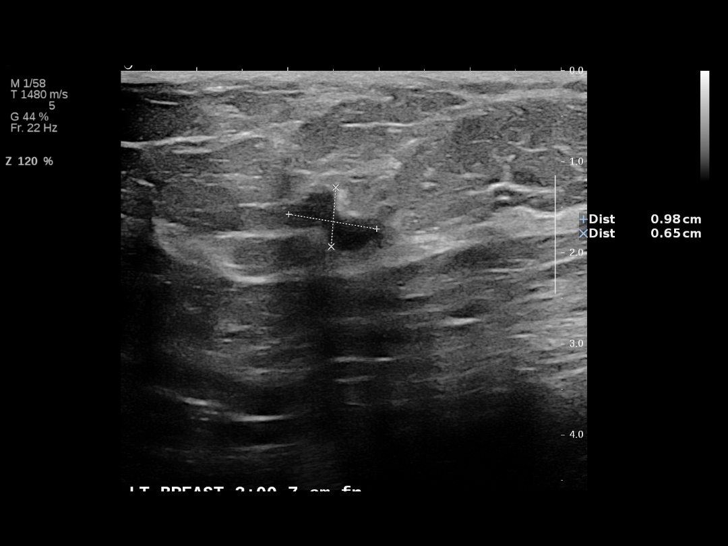
[im 3/13]
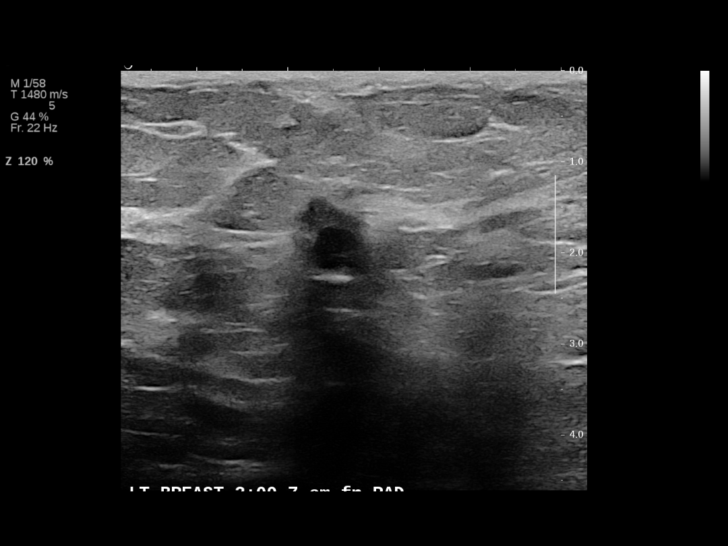
[im 4/13]
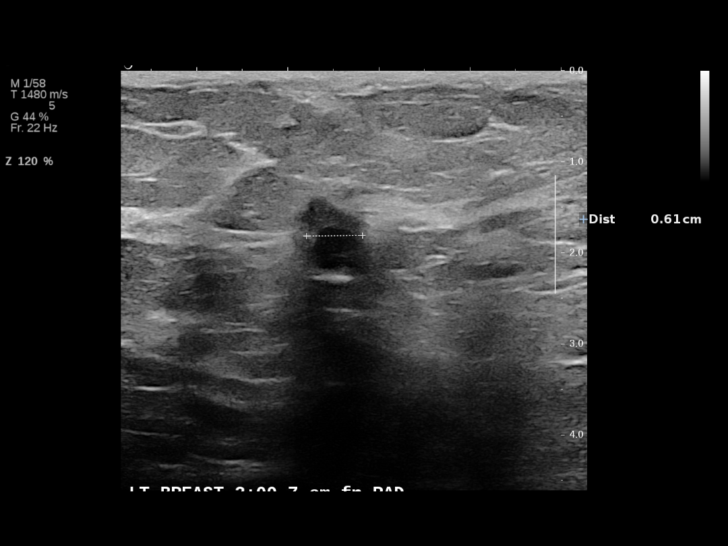
[im 5/13]
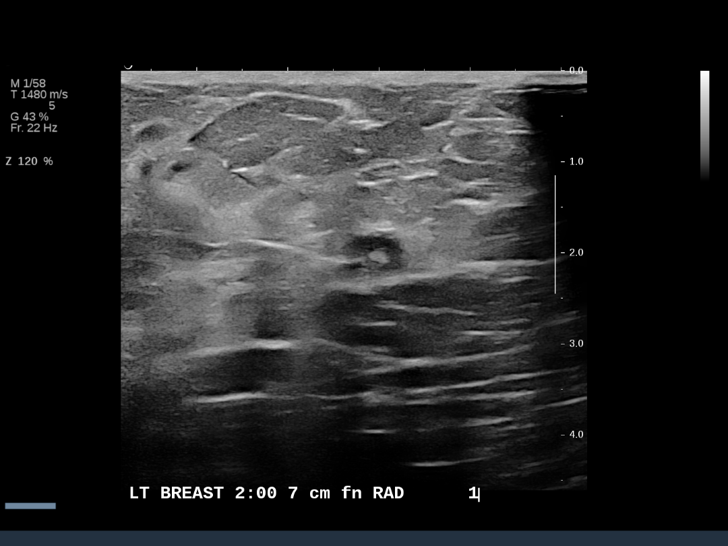
[im 6/13]
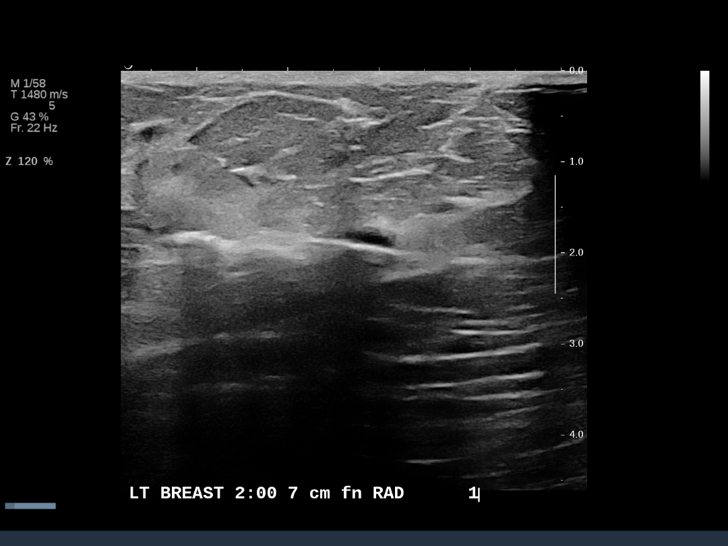
[im 7/13]
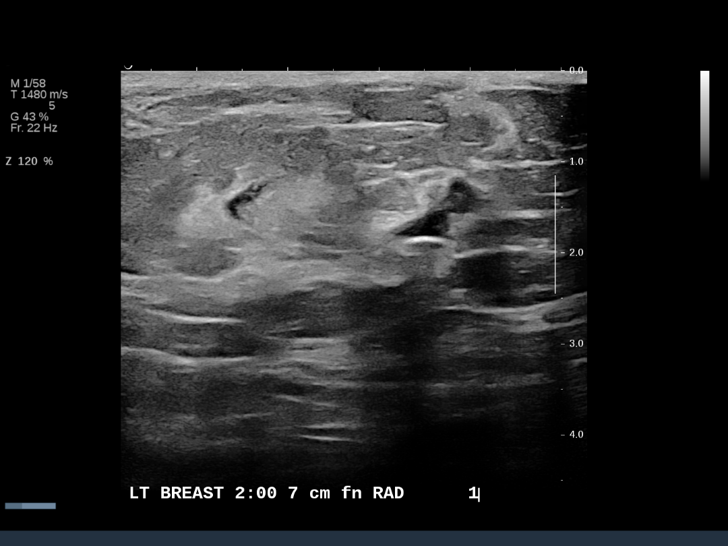
[im 8/13]
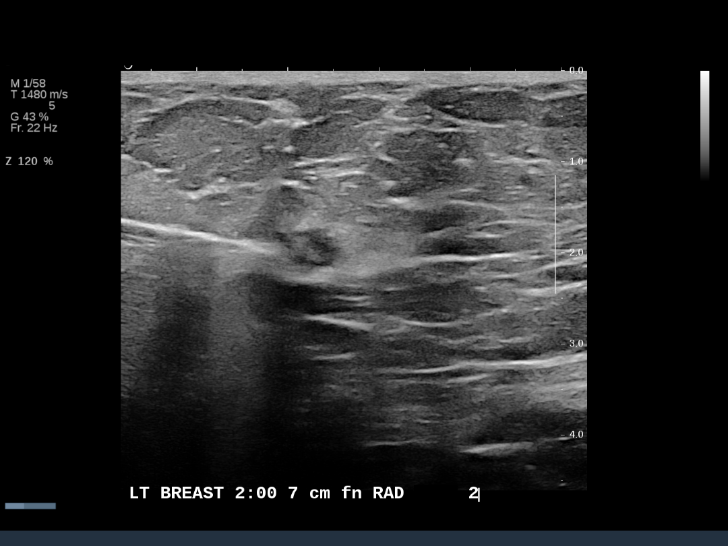
[im 9/13]
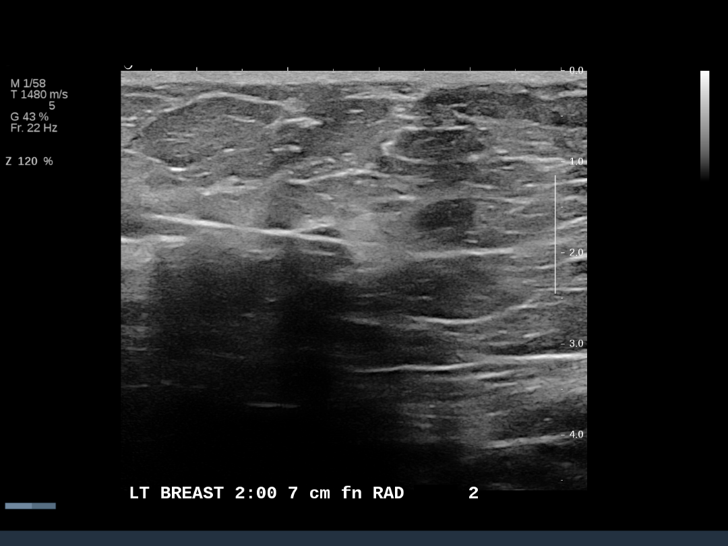
[im 10/13]
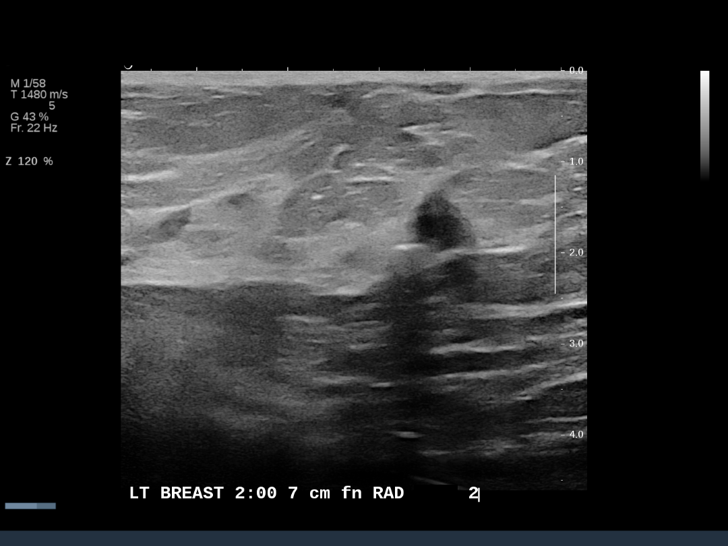
[im 11/13]
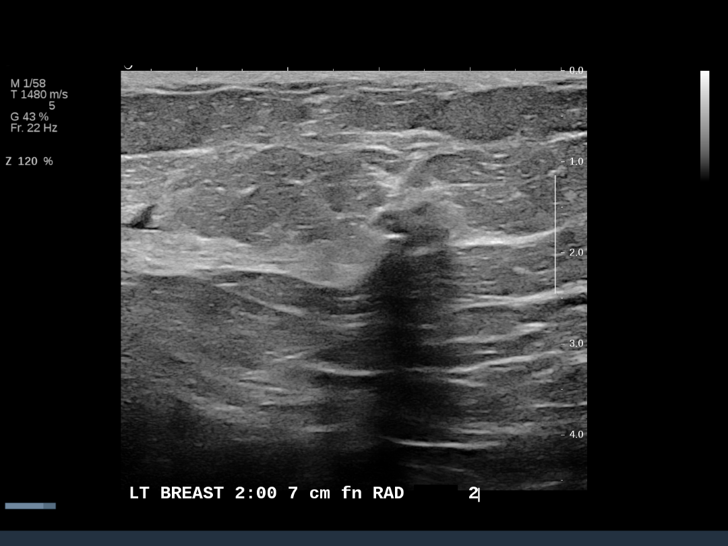
[im 12/13]
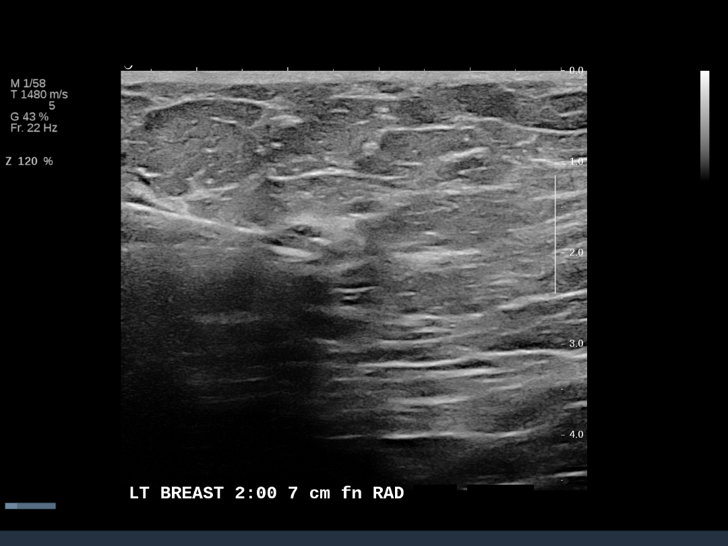
[im 13/13]
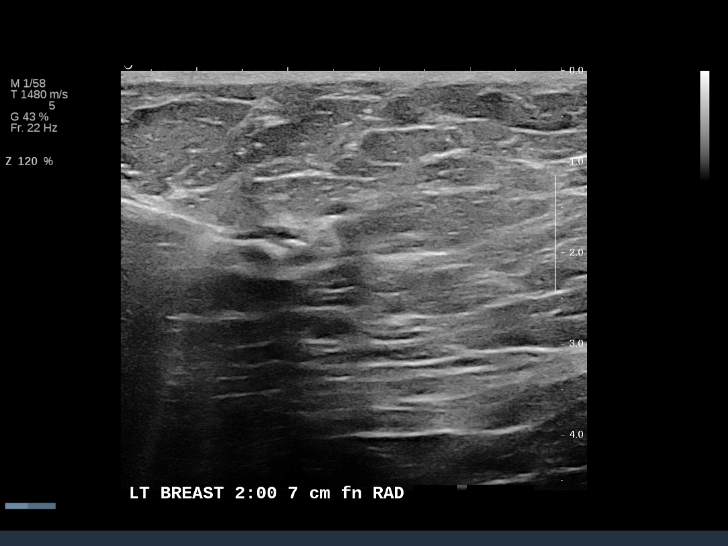

[13 of 13 positions shown; findings below may reference images not displayed]

ACR Breast Density Category b: There are scattered areas of
fibroglandular density.
FINDINGS: Additional imaging of the left breast was obtained. There is
persistence of a spiculated 1.5 x 1.4 x 1.0 cm mass. Surrounding the
mass are diffuse developing calcifications some with linear forms.
The mass and surrounding calcifications span an area of 2.0 x 1.5 x
4.3 cm. Magnification views of the subareolar region of the left
breast shows diffuse faint calcifications that have a benign appear.

On physical exam, I do not palpate a mass in the left breast.

Targeted ultrasound is performed, showing hypoechoic irregular mass
in the left breast at 2 o'clock 7 cm from the nipple measuring 1.0 x
0.7 x 0.6 cm. Sonographic evaluation the left axilla does not show
any enlarged adenopathy.
IMPRESSION: 1.  Suspicious mass in the 2 o'clock region of the left breast.

2.  Suspicious calcifications surrounding the mass.

RECOMMENDATION:
1. Ultrasound-guided core biopsy of the mass in the 2 o'clock region
of the left breast is recommended. This will be performed and
dictated separately.

2. If the ultrasound-guided core biopsy of the mass in the 2 o'clock
region is positive for invasive mammary carcinoma I would recommend
an MRI for further evaluation. If the MRI does not show any
additional areas of enhancement I would recommend stereotactic
biopsy of calcifications inferior and medial to the mass to
determine extent of disease.

I have discussed the findings and recommendations with the patient.
Results were also provided in writing at the conclusion of the
visit. If applicable, a reminder letter will be sent to the patient
regarding the next appointment.

BI-RADS CATEGORY  5: Highly suggestive of malignancy.

## 2017-05-02 ENCOUNTER — Inpatient Hospital Stay: Payer: Medicare Other | Attending: Hematology & Oncology

## 2017-05-02 ENCOUNTER — Inpatient Hospital Stay: Payer: Medicare Other

## 2017-05-02 ENCOUNTER — Other Ambulatory Visit: Payer: Self-pay

## 2017-05-02 ENCOUNTER — Encounter: Payer: Self-pay | Admitting: Hematology & Oncology

## 2017-05-02 ENCOUNTER — Inpatient Hospital Stay (HOSPITAL_BASED_OUTPATIENT_CLINIC_OR_DEPARTMENT_OTHER): Payer: Medicare Other | Admitting: Hematology & Oncology

## 2017-05-02 ENCOUNTER — Telehealth: Payer: Self-pay

## 2017-05-02 VITALS — BP 151/82 | HR 81 | Temp 97.4°F | Resp 16 | Wt 201.0 lb

## 2017-05-02 DIAGNOSIS — C779 Secondary and unspecified malignant neoplasm of lymph node, unspecified: Secondary | ICD-10-CM | POA: Diagnosis not present

## 2017-05-02 DIAGNOSIS — C50112 Malignant neoplasm of central portion of left female breast: Secondary | ICD-10-CM

## 2017-05-02 DIAGNOSIS — R300 Dysuria: Secondary | ICD-10-CM

## 2017-05-02 DIAGNOSIS — M818 Other osteoporosis without current pathological fracture: Secondary | ICD-10-CM

## 2017-05-02 DIAGNOSIS — C50412 Malignant neoplasm of upper-outer quadrant of left female breast: Secondary | ICD-10-CM | POA: Diagnosis not present

## 2017-05-02 DIAGNOSIS — Z79811 Long term (current) use of aromatase inhibitors: Secondary | ICD-10-CM | POA: Insufficient documentation

## 2017-05-02 DIAGNOSIS — N951 Menopausal and female climacteric states: Secondary | ICD-10-CM

## 2017-05-02 DIAGNOSIS — Z170 Estrogen receptor positive status [ER+]: Secondary | ICD-10-CM

## 2017-05-02 DIAGNOSIS — C50212 Malignant neoplasm of upper-inner quadrant of left female breast: Secondary | ICD-10-CM

## 2017-05-02 LAB — URINALYSIS, COMPLETE (UACMP) WITH MICROSCOPIC
Bilirubin Urine: NEGATIVE
Glucose, UA: NEGATIVE mg/dL
Hgb urine dipstick: NEGATIVE
Ketones, ur: NEGATIVE mg/dL
Leukocytes, UA: NEGATIVE
Nitrite: NEGATIVE
PROTEIN: NEGATIVE mg/dL
SPECIFIC GRAVITY, URINE: 1.025 (ref 1.005–1.030)
pH: 5.5 (ref 5.0–8.0)

## 2017-05-02 LAB — CMP (CANCER CENTER ONLY)
ALK PHOS: 73 U/L (ref 26–84)
ALT: 33 U/L (ref 10–47)
ANION GAP: 9 (ref 5–15)
AST: 30 U/L (ref 11–38)
Albumin: 3.9 g/dL (ref 3.5–5.0)
BUN: 24 mg/dL — ABNORMAL HIGH (ref 7–22)
CO2: 32 mmol/L (ref 18–33)
Calcium: 10.1 mg/dL (ref 8.0–10.3)
Chloride: 106 mmol/L (ref 98–108)
Creatinine: 1.3 mg/dL — ABNORMAL HIGH (ref 0.60–1.20)
GLUCOSE: 95 mg/dL (ref 73–118)
POTASSIUM: 4.4 mmol/L (ref 3.3–4.7)
SODIUM: 147 mmol/L — AB (ref 128–145)
TOTAL PROTEIN: 7.1 g/dL (ref 6.4–8.1)
Total Bilirubin: 0.6 mg/dL (ref 0.2–1.6)

## 2017-05-02 LAB — CBC WITH DIFFERENTIAL (CANCER CENTER ONLY)
BASOS ABS: 0.1 10*3/uL (ref 0.0–0.1)
Basophils Relative: 1 %
Eosinophils Absolute: 0.6 10*3/uL — ABNORMAL HIGH (ref 0.0–0.5)
Eosinophils Relative: 11 %
HEMATOCRIT: 45.4 % (ref 34.8–46.6)
HEMOGLOBIN: 14.4 g/dL (ref 11.6–15.9)
LYMPHS ABS: 1.2 10*3/uL (ref 0.9–3.3)
LYMPHS PCT: 23 %
MCH: 28.9 pg (ref 26.0–34.0)
MCHC: 31.7 g/dL — ABNORMAL LOW (ref 32.0–36.0)
MCV: 91 fL (ref 81.0–101.0)
Monocytes Absolute: 0.7 10*3/uL (ref 0.1–0.9)
Monocytes Relative: 13 %
NEUTROS ABS: 2.6 10*3/uL (ref 1.5–6.5)
Neutrophils Relative %: 52 %
Platelet Count: 211 10*3/uL (ref 145–400)
RBC: 4.99 MIL/uL (ref 3.70–5.32)
RDW: 15 % (ref 11.1–15.7)
WBC: 5 10*3/uL (ref 3.9–10.0)

## 2017-05-02 MED ORDER — DENOSUMAB 60 MG/ML ~~LOC~~ SOLN
60.0000 mg | Freq: Once | SUBCUTANEOUS | Status: AC
  Administered 2017-05-02: 60 mg via SUBCUTANEOUS
  Filled 2017-05-02: qty 1

## 2017-05-02 NOTE — Patient Instructions (Signed)
Denosumab injection / Prolia °What is this medicine? °DENOSUMAB (den oh sue mab) slows bone breakdown. Prolia is used to treat osteoporosis in women after menopause and in men. Xgeva is used to treat a high calcium level due to cancer and to prevent bone fractures and other bone problems caused by multiple myeloma or cancer bone metastases. Xgeva is also used to treat giant cell tumor of the bone. °This medicine may be used for other purposes; ask your health care provider or pharmacist if you have questions. °COMMON BRAND NAME(S): Prolia, XGEVA °What should I tell my health care provider before I take this medicine? °They need to know if you have any of these conditions: °-dental disease °-having surgery or tooth extraction °-infection °-kidney disease °-low levels of calcium or Vitamin D in the blood °-malnutrition °-on hemodialysis °-skin conditions or sensitivity °-thyroid or parathyroid disease °-an unusual reaction to denosumab, other medicines, foods, dyes, or preservatives °-pregnant or trying to get pregnant °-breast-feeding °How should I use this medicine? °This medicine is for injection under the skin. It is given by a health care professional in a hospital or clinic setting. °If you are getting Prolia, a special MedGuide will be given to you by the pharmacist with each prescription and refill. Be sure to read this information carefully each time. °For Prolia, talk to your pediatrician regarding the use of this medicine in children. Special care may be needed. For Xgeva, talk to your pediatrician regarding the use of this medicine in children. While this drug may be prescribed for children as young as 13 years for selected conditions, precautions do apply. °Overdosage: If you think you have taken too much of this medicine contact a poison control center or emergency room at once. °NOTE: This medicine is only for you. Do not share this medicine with others. °What if I miss a dose? °It is important not to  miss your dose. Call your doctor or health care professional if you are unable to keep an appointment. °What may interact with this medicine? °Do not take this medicine with any of the following medications: °-other medicines containing denosumab °This medicine may also interact with the following medications: °-medicines that lower your chance of fighting infection °-steroid medicines like prednisone or cortisone °This list may not describe all possible interactions. Give your health care provider a list of all the medicines, herbs, non-prescription drugs, or dietary supplements you use. Also tell them if you smoke, drink alcohol, or use illegal drugs. Some items may interact with your medicine. °What should I watch for while using this medicine? °Visit your doctor or health care professional for regular checks on your progress. Your doctor or health care professional may order blood tests and other tests to see how you are doing. °Call your doctor or health care professional for advice if you get a fever, chills or sore throat, or other symptoms of a cold or flu. Do not treat yourself. This drug may decrease your body's ability to fight infection. Try to avoid being around people who are sick. °You should make sure you get enough calcium and vitamin D while you are taking this medicine, unless your doctor tells you not to. Discuss the foods you eat and the vitamins you take with your health care professional. °See your dentist regularly. Brush and floss your teeth as directed. Before you have any dental work done, tell your dentist you are receiving this medicine. °Do not become pregnant while taking this medicine or for 5 months   after stopping it. Talk with your doctor or health care professional about your birth control options while taking this medicine. Women should inform their doctor if they wish to become pregnant or think they might be pregnant. There is a potential for serious side effects to an unborn  child. Talk to your health care professional or pharmacist for more information. °What side effects may I notice from receiving this medicine? °Side effects that you should report to your doctor or health care professional as soon as possible: °-allergic reactions like skin rash, itching or hives, swelling of the face, lips, or tongue °-bone pain °-breathing problems °-dizziness °-jaw pain, especially after dental work °-redness, blistering, peeling of the skin °-signs and symptoms of infection like fever or chills; cough; sore throat; pain or trouble passing urine °-signs of low calcium like fast heartbeat, muscle cramps or muscle pain; pain, tingling, numbness in the hands or feet; seizures °-unusual bleeding or bruising °-unusually weak or tired °Side effects that usually do not require medical attention (report to your doctor or health care professional if they continue or are bothersome): °-constipation °-diarrhea °-headache °-joint pain °-loss of appetite °-muscle pain °-runny nose °-tiredness °-upset stomach °This list may not describe all possible side effects. Call your doctor for medical advice about side effects. You may report side effects to FDA at 1-800-FDA-1088. °Where should I keep my medicine? °This medicine is only given in a clinic, doctor's office, or other health care setting and will not be stored at home. °NOTE: This sheet is a summary. It may not cover all possible information. If you have questions about this medicine, talk to your doctor, pharmacist, or health care provider. °© 2018 [employer] (2016-03-01 19:17:21) ° °

## 2017-05-02 NOTE — Progress Notes (Signed)
Hematology and Oncology Follow Up Visit  Kimberly [name] [id number] [birth year] 77 y.o. 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  Oncotype score is 7  Current Therapy:    Femara 2.5 mg by mouth daily  Prolia 60 mg subcutaneous every 6 months - next dose in September 2019  S/p XRT - completed in May 2017 - 50.4 [name]     Interim History:  Kimberly [name] is back for follow-up. She really looks good.   The left mastectomy site has healed up nicely. She's had no problems with this. She does not have a see the plastic surgeon for any follow-up.  She's had no problems with fatigue or weakness. She's had no nausea or vomiting. She's had no cough or shortness of breath.  She is on Femara. She's not had any arthralgias with Femara. She does have some hot flashes. Off her some medication for this. She thinks that she can get through it herself.  Overall, her performance status is ECOG 1.   Medications:  Current Outpatient Medications:  .  acetaminophen (TYLENOL) 650 MG CR tablet, Take 1,300 mg by mouth. Take 2 pills in the PM, Disp: , Rfl:  .  Ascorbic Acid (VITAMIN C) 1000 MG tablet, Take 1,000 mg by mouth daily., Disp: , Rfl:  .  aspirin EC 81 MG tablet, Take 81 mg by mouth daily., Disp: , Rfl:  .  calcium carbonate (CALCIUM 600) 600 MG TABS tablet, Take 600 mg by mouth daily. , Disp: , Rfl:  .  Cholecalciferol (VITAMIN D) 2000 units CAPS, Take by mouth., Disp: , Rfl:  .  Glucosamine-Chondroit-Vit C-Mn (GLUCOSAMINE CHONDR 500 COMPLEX PO), Take by mouth., Disp: , Rfl:  .  letrozole (FEMARA) 2.5 MG tablet, Take 1 tablet (2.5 mg total) by mouth daily., Disp: 90 tablet, Rfl: 3 .  loratadine (CLARITIN) 10 MG tablet, Take 10 mg by mouth daily., Disp: , Rfl:  .  meclizine (ANTIVERT) 25 MG tablet, Take by mouth., Disp: , Rfl:  .  Multiple Vitamin (MULTI-VITAMINS) TABS, Take by mouth., Disp: , Rfl:  .  naproxen sodium (ANAPROX) 220 MG  tablet, Take 220 mg by mouth daily., Disp: , Rfl:  .  Pyridoxine HCl (VITAMIN B-6 PO), Take by mouth daily. , Disp: , Rfl:  .  Turmeric 500 MG TABS, Take by mouth daily. , Disp: , Rfl:   Allergies:  Allergies  Allergen Reactions  . Codeine Nausea And Vomiting  . Codeine Sulfate Other (See Comments)    Head feels funny.    Past Medical History, Surgical history, Social history, and Family History were reviewed and updated.  Review of Systems: As above  Physical Exam:  weight is 201 lb (91.2 kg). Her oral temperature is 97.4 F (36.3 C) (abnormal). Her blood pressure is 151/82 (abnormal) and her pulse is 81. Her respiration is 16 and oxygen saturation is 98%.   Wt Readings from Last 3 Encounters:  05/02/17 201 lb (91.2 kg)  11/01/16 215 lb (97.5 kg)  04/27/16 215 lb 1.9 oz (97.6 kg)     Head and neck exam shows no ocular or oral lesions. She has no palpable cervical or supraclavicular lymph nodes. Lungs are clear. Cardiac exam regular rate and rhythm with no murmurs, rubs or bruits. Breast exam shows right breast with no masses, edema or erythema. There is no right axillary adenopathy. Left chest wall shows a drainage tube in. She has a healing mastectomy  scar. No erythema is noted. No obvious fluid collection is noted. She has no palpable left axillary lymph node. Abdominal exam shows no fluid wave. There is no guarding or rebound tenderness. There is no abdominal mass. Back exam shows no tenderness over the spine, ribs or hips. Extremities shows no clubbing, cyanosis or edema. She has the left knee arthroplasty scar. This is well-healed. There is minimal swelling noted. She does have a little bit of erythema just below the left knee. No lymphedema is noted in the arms. Skin exam shows no rashes, ecchymoses or petechia. Neurological exam is nonfoca  Lab Results  Component Value Date   WBC 5.0 05/02/2017   HGB 14.3 11/01/2016   HCT 45.4 05/02/2017   MCV 91.0 05/02/2017   PLT 211  05/02/2017     Chemistry      Component Value Date/Time   NA 147 (H) 05/02/2017 0943   NA 143 11/01/2016 1030   NA 141 02/03/2016 1118   K 4.4 05/02/2017 0943   K 4.8 (H) 11/01/2016 1030   K 4.0 02/03/2016 1118   CL 106 05/02/2017 0943   CL 107 11/01/2016 1030   CO2 32 05/02/2017 0943   CO2 30 11/01/2016 1030   CO2 26 02/03/2016 1118   BUN 24 (H) 05/02/2017 0943   BUN 25 (H) 11/01/2016 1030   BUN 21.9 02/03/2016 1118   CREATININE 1.30 (H) 05/02/2017 0943   CREATININE 0.9 11/01/2016 1030   CREATININE 0.9 02/03/2016 1118      Component Value Date/Time   CALCIUM 10.1 05/02/2017 0943   CALCIUM 10.0 11/01/2016 1030   CALCIUM 10.0 02/03/2016 1118   ALKPHOS 73 05/02/2017 0943   ALKPHOS 82 11/01/2016 1030   ALKPHOS 99 02/03/2016 1118   AST 30 05/02/2017 0943   AST 32 02/03/2016 1118   ALT 33 05/02/2017 0943   ALT 29 11/01/2016 1030   ALT 27 02/03/2016 1118   BILITOT 0.6 05/02/2017 0943   BILITOT 0.36 02/03/2016 1118         Impression and Plan: Kimberly [name] is a 77 year old postmenopausal female with stage IIb ductal carcinoma of the left breast. She had one positive lymph node. She had a very low Oncotype score of 7, so I did not think that there will be a role for chemotherapy.  We will go ahead and plan to get her back to see [place] in 6 more months. At that point, she will get a dose of Prolia.   Volanda Napoleon, MD 3/12/201911:28 AM

## 2017-05-02 NOTE — Telephone Encounter (Addendum)
-----   Message from Volanda Napoleon, MD sent at 05/02/2017 12:28 PM EDT ----- Call - the urine looks ok!!  Laurey Arrow   Above message left on home VM. dph

## 2017-05-03 LAB — VITAMIN D 25 HYDROXY (VIT D DEFICIENCY, FRACTURES): VIT D 25 HYDROXY: 53.7 ng/mL (ref 30.0–100.0)

## 2017-05-23 ENCOUNTER — Encounter: Payer: Self-pay | Admitting: Hematology & Oncology

## 2017-08-30 ENCOUNTER — Other Ambulatory Visit: Payer: Self-pay | Admitting: Internal Medicine

## 2017-08-30 DIAGNOSIS — Z1231 Encounter for screening mammogram for malignant neoplasm of breast: Secondary | ICD-10-CM

## 2017-09-25 ENCOUNTER — Ambulatory Visit
Admission: RE | Admit: 2017-09-25 | Discharge: 2017-09-25 | Disposition: A | Discharge status: Home / Self Care | Payer: Medicare Other | Source: Ambulatory Visit | Attending: Internal Medicine | Admitting: Internal Medicine

## 2017-09-25 DIAGNOSIS — Z1231 Encounter for screening mammogram for malignant neoplasm of breast: Secondary | ICD-10-CM

## 2017-10-31 ENCOUNTER — Inpatient Hospital Stay: Payer: Medicare Other

## 2017-10-31 ENCOUNTER — Inpatient Hospital Stay: Payer: Medicare Other | Attending: Hematology & Oncology | Admitting: Hematology & Oncology

## 2017-10-31 DIAGNOSIS — C50912 Malignant neoplasm of unspecified site of left female breast: Secondary | ICD-10-CM | POA: Insufficient documentation

## 2017-10-31 DIAGNOSIS — M2550 Pain in unspecified joint: Secondary | ICD-10-CM | POA: Diagnosis not present

## 2017-10-31 DIAGNOSIS — M818 Other osteoporosis without current pathological fracture: Secondary | ICD-10-CM

## 2017-10-31 DIAGNOSIS — E559 Vitamin D deficiency, unspecified: Secondary | ICD-10-CM

## 2017-10-31 DIAGNOSIS — C50212 Malignant neoplasm of upper-inner quadrant of left female breast: Secondary | ICD-10-CM

## 2017-10-31 DIAGNOSIS — C50112 Malignant neoplasm of central portion of left female breast: Secondary | ICD-10-CM

## 2017-10-31 DIAGNOSIS — T386X5A Adverse effect of antigonadotrophins, antiestrogens, antiandrogens, not elsewhere classified, initial encounter: Secondary | ICD-10-CM

## 2017-10-31 DIAGNOSIS — N951 Menopausal and female climacteric states: Secondary | ICD-10-CM | POA: Diagnosis not present

## 2017-10-31 DIAGNOSIS — Z9012 Acquired absence of left breast and nipple: Secondary | ICD-10-CM | POA: Diagnosis not present

## 2017-10-31 DIAGNOSIS — Z170 Estrogen receptor positive status [ER+]: Secondary | ICD-10-CM | POA: Insufficient documentation

## 2017-10-31 DIAGNOSIS — Z79811 Long term (current) use of aromatase inhibitors: Secondary | ICD-10-CM | POA: Insufficient documentation

## 2017-10-31 DIAGNOSIS — R300 Dysuria: Secondary | ICD-10-CM

## 2017-10-31 LAB — CBC WITH DIFFERENTIAL (CANCER CENTER ONLY)
BASOS PCT: 2 %
Basophils Absolute: 0.1 10*3/uL (ref 0.0–0.1)
EOS ABS: 0.4 10*3/uL (ref 0.0–0.5)
Eosinophils Relative: 8 %
HCT: 45.8 % (ref 34.8–46.6)
HEMOGLOBIN: 14.5 g/dL (ref 11.6–15.9)
LYMPHS ABS: 1.1 10*3/uL (ref 0.9–3.3)
Lymphocytes Relative: 22 %
MCH: 28.9 pg (ref 26.0–34.0)
MCHC: 31.7 g/dL — AB (ref 32.0–36.0)
MCV: 91.4 fL (ref 81.0–101.0)
MONOS PCT: 12 %
Monocytes Absolute: 0.6 10*3/uL (ref 0.1–0.9)
NEUTROS ABS: 2.8 10*3/uL (ref 1.5–6.5)
NEUTROS PCT: 56 %
Platelet Count: 217 10*3/uL (ref 145–400)
RBC: 5.01 MIL/uL (ref 3.70–5.32)
RDW: 14.5 % (ref 11.1–15.7)
WBC Count: 4.9 10*3/uL (ref 3.9–10.0)

## 2017-10-31 LAB — CMP (CANCER CENTER ONLY)
ALBUMIN: 4.2 g/dL (ref 3.5–5.0)
ALK PHOS: 73 U/L (ref 26–84)
ALT: 27 U/L (ref 10–47)
AST: 31 U/L (ref 11–38)
Anion gap: 2 — ABNORMAL LOW (ref 5–15)
BILIRUBIN TOTAL: 0.7 mg/dL (ref 0.2–1.6)
BUN: 18 mg/dL (ref 7–22)
CHLORIDE: 107 mmol/L (ref 98–108)
CO2: 30 mmol/L (ref 18–33)
CREATININE: 0.8 mg/dL (ref 0.60–1.20)
Calcium: 9.8 mg/dL (ref 8.0–10.3)
Glucose, Bld: 94 mg/dL (ref 73–118)
Potassium: 4.3 mmol/L (ref 3.3–4.7)
SODIUM: 139 mmol/L (ref 128–145)
TOTAL PROTEIN: 7.2 g/dL (ref 6.4–8.1)

## 2017-10-31 MED ORDER — DENOSUMAB 60 MG/ML ~~LOC~~ SOSY
60.0000 mg | PREFILLED_SYRINGE | Freq: Once | SUBCUTANEOUS | Status: AC
  Administered 2017-10-31: 60 mg via SUBCUTANEOUS
  Filled 2017-10-31: qty 1

## 2017-10-31 MED ORDER — LETROZOLE 2.5 MG PO TABS: 2.5000 mg | ORAL_TABLET | Freq: Every day | ORAL | 3 refills | Status: DC

## 2017-10-31 MED ORDER — DENOSUMAB 60 MG/ML ~~LOC~~ SOSY
PREFILLED_SYRINGE | SUBCUTANEOUS | Status: AC
  Filled 2017-10-31: qty 1

## 2017-10-31 NOTE — Progress Notes (Signed)
Hematology and Oncology Follow Up Visit  Kimberly [name] [id number] 10/17/1940 77 y.o. 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  Oncotype score is 7  Current Therapy:    Femara 2.5 mg by mouth daily  Prolia 60 mg subcutaneous every 6 months - next dose in September 2019  S/p XRT - completed in May 2017 - 50.4 [name]     Interim History:  Kimberly [name] is back for follow-up. She really looks good.   The left mastectomy site has healed up nicely. She's had no problems with this. She does not have a see the plastic surgeon for any follow-up.  She's had no problems with fatigue or weakness. She's had no nausea or vomiting. She's had no cough or shortness of breath.  She is on Femara. She's not had any arthralgias with Femara. She does have some hot flashes.   She does have some arthritis issues.  Her doctor recommended CBD oil.  I have no problems with this.  I told her to make sure that her husband also takes some.  Overall, her performance status is ECOG 1.   Medications:  Current Outpatient Medications:  .  Coenzyme Q10-Vitamin E (QUNOL ULTRA COQ10 PO), Take 2 capsules by mouth daily., Disp: , Rfl:  .  acetaminophen (TYLENOL) 650 MG CR tablet, Take 1,300 mg by mouth. Take 2 pills in the PM, Disp: , Rfl:  .  Ascorbic Acid (VITAMIN C) 1000 MG tablet, Take 1,000 mg by mouth daily., Disp: , Rfl:  .  aspirin EC 81 MG tablet, Take 81 mg by mouth daily., Disp: , Rfl:  .  calcium carbonate (CALCIUM 600) 600 MG TABS tablet, Take 600 mg by mouth daily. , Disp: , Rfl:  .  Cholecalciferol (VITAMIN D) 2000 units CAPS, Take by mouth., Disp: , Rfl:  .  Glucosamine-Chondroit-Vit C-Mn (GLUCOSAMINE CHONDR 500 COMPLEX PO), Take by mouth., Disp: , Rfl:  .  letrozole (FEMARA) 2.5 MG tablet, Take 1 tablet (2.5 mg total) by mouth daily., Disp: 90 tablet, Rfl: 3 .  loratadine (CLARITIN) 10 MG tablet, Take 10 mg by mouth daily., Disp: , Rfl:    .  meclizine (ANTIVERT) 25 MG tablet, Take by mouth., Disp: , Rfl:  .  Multiple Vitamin (MULTI-VITAMINS) TABS, Take by mouth., Disp: , Rfl:  .  naproxen sodium (ANAPROX) 220 MG tablet, Take 220 mg by mouth daily., Disp: , Rfl:  .  Pyridoxine HCl (VITAMIN B-6 PO), Take by mouth daily. , Disp: , Rfl:  No current facility-administered medications for this visit.   Facility-Administered Medications Ordered in Other Visits:  .  denosumab (PROLIA) injection 60 mg, 60 mg, Subcutaneous, Once, Sherman Lipuma, Rudell Cobb, MD  Allergies:  Allergies  Allergen Reactions  . Codeine Nausea And Vomiting  . Codeine Sulfate Other (See Comments)    Head feels funny.    Past Medical History, Surgical history, Social history, and Family History were reviewed and updated.  Review of Systems: Review of Systems  Constitutional: Negative.   HENT: Negative.   Eyes: Negative.   Respiratory: Negative.   Cardiovascular: Negative.   Gastrointestinal: Negative.   Genitourinary: Negative.   Musculoskeletal: Positive for joint pain.  Skin: Negative.   Neurological: Negative.   Endo/Heme/Allergies: Negative.   Psychiatric/Behavioral: Negative.      Physical Exam:  weight is 214 lb (97.1 kg). Her oral temperature is 98.2 F (36.8 C). Her blood pressure is 112/67 and her pulse  is 65. Her respiration is 17.   Wt Readings from Last 3 Encounters:  10/31/17 214 lb (97.1 kg)  05/02/17 201 lb (91.2 kg)  11/01/16 215 lb (97.5 kg)     Physical Exam  Constitutional: She is oriented to person, place, and time.  Breast exam shows right breast but no masses, edema or erythema.  There is no right axillary adenopathy.  Left chest wall shows well-healed mastectomy.  There is no chest wall nodules or erythema.  There is no tenderness.  She has no left axillary adenopathy.  HENT:  Head: Normocephalic and atraumatic.  Mouth/Throat: Oropharynx is clear and moist.  Eyes: Pupils are equal, round, and reactive to light. EOM are  normal.  Neck: Normal range of motion.  Cardiovascular: Normal rate, regular rhythm and normal heart sounds.  Pulmonary/Chest: Effort normal and breath sounds normal.  Abdominal: Soft. Bowel sounds are normal.  Musculoskeletal: Normal range of motion. She exhibits no edema, tenderness or deformity.  Lymphadenopathy:    She has no cervical adenopathy.  Neurological: She is alert and oriented to person, place, and time.  Skin: Skin is warm and dry. No rash noted. No erythema.  Psychiatric: She has a normal mood and affect. Her behavior is normal. Judgment and thought content normal.  Vitals reviewed.    Lab Results  Component Value Date   WBC 4.9 10/31/2017   HGB 14.5 10/31/2017   HCT 45.8 10/31/2017   MCV 91.4 10/31/2017   PLT 217 10/31/2017     Chemistry      Component Value Date/Time   NA 139 10/31/2017 0947   NA 143 11/01/2016 1030   NA 141 02/03/2016 1118   K 4.3 10/31/2017 0947   K 4.8 (H) 11/01/2016 1030   K 4.0 02/03/2016 1118   CL 107 10/31/2017 0947   CL 107 11/01/2016 1030   CO2 30 10/31/2017 0947   CO2 30 11/01/2016 1030   CO2 26 02/03/2016 1118   BUN 18 10/31/2017 0947   BUN 25 (H) 11/01/2016 1030   BUN 21.9 02/03/2016 1118   CREATININE 0.80 10/31/2017 0947   CREATININE 0.9 11/01/2016 1030   CREATININE 0.9 02/03/2016 1118      Component Value Date/Time   CALCIUM 9.8 10/31/2017 0947   CALCIUM 10.0 11/01/2016 1030   CALCIUM 10.0 02/03/2016 1118   ALKPHOS 73 10/31/2017 0947   ALKPHOS 82 11/01/2016 1030   ALKPHOS 99 02/03/2016 1118   AST 31 10/31/2017 0947   AST 32 02/03/2016 1118   ALT 27 10/31/2017 0947   ALT 29 11/01/2016 1030   ALT 27 02/03/2016 1118   BILITOT 0.7 10/31/2017 0947   BILITOT 0.36 02/03/2016 1118         Impression and Plan: Kimberly [name] is a 77 year old postmenopausal female with stage IIb ductal carcinoma of the left breast. She had one positive lymph node. She had a very low Oncotype score of 7, so I did not think that there  will be a role for chemotherapy.  We will go ahead and plan to get her back to see [place] in 6 more months.   At that point, she will get a dose of Prolia.   Volanda Napoleon, MD 9/10/201912:00 PM

## 2017-11-01 LAB — CANCER ANTIGEN 27.29: CAN 27.29: 44.4 U/mL — AB (ref 0.0–38.6)

## 2017-11-01 LAB — VITAMIN D 25 HYDROXY (VIT D DEFICIENCY, FRACTURES): VIT D 25 HYDROXY: 53.5 ng/mL (ref 30.0–100.0)

## 2017-11-09 ENCOUNTER — Other Ambulatory Visit: Payer: Self-pay | Admitting: Hematology & Oncology

## 2017-11-09 DIAGNOSIS — C50012 Malignant neoplasm of nipple and areola, left female breast: Secondary | ICD-10-CM

## 2017-11-28 ENCOUNTER — Ambulatory Visit (HOSPITAL_COMMUNITY)
Admission: RE | Admit: 2017-11-28 | Discharge: 2017-11-28 | Disposition: A | Discharge status: Home / Self Care | Payer: Medicare Other | Source: Ambulatory Visit | Attending: Hematology & Oncology | Admitting: Hematology & Oncology

## 2017-11-28 DIAGNOSIS — C50012 Malignant neoplasm of nipple and areola, left female breast: Secondary | ICD-10-CM | POA: Diagnosis not present

## 2017-11-28 LAB — GLUCOSE, CAPILLARY: GLUCOSE-CAPILLARY: 85 mg/dL (ref 70–99)

## 2017-11-28 MED ORDER — FLUDEOXYGLUCOSE F - 18 (FDG) INJECTION
10.6600 | Freq: Once | INTRAVENOUS | Status: AC
  Administered 2017-11-28: 10.66 via INTRAVENOUS

## 2018-03-27 ENCOUNTER — Other Ambulatory Visit: Payer: Self-pay | Admitting: *Deleted

## 2018-03-27 DIAGNOSIS — C50112 Malignant neoplasm of central portion of left female breast: Secondary | ICD-10-CM

## 2018-03-27 DIAGNOSIS — T386X5A Adverse effect of antigonadotrophins, antiestrogens, antiandrogens, not elsewhere classified, initial encounter: Secondary | ICD-10-CM

## 2018-03-27 DIAGNOSIS — E559 Vitamin D deficiency, unspecified: Secondary | ICD-10-CM

## 2018-03-27 DIAGNOSIS — M818 Other osteoporosis without current pathological fracture: Secondary | ICD-10-CM

## 2018-03-27 MED ORDER — LETROZOLE 2.5 MG PO TABS: 2.5000 mg | ORAL_TABLET | Freq: Every day | ORAL | 3 refills | Status: DC

## 2018-05-08 ENCOUNTER — Inpatient Hospital Stay: Payer: Medicare Other

## 2018-05-08 ENCOUNTER — Other Ambulatory Visit: Payer: Self-pay

## 2018-05-08 ENCOUNTER — Encounter: Payer: Self-pay | Admitting: Hematology & Oncology

## 2018-05-08 ENCOUNTER — Inpatient Hospital Stay: Payer: Medicare Other | Attending: Hematology & Oncology | Admitting: Hematology & Oncology

## 2018-05-08 VITALS — BP 118/68 | HR 69 | Temp 98.0°F | Resp 18 | Wt 216.0 lb

## 2018-05-08 DIAGNOSIS — T386X5A Adverse effect of antigonadotrophins, antiestrogens, antiandrogens, not elsewhere classified, initial encounter: Secondary | ICD-10-CM

## 2018-05-08 DIAGNOSIS — C50012 Malignant neoplasm of nipple and areola, left female breast: Secondary | ICD-10-CM

## 2018-05-08 DIAGNOSIS — C50212 Malignant neoplasm of upper-inner quadrant of left female breast: Secondary | ICD-10-CM

## 2018-05-08 DIAGNOSIS — Z79811 Long term (current) use of aromatase inhibitors: Secondary | ICD-10-CM | POA: Insufficient documentation

## 2018-05-08 DIAGNOSIS — C779 Secondary and unspecified malignant neoplasm of lymph node, unspecified: Secondary | ICD-10-CM

## 2018-05-08 DIAGNOSIS — C50912 Malignant neoplasm of unspecified site of left female breast: Secondary | ICD-10-CM | POA: Insufficient documentation

## 2018-05-08 DIAGNOSIS — C50112 Malignant neoplasm of central portion of left female breast: Secondary | ICD-10-CM

## 2018-05-08 DIAGNOSIS — M818 Other osteoporosis without current pathological fracture: Secondary | ICD-10-CM

## 2018-05-08 DIAGNOSIS — E559 Vitamin D deficiency, unspecified: Secondary | ICD-10-CM

## 2018-05-08 LAB — CBC WITH DIFFERENTIAL (CANCER CENTER ONLY)
ABS IMMATURE GRANULOCYTES: 0.01 10*3/uL (ref 0.00–0.07)
BASOS PCT: 2 %
Basophils Absolute: 0.1 10*3/uL (ref 0.0–0.1)
EOS PCT: 10 %
Eosinophils Absolute: 0.4 10*3/uL (ref 0.0–0.5)
HCT: 47 % — ABNORMAL HIGH (ref 36.0–46.0)
Hemoglobin: 14.8 g/dL (ref 12.0–15.0)
Immature Granulocytes: 0 %
Lymphocytes Relative: 28 %
Lymphs Abs: 1.2 10*3/uL (ref 0.7–4.0)
MCH: 29 pg (ref 26.0–34.0)
MCHC: 31.5 g/dL (ref 30.0–36.0)
MCV: 92.2 fL (ref 80.0–100.0)
Monocytes Absolute: 0.6 10*3/uL (ref 0.1–1.0)
Monocytes Relative: 14 %
NRBC: 0 % (ref 0.0–0.2)
Neutro Abs: 2 10*3/uL (ref 1.7–7.7)
Neutrophils Relative %: 46 %
PLATELETS: 217 10*3/uL (ref 150–400)
RBC: 5.1 MIL/uL (ref 3.87–5.11)
RDW: 14.1 % (ref 11.5–15.5)
WBC: 4.4 10*3/uL (ref 4.0–10.5)

## 2018-05-08 LAB — CMP (CANCER CENTER ONLY)
ALT: 24 U/L (ref 0–44)
ANION GAP: 8 (ref 5–15)
AST: 32 U/L (ref 15–41)
Albumin: 4.7 g/dL (ref 3.5–5.0)
Alkaline Phosphatase: 59 U/L (ref 38–126)
BUN: 21 mg/dL (ref 8–23)
CHLORIDE: 106 mmol/L (ref 98–111)
CO2: 31 mmol/L (ref 22–32)
Calcium: 10.7 mg/dL — ABNORMAL HIGH (ref 8.9–10.3)
Creatinine: 1.05 mg/dL — ABNORMAL HIGH (ref 0.44–1.00)
GFR, Estimated: 52 mL/min — ABNORMAL LOW (ref >60)
Glucose, Bld: 86 mg/dL (ref 70–99)
Potassium: 5.3 mmol/L — ABNORMAL HIGH (ref 3.5–5.1)
SODIUM: 145 mmol/L (ref 135–145)
Total Bilirubin: 0.5 mg/dL (ref 0.3–1.2)
Total Protein: 7.4 g/dL (ref 6.5–8.1)

## 2018-05-08 MED ORDER — DENOSUMAB 60 MG/ML ~~LOC~~ SOSY
60.0000 mg | PREFILLED_SYRINGE | Freq: Once | SUBCUTANEOUS | Status: AC
  Administered 2018-05-08: 60 mg via SUBCUTANEOUS
  Filled 2018-05-08: qty 1

## 2018-05-08 MED ORDER — DENOSUMAB 60 MG/ML ~~LOC~~ SOSY
PREFILLED_SYRINGE | SUBCUTANEOUS | Status: AC
  Filled 2018-05-08: qty 1

## 2018-05-08 NOTE — Addendum Note (Signed)
Addended by: Burney Gauze R on: 05/08/2018 09:50 AM   Modules accepted: Orders

## 2018-05-08 NOTE — Patient Instructions (Signed)

## 2018-05-08 NOTE — Progress Notes (Signed)
Hematology and Oncology Follow Up Visit  Kimberly [name] [id number] 1941-01-13 78 y.o. 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  Oncotype score is 7  Current Therapy:    Femara 2.5 mg by mouth daily  Prolia 60 mg subcutaneous every 6 months - next dose in September 2020  S/p XRT - completed in May 2017 - 50.4 [name]     Interim History:  Kimberly [name] is back for follow-up. She really looks good.  Looks so far, everything is going quite well for her.  She actually is doing some water aerobics.  I think this is a great idea for her.  I told her that she is okay for doing this.  I told her that there is no risk of her to get the coronavirus.  She has had no problems with fever.  She has had no bleeding.  There is been no change in bowel or bladder habits.  She has had no rashes.  There is been no leg swelling.  She gets her Prolia today.  Status is ECOG 1.   Medications:  Current Outpatient Medications:  .  acetaminophen (TYLENOL) 650 MG CR tablet, Take 1,300 mg by mouth. Take 2 pills in the PM, Disp: , Rfl:  .  Ascorbic Acid (VITAMIN C) 1000 MG tablet, Take 1,000 mg by mouth daily., Disp: , Rfl:  .  aspirin EC 81 MG tablet, Take 81 mg by mouth daily., Disp: , Rfl:  .  calcium carbonate (CALCIUM 600) 600 MG TABS tablet, Take 600 mg by mouth daily. , Disp: , Rfl:  .  Cholecalciferol (VITAMIN D) 2000 units CAPS, Take by mouth., Disp: , Rfl:  .  Coenzyme Q10-Vitamin E (QUNOL ULTRA COQ10 PO), Take 2 capsules by mouth daily., Disp: , Rfl:  .  Glucosamine-Chondroit-Vit C-Mn (GLUCOSAMINE CHONDR 500 COMPLEX PO), Take by mouth., Disp: , Rfl:  .  letrozole (FEMARA) 2.5 MG tablet, Take 1 tablet (2.5 mg total) by mouth daily., Disp: 90 tablet, Rfl: 3 .  loratadine (CLARITIN) 10 MG tablet, Take 10 mg by mouth daily., Disp: , Rfl:  .  meclizine (ANTIVERT) 25 MG tablet, Take by mouth., Disp: , Rfl:  .  Multiple Vitamin (MULTI-VITAMINS)  TABS, Take by mouth., Disp: , Rfl:  .  naproxen sodium (ANAPROX) 220 MG tablet, Take 220 mg by mouth daily., Disp: , Rfl:  .  Pyridoxine HCl (VITAMIN B-6 PO), Take by mouth daily. , Disp: , Rfl:   Allergies:  Allergies  Allergen Reactions  . Codeine Nausea And Vomiting and Other (See Comments)    Head feels funny.  . Codeine Sulfate Other (See Comments)    Head feels funny.    Past Medical History, Surgical history, Social history, and Family History were reviewed and updated.  Review of Systems: Review of Systems  Constitutional: Negative.   HENT: Negative.   Eyes: Negative.   Respiratory: Negative.   Cardiovascular: Negative.   Gastrointestinal: Negative.   Genitourinary: Negative.   Musculoskeletal: Positive for joint pain.  Skin: Negative.   Neurological: Negative.   Endo/Heme/Allergies: Negative.   Psychiatric/Behavioral: Negative.      Physical Exam:  weight is 216 lb (98 kg). Her oral temperature is 98 F (36.7 C). Her blood pressure is 118/68 and her pulse is 69. Her respiration is 18 and oxygen saturation is 100%.   Wt Readings from Last 3 Encounters:  05/08/18 216 lb (98 kg)  10/31/17 214 lb (  97.1 kg)  05/02/17 201 lb (91.2 kg)     Physical Exam Vitals signs reviewed.  Constitutional:      Comments: Breast exam shows right breast but no masses, edema or erythema.  There is no right axillary adenopathy.  Left chest wall shows well-healed mastectomy.  There is no chest wall nodules or erythema.  There is no tenderness.  She has no left axillary adenopathy.  HENT:     Head: Normocephalic and atraumatic.  Eyes:     Pupils: Pupils are equal, round, and reactive to light.  Neck:     Musculoskeletal: Normal range of motion.  Cardiovascular:     Rate and Rhythm: Normal rate and regular rhythm.     Heart sounds: Normal heart sounds.  Pulmonary:     Effort: Pulmonary effort is normal.     Breath sounds: Normal breath sounds.  Abdominal:     General: Bowel  sounds are normal.     Palpations: Abdomen is soft.  Musculoskeletal: Normal range of motion.        General: No tenderness or deformity.  Lymphadenopathy:     Cervical: No cervical adenopathy.  Skin:    General: Skin is warm and dry.     Findings: No erythema or rash.  Neurological:     Mental Status: She is alert and oriented to person, place, and time.  Psychiatric:        Behavior: Behavior normal.        Thought Content: Thought content normal.        Judgment: Judgment normal.      Lab Results  Component Value Date   WBC 4.4 05/08/2018   HGB 14.8 05/08/2018   HCT 47.0 (H) 05/08/2018   MCV 92.2 05/08/2018   PLT 217 05/08/2018     Chemistry      Component Value Date/Time   NA 145 05/08/2018 0831   NA 143 11/01/2016 1030   NA 141 02/03/2016 1118   K 5.3 (H) 05/08/2018 0831   K 4.8 (H) 11/01/2016 1030   K 4.0 02/03/2016 1118   CL 106 05/08/2018 0831   CL 107 11/01/2016 1030   CO2 31 05/08/2018 0831   CO2 30 11/01/2016 1030   CO2 26 02/03/2016 1118   BUN 21 05/08/2018 0831   BUN 25 (H) 11/01/2016 1030   BUN 21.9 02/03/2016 1118   CREATININE 1.05 (H) 05/08/2018 0831   CREATININE 0.9 11/01/2016 1030   CREATININE 0.9 02/03/2016 1118      Component Value Date/Time   CALCIUM 10.7 (H) 05/08/2018 0831   CALCIUM 10.0 11/01/2016 1030   CALCIUM 10.0 02/03/2016 1118   ALKPHOS 59 05/08/2018 0831   ALKPHOS 82 11/01/2016 1030   ALKPHOS 99 02/03/2016 1118   AST 32 05/08/2018 0831   AST 32 02/03/2016 1118   ALT 24 05/08/2018 0831   ALT 29 11/01/2016 1030   ALT 27 02/03/2016 1118   BILITOT 0.5 05/08/2018 0831   BILITOT 0.36 02/03/2016 1118         Impression and Plan: Kimberly [name] is a 78 year old postmenopausal female with stage IIb ductal carcinoma of the left breast. She had one positive lymph node. She had a very low Oncotype score of 7, so I did not think that there will be a role for chemotherapy.  We will go ahead and plan to get her back to see [place] in 6  more months.   At that point, she will get a dose of Prolia.  Volanda Napoleon, MD 3/17/20209:34 AM

## 2018-05-09 ENCOUNTER — Telehealth: Payer: Self-pay | Admitting: *Deleted

## 2018-05-09 LAB — CANCER ANTIGEN 27.29: CA 27.29: 44.3 U/mL — ABNORMAL HIGH (ref 0.0–38.6)

## 2018-05-09 LAB — VITAMIN D 25 HYDROXY (VIT D DEFICIENCY, FRACTURES): Vit D, 25-Hydroxy: 52.6 ng/mL (ref 30.0–100.0)

## 2018-05-09 NOTE — Telephone Encounter (Signed)
Per MD, notified pt of lab results. Pt verbalized understanding, no further concerns at this time.

## 2018-05-09 NOTE — Telephone Encounter (Signed)
-----   Message from Volanda Napoleon, MD sent at 05/09/2018  9:26 AM EDT ----- Call - the tumor level is exactly the same as it was 6 months ago -- 53.  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  I think you will always have a high CA27.29.  Kimberly [name]

## 2018-08-21 ENCOUNTER — Other Ambulatory Visit: Payer: Self-pay | Admitting: Internal Medicine

## 2018-08-21 DIAGNOSIS — Z1231 Encounter for screening mammogram for malignant neoplasm of breast: Secondary | ICD-10-CM

## 2018-10-01 ENCOUNTER — Other Ambulatory Visit: Payer: Self-pay

## 2018-10-01 ENCOUNTER — Ambulatory Visit
Admission: RE | Admit: 2018-10-01 | Discharge: 2018-10-01 | Disposition: A | Discharge status: Home / Self Care | Payer: Medicare Other | Source: Ambulatory Visit | Attending: Internal Medicine | Admitting: Internal Medicine

## 2018-10-01 DIAGNOSIS — Z1231 Encounter for screening mammogram for malignant neoplasm of breast: Secondary | ICD-10-CM

## 2018-10-02 ENCOUNTER — Encounter: Payer: Self-pay | Admitting: *Deleted

## 2018-11-06 ENCOUNTER — Inpatient Hospital Stay: Payer: Medicare Other

## 2018-11-06 ENCOUNTER — Inpatient Hospital Stay: Payer: Medicare Other | Attending: Hematology & Oncology | Admitting: Hematology & Oncology

## 2018-11-06 ENCOUNTER — Encounter: Payer: Self-pay | Admitting: Hematology & Oncology

## 2018-11-06 ENCOUNTER — Other Ambulatory Visit: Payer: Self-pay

## 2018-11-06 VITALS — BP 126/71 | HR 59 | Temp 97.8°F | Resp 20 | Wt 211.0 lb

## 2018-11-06 DIAGNOSIS — C50912 Malignant neoplasm of unspecified site of left female breast: Secondary | ICD-10-CM | POA: Diagnosis not present

## 2018-11-06 DIAGNOSIS — Z79811 Long term (current) use of aromatase inhibitors: Secondary | ICD-10-CM | POA: Insufficient documentation

## 2018-11-06 DIAGNOSIS — C779 Secondary and unspecified malignant neoplasm of lymph node, unspecified: Secondary | ICD-10-CM | POA: Diagnosis not present

## 2018-11-06 DIAGNOSIS — Z170 Estrogen receptor positive status [ER+]: Secondary | ICD-10-CM | POA: Diagnosis not present

## 2018-11-06 DIAGNOSIS — C50112 Malignant neoplasm of central portion of left female breast: Secondary | ICD-10-CM

## 2018-11-06 DIAGNOSIS — C50212 Malignant neoplasm of upper-inner quadrant of left female breast: Secondary | ICD-10-CM

## 2018-11-06 DIAGNOSIS — C50012 Malignant neoplasm of nipple and areola, left female breast: Secondary | ICD-10-CM

## 2018-11-06 DIAGNOSIS — M818 Other osteoporosis without current pathological fracture: Secondary | ICD-10-CM

## 2018-11-06 LAB — CBC WITH DIFFERENTIAL (CANCER CENTER ONLY)
Abs Immature Granulocytes: 0.01 10*3/uL (ref 0.00–0.07)
Basophils Absolute: 0.1 10*3/uL (ref 0.0–0.1)
Basophils Relative: 1 %
Eosinophils Absolute: 0.5 10*3/uL (ref 0.0–0.5)
Eosinophils Relative: 9 %
HCT: 43.9 % (ref 36.0–46.0)
Hemoglobin: 13.9 g/dL (ref 12.0–15.0)
Immature Granulocytes: 0 %
Lymphocytes Relative: 21 %
Lymphs Abs: 1.2 10*3/uL (ref 0.7–4.0)
MCH: 29.4 pg (ref 26.0–34.0)
MCHC: 31.7 g/dL (ref 30.0–36.0)
MCV: 92.8 fL (ref 80.0–100.0)
Monocytes Absolute: 0.8 10*3/uL (ref 0.1–1.0)
Monocytes Relative: 13 %
Neutro Abs: 3.3 10*3/uL (ref 1.7–7.7)
Neutrophils Relative %: 56 %
Platelet Count: 207 10*3/uL (ref 150–400)
RBC: 4.73 MIL/uL (ref 3.87–5.11)
RDW: 13.9 % (ref 11.5–15.5)
WBC Count: 5.9 10*3/uL (ref 4.0–10.5)
nRBC: 0 % (ref 0.0–0.2)

## 2018-11-06 LAB — CMP (CANCER CENTER ONLY)
ALT: 23 U/L (ref 0–44)
AST: 28 U/L (ref 15–41)
Albumin: 4.2 g/dL (ref 3.5–5.0)
Alkaline Phosphatase: 65 U/L (ref 38–126)
Anion gap: 6 (ref 5–15)
BUN: 19 mg/dL (ref 8–23)
CO2: 30 mmol/L (ref 22–32)
Calcium: 9.8 mg/dL (ref 8.9–10.3)
Chloride: 108 mmol/L (ref 98–111)
Creatinine: 0.88 mg/dL (ref 0.44–1.00)
GFR, Est AFR Am: >60 mL/min (ref >60)
GFR, Estimated: >60 mL/min (ref >60)
Glucose, Bld: 97 mg/dL (ref 70–99)
Potassium: 4.8 mmol/L (ref 3.5–5.1)
Sodium: 144 mmol/L (ref 135–145)
Total Bilirubin: 0.5 mg/dL (ref 0.3–1.2)
Total Protein: 6.9 g/dL (ref 6.5–8.1)

## 2018-11-06 MED ORDER — DENOSUMAB 60 MG/ML ~~LOC~~ SOSY
60.0000 mg | PREFILLED_SYRINGE | Freq: Once | SUBCUTANEOUS | Status: AC
  Administered 2018-11-06: 60 mg via SUBCUTANEOUS
  Filled 2018-11-06: qty 1

## 2018-11-06 NOTE — Patient Instructions (Signed)
Denosumab injection What is this medicine? DENOSUMAB (den oh sue mab) slows bone breakdown. Prolia is used to treat osteoporosis in women after menopause and in men, and in people who are taking corticosteroids for 6 months or more. Xgeva is used to treat a high calcium level due to cancer and to prevent bone fractures and other bone problems caused by multiple myeloma or cancer bone metastases. Xgeva is also used to treat giant cell tumor of the bone. This medicine may be used for other purposes; ask your health care provider or pharmacist if you have questions. COMMON BRAND NAME(S): Prolia, XGEVA What should I tell my health care provider before I take this medicine? They need to know if you have any of these conditions:  dental disease  having surgery or tooth extraction  infection  kidney disease  low levels of calcium or Vitamin D in the blood  malnutrition  on hemodialysis  skin conditions or sensitivity  thyroid or parathyroid disease  an unusual reaction to denosumab, other medicines, foods, dyes, or preservatives  pregnant or trying to get pregnant  breast-feeding How should I use this medicine? This medicine is for injection under the skin. It is given by a health care professional in a hospital or clinic setting. A special MedGuide will be given to you before each treatment. Be sure to read this information carefully each time. For Prolia, talk to your pediatrician regarding the use of this medicine in children. Special care may be needed. For Xgeva, talk to your pediatrician regarding the use of this medicine in children. While this drug may be prescribed for children as young as 13 years for selected conditions, precautions do apply. Overdosage: If you think you have taken too much of this medicine contact a poison control center or emergency room at once. NOTE: This medicine is only for you. Do not share this medicine with others. What if I miss a dose? It is  important not to miss your dose. Call your doctor or health care professional if you are unable to keep an appointment. What may interact with this medicine? Do not take this medicine with any of the following medications:  other medicines containing denosumab This medicine may also interact with the following medications:  medicines that lower your chance of fighting infection  steroid medicines like prednisone or cortisone This list may not describe all possible interactions. Give your health care provider a list of all the medicines, herbs, non-prescription drugs, or dietary supplements you use. Also tell them if you smoke, drink alcohol, or use illegal drugs. Some items may interact with your medicine. What should I watch for while using this medicine? Visit your doctor or health care professional for regular checks on your progress. Your doctor or health care professional may order blood tests and other tests to see how you are doing. Call your doctor or health care professional for advice if you get a fever, chills or sore throat, or other symptoms of a cold or flu. Do not treat yourself. This drug may decrease your body's ability to fight infection. Try to avoid being around people who are sick. You should make sure you get enough calcium and vitamin D while you are taking this medicine, unless your doctor tells you not to. Discuss the foods you eat and the vitamins you take with your health care professional. See your dentist regularly. Brush and floss your teeth as directed. Before you have any dental work done, tell your dentist you are   receiving this medicine. Do not become pregnant while taking this medicine or for 5 months after stopping it. Talk with your doctor or health care professional about your birth control options while taking this medicine. Women should inform their doctor if they wish to become pregnant or think they might be pregnant. There is a potential for serious side  effects to an unborn child. Talk to your health care professional or pharmacist for more information. What side effects may I notice from receiving this medicine? Side effects that you should report to your doctor or health care professional as soon as possible:  allergic reactions like skin rash, itching or hives, swelling of the face, lips, or tongue  bone pain  breathing problems  dizziness  jaw pain, especially after dental work  redness, blistering, peeling of the skin  signs and symptoms of infection like fever or chills; cough; sore throat; pain or trouble passing urine  signs of low calcium like fast heartbeat, muscle cramps or muscle pain; pain, tingling, numbness in the hands or feet; seizures  unusual bleeding or bruising  unusually weak or tired Side effects that usually do not require medical attention (report to your doctor or health care professional if they continue or are bothersome):  constipation  diarrhea  headache  joint pain  loss of appetite  muscle pain  runny nose  tiredness  upset stomach This list may not describe all possible side effects. Call your doctor for medical advice about side effects. You may report side effects to FDA at 1-800-FDA-1088. Where should I keep my medicine? This medicine is only given in a clinic, doctor's office, or other health care setting and will not be stored at home. NOTE: This sheet is a summary. It may not cover all possible information. If you have questions about this medicine, talk to your doctor, pharmacist, or health care provider.  2020 [employer]/Gold Standard (2017-06-16 16:10:44)

## 2018-11-06 NOTE — Progress Notes (Signed)
Hematology and Oncology Follow Up Visit  Kimberly [name] [id number] 1940-03-19 78 y.o. 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  Oncotype score is 7  Current Therapy:    Femara 2.5 mg by mouth daily -- complete 5 yrs in 06/2020  Prolia 60 mg subcutaneous every 6 months - next dose in March 2021  S/p XRT - completed in May 2017 - 50.4 [name]     Interim History:  Kimberly [name] is back for follow-up. She really looks good.  Looks so far, everything is going quite well for her.  She and her husband had their 50th wedding anniversary back in June.  Unfortunately, because of the coronavirus, they really cannot celebrate the way they would like.  She is having no real issues.  She is worried about having "emphysema.".  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  I told her that from my point of view, emphysema is a clinical diagnosis and she needs abnormal PFTs to make the diagnosis.  I think that the "emphysema" that they see might be residual from that radiation that she had for her breast cancer.  She has had no shortness of breath.  There is no cough.  She is never smoked.    She gets her Prolia today.    Overall, her performance status is ECOG 1.   Medications:  Current Outpatient Medications:  .  acetaminophen (TYLENOL) 650 MG CR tablet, Take 1,300 mg by mouth. Take 2 pills in the PM, Disp: , Rfl:  .  Ascorbic Acid (VITAMIN C) 1000 MG tablet, Take 1,000 mg by mouth daily., Disp: , Rfl:  .  aspirin EC 81 MG tablet, Take 81 mg by mouth daily., Disp: , Rfl:  .  calcium carbonate (CALCIUM 600) 600 MG TABS tablet, Take 600 mg by mouth daily. , Disp: , Rfl:  .  Cholecalciferol (VITAMIN D) 2000 units CAPS, Take by mouth., Disp: , Rfl:  .  Coenzyme Q10-Vitamin E (QUNOL ULTRA COQ10 PO), Take 2 capsules by mouth daily., Disp: , Rfl:  .  Glucosamine-Chondroit-Vit C-Mn (GLUCOSAMINE CHONDR 500 COMPLEX  PO), Take by mouth., Disp: , Rfl:  .  letrozole (FEMARA) 2.5 MG tablet, Take 1 tablet (2.5 mg total) by mouth daily., Disp: 90 tablet, Rfl: 3 .  loratadine (CLARITIN) 10 MG tablet, Take 10 mg by mouth daily., Disp: , Rfl:  .  meclizine (ANTIVERT) 25 MG tablet, Take by mouth 2 (two) times daily as needed. , Disp: , Rfl:  .  Multiple Vitamin (MULTI-VITAMINS) TABS, Take by mouth., Disp: , Rfl:  .  naproxen sodium (ANAPROX) 220 MG tablet, Take 220 mg by mouth daily., Disp: , Rfl:  .  Pyridoxine HCl (VITAMIN B-6 PO), Take by mouth daily. , Disp: , Rfl:   Allergies:  Allergies  Allergen Reactions  . Codeine Nausea And Vomiting and Other (See Comments)    Head feels funny.  . Codeine Sulfate Other (See Comments)    Head feels funny.    Past Medical History, Surgical history, Social history, and Family History were reviewed and updated.  Review of Systems: Review of Systems  Constitutional: Negative.   HENT: Negative.   Eyes: Negative.   Respiratory: Negative.   Cardiovascular: Negative.   Gastrointestinal: Negative.   Genitourinary: Negative.   Musculoskeletal: Positive for joint pain.  Skin: Negative.   Neurological: Negative.   Endo/Heme/Allergies: Negative.   Psychiatric/Behavioral: Negative.  Physical Exam:  weight is 211 lb (95.7 kg). Her oral temperature is 97.8 F (36.6 C). Her blood pressure is 126/71 and her pulse is 59 (abnormal). Her respiration is 20 and oxygen saturation is 100%.   Wt Readings from Last 3 Encounters:  11/06/18 211 lb (95.7 kg)  05/08/18 216 lb (98 kg)  10/31/17 214 lb (97.1 kg)     Physical Exam Vitals signs reviewed.  Constitutional:      Comments: Breast exam shows right breast but no masses, edema or erythema.  There is no right axillary adenopathy.  Left chest wall shows well-healed mastectomy.  There is no chest wall nodules or erythema.  There is no tenderness.  She has no left axillary adenopathy.  HENT:     Head: Normocephalic and  atraumatic.  Eyes:     Pupils: Pupils are equal, round, and reactive to light.  Neck:     Musculoskeletal: Normal range of motion.  Cardiovascular:     Rate and Rhythm: Normal rate and regular rhythm.     Heart sounds: Normal heart sounds.  Pulmonary:     Effort: Pulmonary effort is normal.     Breath sounds: Normal breath sounds.  Abdominal:     General: Bowel sounds are normal.     Palpations: Abdomen is soft.  Musculoskeletal: Normal range of motion.        General: No tenderness or deformity.  Lymphadenopathy:     Cervical: No cervical adenopathy.  Skin:    General: Skin is warm and dry.     Findings: No erythema or rash.  Neurological:     Mental Status: She is alert and oriented to person, place, and time.  Psychiatric:        Behavior: Behavior normal.        Thought Content: Thought content normal.        Judgment: Judgment normal.      Lab Results  Component Value Date   WBC 5.9 11/06/2018   HGB 13.9 11/06/2018   HCT 43.9 11/06/2018   MCV 92.8 11/06/2018   PLT 207 11/06/2018     Chemistry      Component Value Date/Time   NA 144 11/06/2018 0923   NA 143 11/01/2016 1030   NA 141 02/03/2016 1118   K 4.8 11/06/2018 0923   K 4.8 (H) 11/01/2016 1030   K 4.0 02/03/2016 1118   CL 108 11/06/2018 0923   CL 107 11/01/2016 1030   CO2 30 11/06/2018 0923   CO2 30 11/01/2016 1030   CO2 26 02/03/2016 1118   BUN 19 11/06/2018 0923   BUN 25 (H) 11/01/2016 1030   BUN 21.9 02/03/2016 1118   CREATININE 0.88 11/06/2018 0923   CREATININE 0.9 11/01/2016 1030   CREATININE 0.9 02/03/2016 1118      Component Value Date/Time   CALCIUM 9.8 11/06/2018 0923   CALCIUM 10.0 11/01/2016 1030   CALCIUM 10.0 02/03/2016 1118   ALKPHOS 65 11/06/2018 0923   ALKPHOS 82 11/01/2016 1030   ALKPHOS 99 02/03/2016 1118   AST 28 11/06/2018 0923   AST 32 02/03/2016 1118   ALT 23 11/06/2018 0923   ALT 29 11/01/2016 1030   ALT 27 02/03/2016 1118   BILITOT 0.5 11/06/2018 0923   BILITOT  0.36 02/03/2016 1118         Impression and Plan: Kimberly [name] is a 78 year old postmenopausal female with stage IIb ductal carcinoma of the left breast. She had one positive lymph node. She had  a very low Oncotype score of 7, so I did not think that there will be a role for chemotherapy.  We will go ahead and plan to get her back to see [place] in 6 more months.   Again, she will get her Prolia today.  I told her not to worry about the emphysema.  Again this is just a radiographic diagnosis and not a clinical diagnosis.   Volanda Napoleon, MD 9/15/202010:11 AM

## 2018-11-07 ENCOUNTER — Encounter: Payer: Self-pay | Admitting: *Deleted

## 2018-11-07 LAB — CANCER ANTIGEN 27.29: CA 27.29: 40.4 U/mL — ABNORMAL HIGH (ref 0.0–38.6)

## 2018-11-07 LAB — VITAMIN D 25 HYDROXY (VIT D DEFICIENCY, FRACTURES): Vit D, 25-Hydroxy: 42.3 ng/mL (ref 30.0–100.0)

## 2019-02-13 ENCOUNTER — Other Ambulatory Visit: Payer: Self-pay | Admitting: *Deleted

## 2019-02-13 DIAGNOSIS — C50112 Malignant neoplasm of central portion of left female breast: Secondary | ICD-10-CM

## 2019-02-13 DIAGNOSIS — T386X5A Adverse effect of antigonadotrophins, antiestrogens, antiandrogens, not elsewhere classified, initial encounter: Secondary | ICD-10-CM

## 2019-02-13 DIAGNOSIS — M818 Other osteoporosis without current pathological fracture: Secondary | ICD-10-CM

## 2019-02-13 MED ORDER — LETROZOLE 2.5 MG PO TABS: 2.5000 mg | ORAL_TABLET | Freq: Every day | ORAL | 3 refills | Status: DC

## 2019-03-06 ENCOUNTER — Telehealth: Payer: Self-pay | Admitting: *Deleted

## 2019-03-06 NOTE — Telephone Encounter (Signed)
Call received from patient wanting to know if she should avoid the pool d/t Covid.  Pt notified per order of Dr. Marin Olp to avoid the pool at this time and to get the Covid vaccine when she is able to.  Pt appreciative of information and has no further questions or concerns at this time.

## 2019-05-07 ENCOUNTER — Inpatient Hospital Stay: Payer: Medicare Other | Attending: Hematology & Oncology | Admitting: Hematology & Oncology

## 2019-05-07 ENCOUNTER — Other Ambulatory Visit: Payer: Self-pay

## 2019-05-07 ENCOUNTER — Inpatient Hospital Stay: Payer: Medicare Other

## 2019-05-07 ENCOUNTER — Encounter: Payer: Self-pay | Admitting: Hematology & Oncology

## 2019-05-07 VITALS — BP 116/77 | HR 62 | Temp 97.3°F | Resp 16 | Wt 211.0 lb

## 2019-05-07 DIAGNOSIS — C50112 Malignant neoplasm of central portion of left female breast: Secondary | ICD-10-CM | POA: Diagnosis not present

## 2019-05-07 DIAGNOSIS — Z170 Estrogen receptor positive status [ER+]: Secondary | ICD-10-CM

## 2019-05-07 DIAGNOSIS — C50912 Malignant neoplasm of unspecified site of left female breast: Secondary | ICD-10-CM | POA: Diagnosis present

## 2019-05-07 DIAGNOSIS — M818 Other osteoporosis without current pathological fracture: Secondary | ICD-10-CM | POA: Diagnosis not present

## 2019-05-07 DIAGNOSIS — Z79811 Long term (current) use of aromatase inhibitors: Secondary | ICD-10-CM | POA: Insufficient documentation

## 2019-05-07 DIAGNOSIS — C50212 Malignant neoplasm of upper-inner quadrant of left female breast: Secondary | ICD-10-CM

## 2019-05-07 LAB — CBC WITH DIFFERENTIAL (CANCER CENTER ONLY)
Abs Immature Granulocytes: 0.01 10*3/uL (ref 0.00–0.07)
Basophils Absolute: 0.1 10*3/uL (ref 0.0–0.1)
Basophils Relative: 2 %
Eosinophils Absolute: 0.4 10*3/uL (ref 0.0–0.5)
Eosinophils Relative: 7 %
HCT: 45.5 % (ref 36.0–46.0)
Hemoglobin: 14.3 g/dL (ref 12.0–15.0)
Immature Granulocytes: 0 %
Lymphocytes Relative: 29 %
Lymphs Abs: 1.5 10*3/uL (ref 0.7–4.0)
MCH: 28.8 pg (ref 26.0–34.0)
MCHC: 31.4 g/dL (ref 30.0–36.0)
MCV: 91.5 fL (ref 80.0–100.0)
Monocytes Absolute: 0.7 10*3/uL (ref 0.1–1.0)
Monocytes Relative: 14 %
Neutro Abs: 2.5 10*3/uL (ref 1.7–7.7)
Neutrophils Relative %: 48 %
Platelet Count: 220 10*3/uL (ref 150–400)
RBC: 4.97 MIL/uL (ref 3.87–5.11)
RDW: 14.1 % (ref 11.5–15.5)
WBC Count: 5.2 10*3/uL (ref 4.0–10.5)
nRBC: 0 % (ref 0.0–0.2)

## 2019-05-07 LAB — CMP (CANCER CENTER ONLY)
ALT: 21 U/L (ref 0–44)
AST: 28 U/L (ref 15–41)
Albumin: 4.4 g/dL (ref 3.5–5.0)
Alkaline Phosphatase: 57 U/L (ref 38–126)
Anion gap: 6 (ref 5–15)
BUN: 27 mg/dL — ABNORMAL HIGH (ref 8–23)
CO2: 31 mmol/L (ref 22–32)
Calcium: 10.5 mg/dL — ABNORMAL HIGH (ref 8.9–10.3)
Chloride: 109 mmol/L (ref 98–111)
Creatinine: 1.05 mg/dL — ABNORMAL HIGH (ref 0.44–1.00)
GFR, Est AFR Am: 60 mL/min — ABNORMAL LOW (ref >60)
GFR, Estimated: 52 mL/min — ABNORMAL LOW (ref >60)
Glucose, Bld: 99 mg/dL (ref 70–99)
Potassium: 5.3 mmol/L — ABNORMAL HIGH (ref 3.5–5.1)
Sodium: 146 mmol/L — ABNORMAL HIGH (ref 135–145)
Total Bilirubin: 0.5 mg/dL (ref 0.3–1.2)
Total Protein: 7 g/dL (ref 6.5–8.1)

## 2019-05-07 MED ORDER — DENOSUMAB 60 MG/ML ~~LOC~~ SOSY
60.0000 mg | PREFILLED_SYRINGE | Freq: Once | SUBCUTANEOUS | Status: AC
  Administered 2019-05-07: 10:00:00 60 mg via SUBCUTANEOUS
  Filled 2019-05-07: qty 1

## 2019-05-07 MED ORDER — DENOSUMAB 60 MG/ML ~~LOC~~ SOSY
PREFILLED_SYRINGE | SUBCUTANEOUS | Status: AC
  Filled 2019-05-07: qty 1

## 2019-05-07 NOTE — Patient Instructions (Signed)
Denosumab injection What is this medicine? DENOSUMAB (den oh sue mab) slows bone breakdown. Prolia is used to treat osteoporosis in women after menopause and in men, and in people who are taking corticosteroids for 6 months or more. Xgeva is used to treat a high calcium level due to cancer and to prevent bone fractures and other bone problems caused by multiple myeloma or cancer bone metastases. Xgeva is also used to treat giant cell tumor of the bone. This medicine may be used for other purposes; ask your health care provider or pharmacist if you have questions. COMMON BRAND NAME(S): Prolia, XGEVA What should I tell my health care provider before I take this medicine? They need to know if you have any of these conditions:  dental disease  having surgery or tooth extraction  infection  kidney disease  low levels of calcium or Vitamin D in the blood  malnutrition  on hemodialysis  skin conditions or sensitivity  thyroid or parathyroid disease  an unusual reaction to denosumab, other medicines, foods, dyes, or preservatives  pregnant or trying to get pregnant  breast-feeding How should I use this medicine? This medicine is for injection under the skin. It is given by a health care professional in a hospital or clinic setting. A special MedGuide will be given to you before each treatment. Be sure to read this information carefully each time. For Prolia, talk to your pediatrician regarding the use of this medicine in children. Special care may be needed. For Xgeva, talk to your pediatrician regarding the use of this medicine in children. While this drug may be prescribed for children as young as 13 years for selected conditions, precautions do apply. Overdosage: If you think you have taken too much of this medicine contact a poison control center or emergency room at once. NOTE: This medicine is only for you. Do not share this medicine with others. What if I miss a dose? It is  important not to miss your dose. Call your doctor or health care professional if you are unable to keep an appointment. What may interact with this medicine? Do not take this medicine with any of the following medications:  other medicines containing denosumab This medicine may also interact with the following medications:  medicines that lower your chance of fighting infection  steroid medicines like prednisone or cortisone This list may not describe all possible interactions. Give your health care provider a list of all the medicines, herbs, non-prescription drugs, or dietary supplements you use. Also tell them if you smoke, drink alcohol, or use illegal drugs. Some items may interact with your medicine. What should I watch for while using this medicine? Visit your doctor or health care professional for regular checks on your progress. Your doctor or health care professional may order blood tests and other tests to see how you are doing. Call your doctor or health care professional for advice if you get a fever, chills or sore throat, or other symptoms of a cold or flu. Do not treat yourself. This drug may decrease your body's ability to fight infection. Try to avoid being around people who are sick. You should make sure you get enough calcium and vitamin D while you are taking this medicine, unless your doctor tells you not to. Discuss the foods you eat and the vitamins you take with your health care professional. See your dentist regularly. Brush and floss your teeth as directed. Before you have any dental work done, tell your dentist you are   receiving this medicine. Do not become pregnant while taking this medicine or for 5 months after stopping it. Talk with your doctor or health care professional about your birth control options while taking this medicine. Women should inform their doctor if they wish to become pregnant or think they might be pregnant. There is a potential for serious side  effects to an unborn child. Talk to your health care professional or pharmacist for more information. What side effects may I notice from receiving this medicine? Side effects that you should report to your doctor or health care professional as soon as possible:  allergic reactions like skin rash, itching or hives, swelling of the face, lips, or tongue  bone pain  breathing problems  dizziness  jaw pain, especially after dental work  redness, blistering, peeling of the skin  signs and symptoms of infection like fever or chills; cough; sore throat; pain or trouble passing urine  signs of low calcium like fast heartbeat, muscle cramps or muscle pain; pain, tingling, numbness in the hands or feet; seizures  unusual bleeding or bruising  unusually weak or tired Side effects that usually do not require medical attention (report to your doctor or health care professional if they continue or are bothersome):  constipation  diarrhea  headache  joint pain  loss of appetite  muscle pain  runny nose  tiredness  upset stomach This list may not describe all possible side effects. Call your doctor for medical advice about side effects. You may report side effects to FDA at 1-800-FDA-1088. Where should I keep my medicine? This medicine is only given in a clinic, doctor's office, or other health care setting and will not be stored at home. NOTE: This sheet is a summary. It may not cover all possible information. If you have questions about this medicine, talk to your doctor, pharmacist, or health care provider.  2020 [employer]/Gold Standard (2017-06-16 16:10:44)

## 2019-05-07 NOTE — Progress Notes (Signed)
Hematology and Oncology Follow Up Visit  JACOLYN JOAQUIN [id number] 1940/11/13 79 y.o. 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  Oncotype score is 7  Current Therapy:    Femara 2.5 mg by mouth daily -- complete 5 yrs in 06/2020  Prolia 60 mg subcutaneous every 6 months - next dose in September 2021  S/p XRT - completed in May 2017 - 50.4 [name]     Interim History:  Ms. Sulton is back for follow-up. She really looks good.  She has had no problems since we last saw her.  She has had no issues with nausea or vomiting.  She has had no problems with fever.  She has yet to have the coronavirus vaccine.  She wants to go back to water aerobics.  I think it would be okay for her to do this.  I know she has not had the vaccine yet but I still think that she is able to go and do water aerobics which really will help her arthritis.  She is on the Femara.  I am sure this probably is part of why she has her arthritis.  She is on vitamin D.  She is still worried about having this "emphysema" which was reported on a chest x-ray.  I told her that emphysema is a clinical diagnosis and that she needs to have clinical parameters to meet the diagnosis of emphysema.  She has nothing like this.   She gets her Prolia today.    Overall, her performance status is ECOG 1.   Medications:  Current Outpatient Medications:  .  acetaminophen (TYLENOL) 650 MG CR tablet, Take 1,300 mg by mouth. Take 2 pills in the PM, Disp: , Rfl:  .  Ascorbic Acid (VITAMIN C) 1000 MG tablet, Take 1,000 mg by mouth daily., Disp: , Rfl:  .  aspirin EC 81 MG tablet, Take 81 mg by mouth daily., Disp: , Rfl:  .  calcium carbonate (CALCIUM 600) 600 MG TABS tablet, Take 600 mg by mouth daily. , Disp: , Rfl:  .  Cholecalciferol (VITAMIN D) 2000 units CAPS, Take by mouth., Disp: , Rfl:  .  Coenzyme Q10-Vitamin E (QUNOL ULTRA COQ10 PO), Take 2 capsules by mouth daily., Disp:  , Rfl:  .  Glucosamine-Chondroit-Vit C-Mn (GLUCOSAMINE CHONDR 500 COMPLEX PO), Take by mouth., Disp: , Rfl:  .  letrozole (FEMARA) 2.5 MG tablet, Take 1 tablet (2.5 mg total) by mouth daily., Disp: 90 tablet, Rfl: 3 .  loratadine (CLARITIN) 10 MG tablet, Take 10 mg by mouth daily., Disp: , Rfl:  .  meclizine (ANTIVERT) 25 MG tablet, Take by mouth 2 (two) times daily as needed. , Disp: , Rfl:  .  Multiple Vitamin (MULTI-VITAMINS) TABS, Take by mouth., Disp: , Rfl:  .  naproxen sodium (ANAPROX) 220 MG tablet, Take 220 mg by mouth daily., Disp: , Rfl:  .  Pyridoxine HCl (VITAMIN B-6 PO), Take by mouth daily. , Disp: , Rfl:   Allergies:  Allergies  Allergen Reactions  . Codeine Nausea And Vomiting and Other (See Comments)    Head feels funny.  . Codeine Sulfate Other (See Comments)    Head feels funny.    Past Medical History, Surgical history, Social history, and Family History were reviewed and updated.  Review of Systems: Review of Systems  Constitutional: Negative.   HENT: Negative.   Eyes: Negative.   Respiratory: Negative.   Cardiovascular: Negative.  Gastrointestinal: Negative.   Genitourinary: Negative.   Musculoskeletal: Positive for joint pain.  Skin: Negative.   Neurological: Negative.   Endo/Heme/Allergies: Negative.   Psychiatric/Behavioral: Negative.      Physical Exam:  weight is 211 lb (95.7 kg). Her temporal temperature is 97.3 F (36.3 C) (abnormal). Her blood pressure is 116/77 and her pulse is 62. Her respiration is 16 and oxygen saturation is 96%.   Wt Readings from Last 3 Encounters:  05/07/19 211 lb (95.7 kg)  11/06/18 211 lb (95.7 kg)  05/08/18 216 lb (98 kg)     Physical Exam Vitals reviewed.  Constitutional:      Comments: Breast exam shows right breast but no masses, edema or erythema.  There is no right axillary adenopathy.  Left chest wall shows well-healed mastectomy.  There is no chest wall nodules or erythema.  There is no tenderness.   She has no left axillary adenopathy.  HENT:     Head: Normocephalic and atraumatic.  Eyes:     Pupils: Pupils are equal, round, and reactive to light.  Cardiovascular:     Rate and Rhythm: Normal rate and regular rhythm.     Heart sounds: Normal heart sounds.  Pulmonary:     Effort: Pulmonary effort is normal.     Breath sounds: Normal breath sounds.  Abdominal:     General: Bowel sounds are normal.     Palpations: Abdomen is soft.  Musculoskeletal:        General: No tenderness or deformity. Normal range of motion.     Cervical back: Normal range of motion.  Lymphadenopathy:     Cervical: No cervical adenopathy.  Skin:    General: Skin is warm and dry.     Findings: No erythema or rash.  Neurological:     Mental Status: She is alert and oriented to person, place, and time.  Psychiatric:        Behavior: Behavior normal.        Thought Content: Thought content normal.        Judgment: Judgment normal.      Lab Results  Component Value Date   WBC 5.2 05/07/2019   HGB 14.3 05/07/2019   HCT 45.5 05/07/2019   MCV 91.5 05/07/2019   PLT 220 05/07/2019     Chemistry      Component Value Date/Time   NA 146 (H) 05/07/2019 0854   NA 143 11/01/2016 1030   NA 141 02/03/2016 1118   K 5.3 (H) 05/07/2019 0854   K 4.8 (H) 11/01/2016 1030   K 4.0 02/03/2016 1118   CL 109 05/07/2019 0854   CL 107 11/01/2016 1030   CO2 31 05/07/2019 0854   CO2 30 11/01/2016 1030   CO2 26 02/03/2016 1118   BUN 27 (H) 05/07/2019 0854   BUN 25 (H) 11/01/2016 1030   BUN 21.9 02/03/2016 1118   CREATININE 1.05 (H) 05/07/2019 0854   CREATININE 0.9 11/01/2016 1030   CREATININE 0.9 02/03/2016 1118      Component Value Date/Time   CALCIUM 10.5 (H) 05/07/2019 0854   CALCIUM 10.0 11/01/2016 1030   CALCIUM 10.0 02/03/2016 1118   ALKPHOS 57 05/07/2019 0854   ALKPHOS 82 11/01/2016 1030   ALKPHOS 99 02/03/2016 1118   AST 28 05/07/2019 0854   AST 32 02/03/2016 1118   ALT 21 05/07/2019 0854   ALT 29  11/01/2016 1030   ALT 27 02/03/2016 1118   BILITOT 0.5 05/07/2019 0854   BILITOT 0.36 02/03/2016  89         Impression and Plan: Ms. Royal is a 79 year old postmenopausal female with stage IIb ductal carcinoma of the left breast. She had one positive lymph node. She had a very low Oncotype score of 7, so I did not think that there will be a role for chemotherapy.  We will go ahead and plan to get her back to see [place] in 6 more months.   Again, she will get her Prolia today.  I know that her water aerobics will definitely be helpful for her arthritis.  I encouraged her to do this.  Volanda Napoleon, MD 3/16/20219:56 AM

## 2019-05-13 ENCOUNTER — Telehealth: Payer: Self-pay | Admitting: *Deleted

## 2019-05-13 NOTE — Telephone Encounter (Signed)
Call received from patient requesting LDH results for her husband and CA 27.29 results for her from their visit on 05/07/19.  Informed pt that CA 27.29 and LDH was not drawn at their last visit on 05/07/19.  Dr. Marin Olp notified.  Notified patient that Dr. Marin Olp does not feel as though LDH needs to be drawn at this time for pt.'s husband and that if pt would like CA 27.29, we can do that at any time for her.  Pt states that she would like to speak with her daughter and husband and will call office back if she would like CA 27.29 drawn.  Pt has no other questions at this time.

## 2019-08-22 ENCOUNTER — Other Ambulatory Visit: Payer: Self-pay | Admitting: Internal Medicine

## 2019-08-22 DIAGNOSIS — Z1231 Encounter for screening mammogram for malignant neoplasm of breast: Secondary | ICD-10-CM

## 2019-10-02 ENCOUNTER — Ambulatory Visit
Admission: RE | Admit: 2019-10-02 | Discharge: 2019-10-02 | Disposition: A | Discharge status: Home / Self Care | Payer: Medicare Other | Source: Ambulatory Visit | Attending: Internal Medicine | Admitting: Internal Medicine

## 2019-10-02 ENCOUNTER — Other Ambulatory Visit: Payer: Self-pay

## 2019-10-02 DIAGNOSIS — Z1231 Encounter for screening mammogram for malignant neoplasm of breast: Secondary | ICD-10-CM

## 2019-11-07 ENCOUNTER — Encounter: Payer: Self-pay | Admitting: Hematology & Oncology

## 2019-11-07 ENCOUNTER — Inpatient Hospital Stay (HOSPITAL_BASED_OUTPATIENT_CLINIC_OR_DEPARTMENT_OTHER): Payer: Medicare Other | Admitting: Hematology & Oncology

## 2019-11-07 ENCOUNTER — Inpatient Hospital Stay: Payer: Medicare Other | Attending: Hematology & Oncology

## 2019-11-07 ENCOUNTER — Other Ambulatory Visit: Payer: Self-pay

## 2019-11-07 ENCOUNTER — Inpatient Hospital Stay: Payer: Medicare Other

## 2019-11-07 VITALS — BP 129/87 | HR 65 | Temp 97.6°F | Resp 18 | Ht 64.0 in | Wt 212.0 lb

## 2019-11-07 DIAGNOSIS — C50912 Malignant neoplasm of unspecified site of left female breast: Secondary | ICD-10-CM | POA: Insufficient documentation

## 2019-11-07 DIAGNOSIS — C50212 Malignant neoplasm of upper-inner quadrant of left female breast: Secondary | ICD-10-CM

## 2019-11-07 DIAGNOSIS — Z170 Estrogen receptor positive status [ER+]: Secondary | ICD-10-CM | POA: Insufficient documentation

## 2019-11-07 DIAGNOSIS — Z79811 Long term (current) use of aromatase inhibitors: Secondary | ICD-10-CM | POA: Diagnosis not present

## 2019-11-07 DIAGNOSIS — C50112 Malignant neoplasm of central portion of left female breast: Secondary | ICD-10-CM

## 2019-11-07 DIAGNOSIS — M818 Other osteoporosis without current pathological fracture: Secondary | ICD-10-CM

## 2019-11-07 LAB — CMP (CANCER CENTER ONLY)
ALT: 21 U/L (ref 0–44)
AST: 26 U/L (ref 15–41)
Albumin: 4.1 g/dL (ref 3.5–5.0)
Alkaline Phosphatase: 60 U/L (ref 38–126)
Anion gap: 7 (ref 5–15)
BUN: 26 mg/dL — ABNORMAL HIGH (ref 8–23)
CO2: 31 mmol/L (ref 22–32)
Calcium: 10.2 mg/dL (ref 8.9–10.3)
Chloride: 106 mmol/L (ref 98–111)
Creatinine: 0.97 mg/dL (ref 0.44–1.00)
GFR, Est AFR Am: >60 mL/min (ref >60)
GFR, Estimated: 57 mL/min — ABNORMAL LOW (ref >60)
Glucose, Bld: 91 mg/dL (ref 70–99)
Potassium: 4.3 mmol/L (ref 3.5–5.1)
Sodium: 144 mmol/L (ref 135–145)
Total Bilirubin: 0.5 mg/dL (ref 0.3–1.2)
Total Protein: 6.8 g/dL (ref 6.5–8.1)

## 2019-11-07 LAB — CBC WITH DIFFERENTIAL (CANCER CENTER ONLY)
Abs Immature Granulocytes: 0.01 10*3/uL (ref 0.00–0.07)
Basophils Absolute: 0.1 10*3/uL (ref 0.0–0.1)
Basophils Relative: 2 %
Eosinophils Absolute: 0.6 10*3/uL — ABNORMAL HIGH (ref 0.0–0.5)
Eosinophils Relative: 11 %
HCT: 43.9 % (ref 36.0–46.0)
Hemoglobin: 13.8 g/dL (ref 12.0–15.0)
Immature Granulocytes: 0 %
Lymphocytes Relative: 29 %
Lymphs Abs: 1.5 10*3/uL (ref 0.7–4.0)
MCH: 29 pg (ref 26.0–34.0)
MCHC: 31.4 g/dL (ref 30.0–36.0)
MCV: 92.2 fL (ref 80.0–100.0)
Monocytes Absolute: 0.7 10*3/uL (ref 0.1–1.0)
Monocytes Relative: 13 %
Neutro Abs: 2.2 10*3/uL (ref 1.7–7.7)
Neutrophils Relative %: 45 %
Platelet Count: 208 10*3/uL (ref 150–400)
RBC: 4.76 MIL/uL (ref 3.87–5.11)
RDW: 14 % (ref 11.5–15.5)
WBC Count: 5 10*3/uL (ref 4.0–10.5)
nRBC: 0 % (ref 0.0–0.2)

## 2019-11-07 LAB — VITAMIN D 25 HYDROXY (VIT D DEFICIENCY, FRACTURES): Vit D, 25-Hydroxy: 46.51 ng/mL (ref 30–100)

## 2019-11-07 MED ORDER — DENOSUMAB 60 MG/ML ~~LOC~~ SOSY
PREFILLED_SYRINGE | SUBCUTANEOUS | Status: AC
  Filled 2019-11-07: qty 1

## 2019-11-07 MED ORDER — DENOSUMAB 60 MG/ML ~~LOC~~ SOSY
60.0000 mg | PREFILLED_SYRINGE | Freq: Once | SUBCUTANEOUS | Status: AC
  Administered 2019-11-07: 60 mg via SUBCUTANEOUS
  Filled 2019-11-07: qty 1

## 2019-11-07 NOTE — Patient Instructions (Signed)
Denosumab injection What is this medicine? DENOSUMAB (den oh sue mab) slows bone breakdown. Prolia is used to treat osteoporosis in women after menopause and in men, and in people who are taking corticosteroids for 6 months or more. Xgeva is used to treat a high calcium level due to cancer and to prevent bone fractures and other bone problems caused by multiple myeloma or cancer bone metastases. Xgeva is also used to treat giant cell tumor of the bone. This medicine may be used for other purposes; ask your health care provider or pharmacist if you have questions. COMMON BRAND NAME(S): Prolia, XGEVA What should I tell my health care provider before I take this medicine? They need to know if you have any of these conditions:  dental disease  having surgery or tooth extraction  infection  kidney disease  low levels of calcium or Vitamin D in the blood  malnutrition  on hemodialysis  skin conditions or sensitivity  thyroid or parathyroid disease  an unusual reaction to denosumab, other medicines, foods, dyes, or preservatives  pregnant or trying to get pregnant  breast-feeding How should I use this medicine? This medicine is for injection under the skin. It is given by a health care professional in a hospital or clinic setting. A special MedGuide will be given to you before each treatment. Be sure to read this information carefully each time. For Prolia, talk to your pediatrician regarding the use of this medicine in children. Special care may be needed. For Xgeva, talk to your pediatrician regarding the use of this medicine in children. While this drug may be prescribed for children as young as 13 years for selected conditions, precautions do apply. Overdosage: If you think you have taken too much of this medicine contact a poison control center or emergency room at once. NOTE: This medicine is only for you. Do not share this medicine with others. What if I miss a dose? It is  important not to miss your dose. Call your doctor or health care professional if you are unable to keep an appointment. What may interact with this medicine? Do not take this medicine with any of the following medications:  other medicines containing denosumab This medicine may also interact with the following medications:  medicines that lower your chance of fighting infection  steroid medicines like prednisone or cortisone This list may not describe all possible interactions. Give your health care provider a list of all the medicines, herbs, non-prescription drugs, or dietary supplements you use. Also tell them if you smoke, drink alcohol, or use illegal drugs. Some items may interact with your medicine. What should I watch for while using this medicine? Visit your doctor or health care professional for regular checks on your progress. Your doctor or health care professional may order blood tests and other tests to see how you are doing. Call your doctor or health care professional for advice if you get a fever, chills or sore throat, or other symptoms of a cold or flu. Do not treat yourself. This drug may decrease your body's ability to fight infection. Try to avoid being around people who are sick. You should make sure you get enough calcium and vitamin D while you are taking this medicine, unless your doctor tells you not to. Discuss the foods you eat and the vitamins you take with your health care professional. See your dentist regularly. Brush and floss your teeth as directed. Before you have any dental work done, tell your dentist you are   receiving this medicine. Do not become pregnant while taking this medicine or for 5 months after stopping it. Talk with your doctor or health care professional about your birth control options while taking this medicine. Women should inform their doctor if they wish to become pregnant or think they might be pregnant. There is a potential for serious side  effects to an unborn child. Talk to your health care professional or pharmacist for more information. What side effects may I notice from receiving this medicine? Side effects that you should report to your doctor or health care professional as soon as possible:  allergic reactions like skin rash, itching or hives, swelling of the face, lips, or tongue  bone pain  breathing problems  dizziness  jaw pain, especially after dental work  redness, blistering, peeling of the skin  signs and symptoms of infection like fever or chills; cough; sore throat; pain or trouble passing urine  signs of low calcium like fast heartbeat, muscle cramps or muscle pain; pain, tingling, numbness in the hands or feet; seizures  unusual bleeding or bruising  unusually weak or tired Side effects that usually do not require medical attention (report to your doctor or health care professional if they continue or are bothersome):  constipation  diarrhea  headache  joint pain  loss of appetite  muscle pain  runny nose  tiredness  upset stomach This list may not describe all possible side effects. Call your doctor for medical advice about side effects. You may report side effects to FDA at 1-800-FDA-1088. Where should I keep my medicine? This medicine is only given in a clinic, doctor's office, or other health care setting and will not be stored at home. NOTE: This sheet is a summary. It may not cover all possible information. If you have questions about this medicine, talk to your doctor, pharmacist, or health care provider.  2020 [employer]/Gold Standard (2017-06-16 16:10:44)

## 2019-11-07 NOTE — Progress Notes (Signed)
Hematology and Oncology Follow Up Visit  Kimberly [name] [id number] Jan 12, 1941 79 y.o. 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  Oncotype score is 7  Current Therapy:    Femara 2.5 mg by mouth daily -- complete 5 yrs in 06/2020  Prolia 60 mg subcutaneous every 6 months - next dose in September 2021  S/p XRT - completed in May 2017 - 50.4 Kimberly [name]     Interim History:  Kimberly [name] is back for follow-up. She really looks good.  She has had no problems since we last saw her.  She is doing her water aerobics 3 times a week.  She really enjoys this.  She and her family did go to the beach earlier this summer.  She has had no issues with the Femara.  She has had no increased problems with arthritis.  She has had no joint swelling.  There is been no issues with her appetite.  She has had no nausea or vomiting.  She has had no change in bowel or bladder habits.  There is been no leg swelling.  She has had no rashes.    She gets her Prolia today.    Overall, her performance status is ECOG 1.   Medications:  Current Outpatient Medications:  .  acetaminophen (TYLENOL) 650 MG CR tablet, Take 1,300 mg by mouth. Take 2 pills in the PM, Disp: , Rfl:  .  Ascorbic Acid (VITAMIN C) 1000 MG tablet, Take 1,000 mg by mouth daily., Disp: , Rfl:  .  aspirin EC 81 MG tablet, Take 81 mg by mouth daily., Disp: , Rfl:  .  calcium carbonate (CALCIUM 600) 600 MG TABS tablet, Take 600 mg by mouth daily. , Disp: , Rfl:  .  Cholecalciferol (VITAMIN D) 2000 units CAPS, Take by mouth., Disp: , Rfl:  .  Coenzyme Q10-Vitamin E (QUNOL ULTRA COQ10 PO), Take 2 capsules by mouth daily., Disp: , Rfl:  .  Glucosamine-Chondroit-Vit C-Mn (GLUCOSAMINE CHONDR 500 COMPLEX PO), Take by mouth., Disp: , Rfl:  .  letrozole (FEMARA) 2.5 MG tablet, Take 1 tablet (2.5 mg total) by mouth daily., Disp: 90 tablet, Rfl: 3 .  loratadine (CLARITIN) 10 MG tablet, Take 10 mg  by mouth daily., Disp: , Rfl:  .  meclizine (ANTIVERT) 25 MG tablet, Take by mouth 2 (two) times daily as needed. , Disp: , Rfl:  .  Multiple Vitamin (MULTI-VITAMINS) TABS, Take by mouth., Disp: , Rfl:  .  naproxen sodium (ANAPROX) 220 MG tablet, Take 220 mg by mouth daily., Disp: , Rfl:  .  Pyridoxine HCl (VITAMIN B-6 PO), Take by mouth daily. , Disp: , Rfl:   Allergies:  Allergies  Allergen Reactions  . Codeine Nausea And Vomiting and Other (See Comments)    Head feels funny.  . Codeine Sulfate Other (See Comments)    Head feels funny.    Past Medical History, Surgical history, Social history, and Family History were reviewed and updated.  Review of Systems: Review of Systems  Constitutional: Negative.   HENT: Negative.   Eyes: Negative.   Respiratory: Negative.   Cardiovascular: Negative.   Gastrointestinal: Negative.   Genitourinary: Negative.   Musculoskeletal: Positive for joint pain.  Skin: Negative.   Neurological: Negative.   Endo/Heme/Allergies: Negative.   Psychiatric/Behavioral: Negative.      Physical Exam:  height is $RemoveB'5\' 4"'faMmnwBj$  (1.626 m) and weight is 212 lb 0.6 oz (96.2 kg). Her oral temperature  is 97.6 F (36.4 C). Her blood pressure is 129/87 and her pulse is 65. Her respiration is 18 and oxygen saturation is 100%.   Wt Readings from Last 3 Encounters:  11/07/19 212 lb 0.6 oz (96.2 kg)  05/07/19 211 lb (95.7 kg)  11/06/18 211 lb (95.7 kg)     Physical Exam Vitals reviewed.  Constitutional:      Comments: Breast exam shows right breast but no masses, edema or erythema.  There is no right axillary adenopathy.  Left chest wall shows well-healed mastectomy.  There is no chest wall nodules or erythema.  There is no tenderness.  She has no left axillary adenopathy.  HENT:     Head: Normocephalic and atraumatic.  Eyes:     Pupils: Pupils are equal, round, and reactive to light.  Cardiovascular:     Rate and Rhythm: Normal rate and regular rhythm.     Heart  sounds: Normal heart sounds.  Pulmonary:     Effort: Pulmonary effort is normal.     Breath sounds: Normal breath sounds.  Abdominal:     General: Bowel sounds are normal.     Palpations: Abdomen is soft.  Musculoskeletal:        General: No tenderness or deformity. Normal range of motion.     Cervical back: Normal range of motion.  Lymphadenopathy:     Cervical: No cervical adenopathy.  Skin:    General: Skin is warm and dry.     Findings: No erythema or rash.  Neurological:     Mental Status: She is alert and oriented to person, place, and time.  Psychiatric:        Behavior: Behavior normal.        Thought Content: Thought content normal.        Judgment: Judgment normal.      Lab Results  Component Value Date   WBC 5.0 11/07/2019   HGB 13.8 11/07/2019   HCT 43.9 11/07/2019   MCV 92.2 11/07/2019   PLT 208 11/07/2019     Chemistry      Component Value Date/Time   NA 146 (H) 05/07/2019 0854   NA 143 11/01/2016 1030   NA 141 02/03/2016 1118   K 5.3 (H) 05/07/2019 0854   K 4.8 (H) 11/01/2016 1030   K 4.0 02/03/2016 1118   CL 109 05/07/2019 0854   CL 107 11/01/2016 1030   CO2 31 05/07/2019 0854   CO2 30 11/01/2016 1030   CO2 26 02/03/2016 1118   BUN 27 (H) 05/07/2019 0854   BUN 25 (H) 11/01/2016 1030   BUN 21.9 02/03/2016 1118   CREATININE 1.05 (H) 05/07/2019 0854   CREATININE 0.9 11/01/2016 1030   CREATININE 0.9 02/03/2016 1118      Component Value Date/Time   CALCIUM 10.5 (H) 05/07/2019 0854   CALCIUM 10.0 11/01/2016 1030   CALCIUM 10.0 02/03/2016 1118   ALKPHOS 57 05/07/2019 0854   ALKPHOS 82 11/01/2016 1030   ALKPHOS 99 02/03/2016 1118   AST 28 05/07/2019 0854   AST 32 02/03/2016 1118   ALT 21 05/07/2019 0854   ALT 29 11/01/2016 1030   ALT 27 02/03/2016 1118   BILITOT 0.5 05/07/2019 0854   BILITOT 0.36 02/03/2016 1118     Impression and Plan: Kimberly [name] is a 79 year old postmenopausal female with stage IIb ductal carcinoma of the left breast.  She had one positive lymph node. She had a very low Oncotype score of 7, so I did not think  that there will be a role for chemotherapy.  We will go ahead and plan to get her back to see [place] in 6 more months.   Again, she will get her Prolia today.  I am just glad that her quality of life is doing so well.  I am glad that she is able to keep her husband "in check."  Volanda Napoleon, MD 9/16/20218:42 AM

## 2020-03-11 ENCOUNTER — Other Ambulatory Visit: Payer: Self-pay | Admitting: *Deleted

## 2020-03-11 DIAGNOSIS — T386X5A Adverse effect of antigonadotrophins, antiestrogens, antiandrogens, not elsewhere classified, initial encounter: Secondary | ICD-10-CM

## 2020-03-11 DIAGNOSIS — M818 Other osteoporosis without current pathological fracture: Secondary | ICD-10-CM

## 2020-03-11 DIAGNOSIS — C50112 Malignant neoplasm of central portion of left female breast: Secondary | ICD-10-CM

## 2020-03-11 MED ORDER — LETROZOLE 2.5 MG PO TABS: 2.5000 mg | ORAL_TABLET | Freq: Every day | ORAL | 3 refills | Status: DC

## 2020-05-07 ENCOUNTER — Other Ambulatory Visit: Payer: Medicare Other

## 2020-05-07 ENCOUNTER — Ambulatory Visit: Payer: Medicare Other | Admitting: Hematology & Oncology

## 2020-05-07 ENCOUNTER — Ambulatory Visit: Payer: Medicare Other

## 2020-05-12 ENCOUNTER — Inpatient Hospital Stay: Payer: Medicare Other

## 2020-05-12 ENCOUNTER — Inpatient Hospital Stay (HOSPITAL_BASED_OUTPATIENT_CLINIC_OR_DEPARTMENT_OTHER): Payer: Medicare Other | Admitting: Hematology & Oncology

## 2020-05-12 ENCOUNTER — Other Ambulatory Visit: Payer: Self-pay

## 2020-05-12 ENCOUNTER — Encounter: Payer: Self-pay | Admitting: Hematology & Oncology

## 2020-05-12 ENCOUNTER — Inpatient Hospital Stay: Payer: Medicare Other | Attending: Hematology & Oncology

## 2020-05-12 VITALS — BP 124/74 | HR 78 | Temp 98.1°F | Resp 18 | Wt 211.8 lb

## 2020-05-12 DIAGNOSIS — C779 Secondary and unspecified malignant neoplasm of lymph node, unspecified: Secondary | ICD-10-CM | POA: Diagnosis not present

## 2020-05-12 DIAGNOSIS — C50112 Malignant neoplasm of central portion of left female breast: Secondary | ICD-10-CM | POA: Diagnosis not present

## 2020-05-12 DIAGNOSIS — Z170 Estrogen receptor positive status [ER+]: Secondary | ICD-10-CM | POA: Insufficient documentation

## 2020-05-12 DIAGNOSIS — Z79811 Long term (current) use of aromatase inhibitors: Secondary | ICD-10-CM | POA: Diagnosis not present

## 2020-05-12 DIAGNOSIS — C50212 Malignant neoplasm of upper-inner quadrant of left female breast: Secondary | ICD-10-CM

## 2020-05-12 DIAGNOSIS — C50912 Malignant neoplasm of unspecified site of left female breast: Secondary | ICD-10-CM | POA: Insufficient documentation

## 2020-05-12 LAB — CBC WITH DIFFERENTIAL (CANCER CENTER ONLY)
Abs Immature Granulocytes: 0.01 10*3/uL (ref 0.00–0.07)
Basophils Absolute: 0.1 10*3/uL (ref 0.0–0.1)
Basophils Relative: 2 %
Eosinophils Absolute: 0.5 10*3/uL (ref 0.0–0.5)
Eosinophils Relative: 9 %
HCT: 45.1 % (ref 36.0–46.0)
Hemoglobin: 14.3 g/dL (ref 12.0–15.0)
Immature Granulocytes: 0 %
Lymphocytes Relative: 26 %
Lymphs Abs: 1.4 10*3/uL (ref 0.7–4.0)
MCH: 29.1 pg (ref 26.0–34.0)
MCHC: 31.7 g/dL (ref 30.0–36.0)
MCV: 91.7 fL (ref 80.0–100.0)
Monocytes Absolute: 0.7 10*3/uL (ref 0.1–1.0)
Monocytes Relative: 12 %
Neutro Abs: 2.7 10*3/uL (ref 1.7–7.7)
Neutrophils Relative %: 51 %
Platelet Count: 209 10*3/uL (ref 150–400)
RBC: 4.92 MIL/uL (ref 3.87–5.11)
RDW: 14.2 % (ref 11.5–15.5)
WBC Count: 5.4 10*3/uL (ref 4.0–10.5)
nRBC: 0 % (ref 0.0–0.2)

## 2020-05-12 LAB — CMP (CANCER CENTER ONLY)
ALT: 24 U/L (ref 0–44)
AST: 35 U/L (ref 15–41)
Albumin: 4.4 g/dL (ref 3.5–5.0)
Alkaline Phosphatase: 64 U/L (ref 38–126)
Anion gap: 9 (ref 5–15)
BUN: 22 mg/dL (ref 8–23)
CO2: 29 mmol/L (ref 22–32)
Calcium: 10.3 mg/dL (ref 8.9–10.3)
Chloride: 104 mmol/L (ref 98–111)
Creatinine: 0.87 mg/dL (ref 0.44–1.00)
GFR, Estimated: >60 mL/min (ref >60)
Glucose, Bld: 91 mg/dL (ref 70–99)
Potassium: 4 mmol/L (ref 3.5–5.1)
Sodium: 142 mmol/L (ref 135–145)
Total Bilirubin: 0.6 mg/dL (ref 0.3–1.2)
Total Protein: 7.3 g/dL (ref 6.5–8.1)

## 2020-05-12 MED ORDER — DENOSUMAB 60 MG/ML ~~LOC~~ SOSY
PREFILLED_SYRINGE | SUBCUTANEOUS | Status: AC
  Filled 2020-05-12: qty 1

## 2020-05-12 MED ORDER — DENOSUMAB 60 MG/ML ~~LOC~~ SOSY
60.0000 mg | PREFILLED_SYRINGE | Freq: Once | SUBCUTANEOUS | Status: AC
  Administered 2020-05-12: 60 mg via SUBCUTANEOUS
  Filled 2020-05-12: qty 1

## 2020-05-12 NOTE — Progress Notes (Signed)
Hematology and Oncology Follow Up Visit  Kimberly Kimberly [name] [id number] [birth year] 80 y.o. 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  Oncotype score is 7  Current Therapy:    Femara 2.5 mg by mouth daily -- completed 5 yrs in 05/2020  Prolia 60 mg subcutaneous every 6 months - next dose in September 2022  S/p XRT - completed in May 2017 - 50.4 [name]     Interim History:  Kimberly Kimberly [name] is back for follow-up. She really looks good.  We see her every 6 months.  She is quite happy because the [place] basketball [employer] 1 in the [employer].  She has had a good Christmas.  She had no problems over the winter.  She will complete her Femara probably in April.  She is happy to be off the Femara.  She has had no problems with cough or shortness of breath.  She has had no nausea or vomiting.  She has had no change in bowel or bladder habits.  She has had no rashes.  There is been no headache.  I am just happy that she is done so well.  Her quality of life has been doing so nicely.  Overall, her performance status is ECOG 0.    Medications:  Current Outpatient Medications:  .  acetaminophen (TYLENOL) 650 MG CR tablet, Take 1,300 mg by mouth. Take 2 pills in the PM, Disp: , Rfl:  .  Ascorbic Acid (VITAMIN C) 1000 MG tablet, Take 1,000 mg by mouth daily., Disp: , Rfl:  .  aspirin EC 81 MG tablet, Take 81 mg by mouth daily., Disp: , Rfl:  .  calcium carbonate (OS-CAL) 600 MG TABS tablet, Take 600 mg by mouth daily. , Disp: , Rfl:  .  Cholecalciferol (VITAMIN D) 2000 units CAPS, Take by mouth., Disp: , Rfl:  .  Coenzyme Q10-Vitamin E (QUNOL ULTRA COQ10 PO), Take 2 capsules by mouth daily., Disp: , Rfl:  .  Glucosamine-Chondroit-Vit C-Mn (GLUCOSAMINE CHONDR 500 COMPLEX PO), Take by mouth., Disp: , Rfl:  .  letrozole (FEMARA) 2.5 MG tablet, Take 1 tablet (2.5 mg total) by mouth daily., Disp: 90 tablet, Rfl: 3 .  loratadine  (CLARITIN) 10 MG tablet, Take 10 mg by mouth daily., Disp: , Rfl:  .  Multiple Vitamin (MULTI-VITAMINS) TABS, Take by mouth., Disp: , Rfl:  .  naproxen sodium (ANAPROX) 220 MG tablet, Take 220 mg by mouth daily., Disp: , Rfl:  .  Pyridoxine HCl (VITAMIN B-6 PO), Take by mouth daily. , Disp: , Rfl:  .  zinc gluconate 50 MG tablet, Take 50 mg by mouth daily., Disp: , Rfl:  .  meclizine (ANTIVERT) 25 MG tablet, Take by mouth 2 (two) times daily as needed.  (Patient not taking: Reported on 05/12/2020), Disp: , Rfl:   Allergies:  Allergies  Allergen Reactions  . Codeine Nausea And Vomiting and Other (See Comments)    Head feels funny.  . Codeine Sulfate Other (See Comments)    Head feels funny.    Past Medical History, Surgical history, Social history, and Family History were reviewed and updated.  Review of Systems: Review of Systems  Constitutional: Negative.   HENT: Negative.   Eyes: Negative.   Respiratory: Negative.   Cardiovascular: Negative.   Gastrointestinal: Negative.   Genitourinary: Negative.   Musculoskeletal: Positive for joint pain.  Skin: Negative.   Neurological: Negative.   Endo/Heme/Allergies: Negative.   Psychiatric/Behavioral:  Negative.      Physical Exam:  weight is 211 lb 12 oz (96 kg). Her oral temperature is 98.1 F (36.7 C). Her blood pressure is 124/74 and her pulse is 78. Her respiration is 18 and oxygen saturation is 99%.   Wt Readings from Last 3 Encounters:  05/12/20 211 lb 12 oz (96 kg)  11/07/19 212 lb 0.6 oz (96.2 kg)  05/07/19 211 lb (95.7 kg)     Physical Exam Vitals reviewed.  Constitutional:      Comments: Breast exam shows right breast but no masses, edema or erythema.  There is no right axillary adenopathy.  Left chest wall shows well-healed mastectomy.  There is no chest wall nodules or erythema.  There is no tenderness.  She has no left axillary adenopathy.  HENT:     Head: Normocephalic and atraumatic.  Eyes:     Pupils: Pupils  are equal, round, and reactive to light.  Cardiovascular:     Rate and Rhythm: Normal rate and regular rhythm.     Heart sounds: Normal heart sounds.  Pulmonary:     Effort: Pulmonary effort is normal.     Breath sounds: Normal breath sounds.  Abdominal:     General: Bowel sounds are normal.     Palpations: Abdomen is soft.  Musculoskeletal:        General: No tenderness or deformity. Normal range of motion.     Cervical back: Normal range of motion.  Lymphadenopathy:     Cervical: No cervical adenopathy.  Skin:    General: Skin is warm and dry.     Findings: No erythema or rash.  Neurological:     Mental Status: She is alert and oriented to person, place, and time.  Psychiatric:        Behavior: Behavior normal.        Thought Content: Thought content normal.        Judgment: Judgment normal.    Lab Results  Component Value Date   WBC 5.4 05/12/2020   HGB 14.3 05/12/2020   HCT 45.1 05/12/2020   MCV 91.7 05/12/2020   PLT 209 05/12/2020     Chemistry      Component Value Date/Time   NA 142 05/12/2020 0903   NA 143 11/01/2016 1030   NA 141 02/03/2016 1118   K 4.0 05/12/2020 0903   K 4.8 (H) 11/01/2016 1030   K 4.0 02/03/2016 1118   CL 104 05/12/2020 0903   CL 107 11/01/2016 1030   CO2 29 05/12/2020 0903   CO2 30 11/01/2016 1030   CO2 26 02/03/2016 1118   BUN 22 05/12/2020 0903   BUN 25 (H) 11/01/2016 1030   BUN 21.9 02/03/2016 1118   CREATININE 0.87 05/12/2020 0903   CREATININE 0.9 11/01/2016 1030   CREATININE 0.9 02/03/2016 1118      Component Value Date/Time   CALCIUM 10.3 05/12/2020 0903   CALCIUM 10.0 11/01/2016 1030   CALCIUM 10.0 02/03/2016 1118   ALKPHOS 64 05/12/2020 0903   ALKPHOS 82 11/01/2016 1030   ALKPHOS 99 02/03/2016 1118   AST 35 05/12/2020 0903   AST 32 02/03/2016 1118   ALT 24 05/12/2020 0903   ALT 29 11/01/2016 1030   ALT 27 02/03/2016 1118   BILITOT 0.6 05/12/2020 0903   BILITOT 0.36 02/03/2016 1118     Impression and Plan: Ms.  Kimberly [name] is a 80 year old postmenopausal female with stage IIb ductal carcinoma of the left breast. She had one positive lymph  node. She had a very low Oncotype score of 7, so I did not think that there will be a role for chemotherapy.  Again, she will complete her Femara in April.  I really think that 5 years would be reasonable for her.  She will get her Prolia today.  She gets her Prolia every time that we see her.  We will plan to get her back in 6 more months.   Volanda Napoleon, MD 3/22/202210:55 AM

## 2020-05-12 NOTE — Patient Instructions (Signed)
Denosumab injection What is this medicine? DENOSUMAB (den oh sue mab) slows bone breakdown. Prolia is used to treat osteoporosis in women after menopause and in men, and in people who are taking corticosteroids for 6 months or more. Xgeva is used to treat a high calcium level due to cancer and to prevent bone fractures and other bone problems caused by multiple myeloma or cancer bone metastases. Xgeva is also used to treat giant cell tumor of the bone. This medicine may be used for other purposes; ask your health care provider or pharmacist if you have questions. COMMON BRAND NAME(S): Prolia, XGEVA What should I tell my health care provider before I take this medicine? They need to know if you have any of these conditions:  dental disease  having surgery or tooth extraction  infection  kidney disease  low levels of calcium or Vitamin D in the blood  malnutrition  on hemodialysis  skin conditions or sensitivity  thyroid or parathyroid disease  an unusual reaction to denosumab, other medicines, foods, dyes, or preservatives  pregnant or trying to get pregnant  breast-feeding How should I use this medicine? This medicine is for injection under the skin. It is given by a health care professional in a hospital or clinic setting. A special MedGuide will be given to you before each treatment. Be sure to read this information carefully each time. For Prolia, talk to your pediatrician regarding the use of this medicine in children. Special care may be needed. For Xgeva, talk to your pediatrician regarding the use of this medicine in children. While this drug may be prescribed for children as young as 13 years for selected conditions, precautions do apply. Overdosage: If you think you have taken too much of this medicine contact a poison control center or emergency room at once. NOTE: This medicine is only for you. Do not share this medicine with others. What if I miss a dose? It is  important not to miss your dose. Call your doctor or health care professional if you are unable to keep an appointment. What may interact with this medicine? Do not take this medicine with any of the following medications:  other medicines containing denosumab This medicine may also interact with the following medications:  medicines that lower your chance of fighting infection  steroid medicines like prednisone or cortisone This list may not describe all possible interactions. Give your health care provider a list of all the medicines, herbs, non-prescription drugs, or dietary supplements you use. Also tell them if you smoke, drink alcohol, or use illegal drugs. Some items may interact with your medicine. What should I watch for while using this medicine? Visit your doctor or health care professional for regular checks on your progress. Your doctor or health care professional may order blood tests and other tests to see how you are doing. Call your doctor or health care professional for advice if you get a fever, chills or sore throat, or other symptoms of a cold or flu. Do not treat yourself. This drug may decrease your body's ability to fight infection. Try to avoid being around people who are sick. You should make sure you get enough calcium and vitamin D while you are taking this medicine, unless your doctor tells you not to. Discuss the foods you eat and the vitamins you take with your health care professional. See your dentist regularly. Brush and floss your teeth as directed. Before you have any dental work done, tell your dentist you are   receiving this medicine. Do not become pregnant while taking this medicine or for 5 months after stopping it. Talk with your doctor or health care professional about your birth control options while taking this medicine. Women should inform their doctor if they wish to become pregnant or think they might be pregnant. There is a potential for serious side  effects to an unborn child. Talk to your health care professional or pharmacist for more information. What side effects may I notice from receiving this medicine? Side effects that you should report to your doctor or health care professional as soon as possible:  allergic reactions like skin rash, itching or hives, swelling of the face, lips, or tongue  bone pain  breathing problems  dizziness  jaw pain, especially after dental work  redness, blistering, peeling of the skin  signs and symptoms of infection like fever or chills; cough; sore throat; pain or trouble passing urine  signs of low calcium like fast heartbeat, muscle cramps or muscle pain; pain, tingling, numbness in the hands or feet; seizures  unusual bleeding or bruising  unusually weak or tired Side effects that usually do not require medical attention (report to your doctor or health care professional if they continue or are bothersome):  constipation  diarrhea  headache  joint pain  loss of appetite  muscle pain  runny nose  tiredness  upset stomach This list may not describe all possible side effects. Call your doctor for medical advice about side effects. You may report side effects to FDA at 1-800-FDA-1088. Where should I keep my medicine? This medicine is only given in a clinic, doctor's office, or other health care setting and will not be stored at home. NOTE: This sheet is a summary. It may not cover all possible information. If you have questions about this medicine, talk to your doctor, pharmacist, or health care provider.  2021 Elsevier/Gold Standard (2017-06-16 16:10:44)

## 2020-05-13 LAB — CANCER ANTIGEN 27.29: CA 27.29: 39.9 U/mL — ABNORMAL HIGH (ref 0.0–38.6)

## 2020-09-03 ENCOUNTER — Other Ambulatory Visit: Payer: Self-pay | Admitting: Internal Medicine

## 2020-09-03 DIAGNOSIS — Z1231 Encounter for screening mammogram for malignant neoplasm of breast: Secondary | ICD-10-CM

## 2020-10-28 ENCOUNTER — Ambulatory Visit
Admission: RE | Admit: 2020-10-28 | Discharge: 2020-10-28 | Disposition: A | Discharge status: Home / Self Care | Payer: Medicare Other | Source: Ambulatory Visit | Attending: Internal Medicine | Admitting: Internal Medicine

## 2020-10-28 ENCOUNTER — Other Ambulatory Visit: Payer: Self-pay

## 2020-10-28 DIAGNOSIS — Z1231 Encounter for screening mammogram for malignant neoplasm of breast: Secondary | ICD-10-CM

## 2020-11-10 ENCOUNTER — Telehealth: Payer: Self-pay | Admitting: *Deleted

## 2020-11-10 ENCOUNTER — Encounter: Payer: Self-pay | Admitting: Hematology & Oncology

## 2020-11-10 ENCOUNTER — Inpatient Hospital Stay (HOSPITAL_BASED_OUTPATIENT_CLINIC_OR_DEPARTMENT_OTHER): Payer: Medicare Other | Admitting: Hematology & Oncology

## 2020-11-10 ENCOUNTER — Other Ambulatory Visit: Payer: Self-pay

## 2020-11-10 ENCOUNTER — Inpatient Hospital Stay: Payer: Medicare Other

## 2020-11-10 ENCOUNTER — Inpatient Hospital Stay: Payer: Medicare Other | Attending: Hematology & Oncology

## 2020-11-10 VITALS — BP 102/85 | HR 59 | Temp 97.8°F | Resp 18 | Wt 208.8 lb

## 2020-11-10 DIAGNOSIS — C50112 Malignant neoplasm of central portion of left female breast: Secondary | ICD-10-CM

## 2020-11-10 DIAGNOSIS — M818 Other osteoporosis without current pathological fracture: Secondary | ICD-10-CM | POA: Diagnosis not present

## 2020-11-10 DIAGNOSIS — C779 Secondary and unspecified malignant neoplasm of lymph node, unspecified: Secondary | ICD-10-CM | POA: Diagnosis not present

## 2020-11-10 DIAGNOSIS — C50212 Malignant neoplasm of upper-inner quadrant of left female breast: Secondary | ICD-10-CM

## 2020-11-10 DIAGNOSIS — Z79811 Long term (current) use of aromatase inhibitors: Secondary | ICD-10-CM | POA: Insufficient documentation

## 2020-11-10 DIAGNOSIS — C50412 Malignant neoplasm of upper-outer quadrant of left female breast: Secondary | ICD-10-CM | POA: Diagnosis not present

## 2020-11-10 DIAGNOSIS — Z170 Estrogen receptor positive status [ER+]: Secondary | ICD-10-CM

## 2020-11-10 LAB — CMP (CANCER CENTER ONLY)
ALT: 21 U/L (ref 0–44)
AST: 30 U/L (ref 15–41)
Albumin: 4.1 g/dL (ref 3.5–5.0)
Alkaline Phosphatase: 53 U/L (ref 38–126)
Anion gap: 7 (ref 5–15)
BUN: 19 mg/dL (ref 8–23)
CO2: 30 mmol/L (ref 22–32)
Calcium: 9.9 mg/dL (ref 8.9–10.3)
Chloride: 108 mmol/L (ref 98–111)
Creatinine: 0.93 mg/dL (ref 0.44–1.00)
GFR, Estimated: >60 mL/min (ref >60)
Glucose, Bld: 86 mg/dL (ref 70–99)
Potassium: 5.1 mmol/L (ref 3.5–5.1)
Sodium: 145 mmol/L (ref 135–145)
Total Bilirubin: 0.5 mg/dL (ref 0.3–1.2)
Total Protein: 6.8 g/dL (ref 6.5–8.1)

## 2020-11-10 LAB — CBC WITH DIFFERENTIAL (CANCER CENTER ONLY)
Abs Immature Granulocytes: 0.01 10*3/uL (ref 0.00–0.07)
Basophils Absolute: 0.1 10*3/uL (ref 0.0–0.1)
Basophils Relative: 2 %
Eosinophils Absolute: 0.5 10*3/uL (ref 0.0–0.5)
Eosinophils Relative: 11 %
HCT: 42.1 % (ref 36.0–46.0)
Hemoglobin: 13.6 g/dL (ref 12.0–15.0)
Immature Granulocytes: 0 %
Lymphocytes Relative: 31 %
Lymphs Abs: 1.4 10*3/uL (ref 0.7–4.0)
MCH: 29.6 pg (ref 26.0–34.0)
MCHC: 32.3 g/dL (ref 30.0–36.0)
MCV: 91.7 fL (ref 80.0–100.0)
Monocytes Absolute: 0.6 10*3/uL (ref 0.1–1.0)
Monocytes Relative: 13 %
Neutro Abs: 2 10*3/uL (ref 1.7–7.7)
Neutrophils Relative %: 43 %
Platelet Count: 187 10*3/uL (ref 150–400)
RBC: 4.59 MIL/uL (ref 3.87–5.11)
RDW: 14.4 % (ref 11.5–15.5)
WBC Count: 4.6 10*3/uL (ref 4.0–10.5)
nRBC: 0 % (ref 0.0–0.2)

## 2020-11-10 MED ORDER — DENOSUMAB 60 MG/ML ~~LOC~~ SOSY
60.0000 mg | PREFILLED_SYRINGE | Freq: Once | SUBCUTANEOUS | Status: AC
  Administered 2020-11-10: 60 mg via SUBCUTANEOUS
  Filled 2020-11-10: qty 1

## 2020-11-10 NOTE — Patient Instructions (Signed)
Denosumab injection (Prolia) What is this medication? DENOSUMAB (den oh sue mab) slows bone breakdown. Prolia is used to treat osteoporosis in women after menopause and in men, and in people who are taking corticosteroids for 6 months or more. Delton See is used to treat a high calcium level due to cancer and to prevent bone fractures and other bone problems caused by multiple myeloma or cancer bone metastases. Delton See is also used to treat giant cell tumor of the bone. This medicine may be used for other purposes; ask your health care provider or pharmacist if you have questions. COMMON BRAND NAME(S): Prolia, XGEVA What should I tell my care team before I take this medication? They need to know if you have any of these conditions: dental disease having surgery or tooth extraction infection kidney disease low levels of calcium or Vitamin D in the blood malnutrition on hemodialysis skin conditions or sensitivity thyroid or parathyroid disease an unusual reaction to denosumab, other medicines, foods, dyes, or preservatives pregnant or trying to get pregnant breast-feeding How should I use this medication? This medicine is for injection under the skin. It is given by a health care professional in a hospital or clinic setting. A special MedGuide will be given to you before each treatment. Be sure to read this information carefully each time. For Prolia, talk to your pediatrician regarding the use of this medicine in children. Special care may be needed. For Delton See, talk to your pediatrician regarding the use of this medicine in children. While this drug may be prescribed for children as young as 13 years for selected conditions, precautions do apply. Overdosage: If you think you have taken too much of this medicine contact a poison control center or emergency room at once. NOTE: This medicine is only for you. Do not share this medicine with others. What if I miss a dose? It is important not to miss  your dose. Call your doctor or health care professional if you are unable to keep an appointment. What may interact with this medication? Do not take this medicine with any of the following medications: other medicines containing denosumab This medicine may also interact with the following medications: medicines that lower your chance of fighting infection steroid medicines like prednisone or cortisone This list may not describe all possible interactions. Give your health care provider a list of all the medicines, herbs, non-prescription drugs, or dietary supplements you use. Also tell them if you smoke, drink alcohol, or use illegal drugs. Some items may interact with your medicine. What should I watch for while using this medication? Visit your doctor or health care professional for regular checks on your progress. Your doctor or health care professional may order blood tests and other tests to see how you are doing. Call your doctor or health care professional for advice if you get a fever, chills or sore throat, or other symptoms of a cold or flu. Do not treat yourself. This drug may decrease your body's ability to fight infection. Try to avoid being around people who are sick. You should make sure you get enough calcium and vitamin D while you are taking this medicine, unless your doctor tells you not to. Discuss the foods you eat and the vitamins you take with your health care professional. See your dentist regularly. Brush and floss your teeth as directed. Before you have any dental work done, tell your dentist you are receiving this medicine. Do not become pregnant while taking this medicine or for 5 months  after stopping it. Talk with your doctor or health care professional about your birth control options while taking this medicine. Women should inform their doctor if they wish to become pregnant or think they might be pregnant. There is a potential for serious side effects to an unborn child.  Talk to your health care professional or pharmacist for more information. What side effects may I notice from receiving this medication? Side effects that you should report to your doctor or health care professional as soon as possible: allergic reactions like skin rash, itching or hives, swelling of the face, lips, or tongue bone pain breathing problems dizziness jaw pain, especially after dental work redness, blistering, peeling of the skin signs and symptoms of infection like fever or chills; cough; sore throat; pain or trouble passing urine signs of low calcium like fast heartbeat, muscle cramps or muscle pain; pain, tingling, numbness in the hands or feet; seizures unusual bleeding or bruising unusually weak or tired Side effects that usually do not require medical attention (report to your doctor or health care professional if they continue or are bothersome): constipation diarrhea headache joint pain loss of appetite muscle pain runny nose tiredness upset stomach This list may not describe all possible side effects. Call your doctor for medical advice about side effects. You may report side effects to FDA at 1-800-FDA-1088. Where should I keep my medication? This medicine is only given in a clinic, doctor's office, or other health care setting and will not be stored at home. NOTE: This sheet is a summary. It may not cover all possible information. If you have questions about this medicine, talk to your doctor, pharmacist, or health care provider.  2022 Elsevier/Gold Standard (2017-06-16 16:10:44)

## 2020-11-10 NOTE — Progress Notes (Signed)
Hematology and Oncology Follow Up Visit  Kimberly [name] [id number] 20-Oct-1940 80 y.o. 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  Oncotype score is 7  Current Therapy:   Femara 2.5 mg by mouth daily -- completed 5 yrs in 05/2020 Prolia 60 mg subcutaneous every 6 months - next dose in September 2022 S/p XRT - completed in May 2017 - 50.4 Kimberly [name]     Interim History:  Kimberly [name] is back for follow-up.  We see her every 6 months.  She and her husband come in together.  They are doing okay.  She is exercising.  She goes 3 days a week.  She has water aerobics.  Also proud of her for doing this.  She has had no problems with cough or shortness of breath.  There is been no change in bowel or bladder habits.  She has had no rashes.  There is been no fever.  She has had no bleeding.  There is been no leg swelling.  We stopped the Femara in April.  She is doing well off the Femara.  She was has had elevated CA 27.29.  Back in March it was stable at 40.  I suspect this is some kind of inherited type of elevation.  Overall, I would say performance status is ECOG 1.     Medications:  Current Outpatient Medications:    Ascorbic Acid (VITAMIN C) 1000 MG tablet, Take 1,000 mg by mouth daily., Disp: , Rfl:    aspirin EC 81 MG tablet, Take 81 mg by mouth daily., Disp: , Rfl:    calcium carbonate (OS-CAL) 600 MG TABS tablet, Take 600 mg by mouth daily. , Disp: , Rfl:    Cholecalciferol (VITAMIN D) 2000 units CAPS, Take by mouth., Disp: , Rfl:    Coenzyme Q10-Vitamin E (QUNOL ULTRA COQ10 PO), Take 2 capsules by mouth daily., Disp: , Rfl:    Glucosamine-Chondroit-Vit C-Mn (GLUCOSAMINE CHONDR 500 COMPLEX PO), Take by mouth., Disp: , Rfl:    loratadine (CLARITIN) 10 MG tablet, Take 10 mg by mouth daily., Disp: , Rfl:    Multiple Vitamin (MULTI-VITAMINS) TABS, Take by mouth., Disp: , Rfl:    Pyridoxine HCl (VITAMIN B-6 PO), Take by mouth  daily. , Disp: , Rfl:    zinc gluconate 50 MG tablet, Take 50 mg by mouth daily., Disp: , Rfl:    acetaminophen (TYLENOL) 650 MG CR tablet, Take 1,300 mg by mouth. Take 2 pills in the PM (Patient not taking: Reported on 11/10/2020), Disp: , Rfl:    letrozole (FEMARA) 2.5 MG tablet, Take 1 tablet (2.5 mg total) by mouth daily., Disp: 90 tablet, Rfl: 3   meclizine (ANTIVERT) 25 MG tablet, Take by mouth 2 (two) times daily as needed.  (Patient not taking: No sig reported), Disp: , Rfl:    naproxen sodium (ANAPROX) 220 MG tablet, Take 220 mg by mouth daily. (Patient not taking: Reported on 11/10/2020), Disp: , Rfl:   Allergies:  Allergies  Allergen Reactions   Codeine Nausea And Vomiting and Other (See Comments)    Head feels funny.   Codeine Sulfate Other (See Comments)    Head feels funny.    Past Medical History, Surgical history, Social history, and Family History were reviewed and updated.  Review of Systems: Review of Systems  Constitutional: Negative.   HENT: Negative.    Eyes: Negative.   Respiratory: Negative.    Cardiovascular: Negative.   Gastrointestinal:  Negative.   Genitourinary: Negative.   Musculoskeletal:  Positive for joint pain.  Skin: Negative.   Neurological: Negative.   Endo/Heme/Allergies: Negative.   Psychiatric/Behavioral: Negative.      Physical Exam:  weight is 208 lb 12 oz (94.7 kg). Her oral temperature is 97.8 F (36.6 C). Her blood pressure is 102/85 and her pulse is 59 (abnormal). Her respiration is 18 and oxygen saturation is 97%.   Wt Readings from Last 3 Encounters:  11/10/20 208 lb 12 oz (94.7 kg)  05/12/20 211 lb 12 oz (96 kg)  11/07/19 212 lb 0.6 oz (96.2 kg)     Physical Exam Vitals reviewed.  Constitutional:      Comments: Breast exam shows right breast but no masses, edema or erythema.  There is no right axillary adenopathy.  Left chest wall shows well-healed mastectomy.  There is no chest wall nodules or erythema.  There is no  tenderness.  She has no left axillary adenopathy.  HENT:     Head: Normocephalic and atraumatic.  Eyes:     Pupils: Pupils are equal, round, and reactive to light.  Cardiovascular:     Rate and Rhythm: Normal rate and regular rhythm.     Heart sounds: Normal heart sounds.  Pulmonary:     Effort: Pulmonary effort is normal.     Breath sounds: Normal breath sounds.  Abdominal:     General: Bowel sounds are normal.     Palpations: Abdomen is soft.  Musculoskeletal:        General: No tenderness or deformity. Normal range of motion.     Cervical back: Normal range of motion.  Lymphadenopathy:     Cervical: No cervical adenopathy.  Skin:    General: Skin is warm and dry.     Findings: No erythema or rash.  Neurological:     Mental Status: She is alert and oriented to person, place, and time.  Psychiatric:        Behavior: Behavior normal.        Thought Content: Thought content normal.        Judgment: Judgment normal.   Lab Results  Component Value Date   WBC 4.6 11/10/2020   HGB 13.6 11/10/2020   HCT 42.1 11/10/2020   MCV 91.7 11/10/2020   PLT 187 11/10/2020     Chemistry      Component Value Date/Time   NA 145 11/10/2020 0748   NA 143 11/01/2016 1030   NA 141 02/03/2016 1118   K 5.1 11/10/2020 0748   K 4.8 (H) 11/01/2016 1030   K 4.0 02/03/2016 1118   CL 108 11/10/2020 0748   CL 107 11/01/2016 1030   CO2 30 11/10/2020 0748   CO2 30 11/01/2016 1030   CO2 26 02/03/2016 1118   BUN 19 11/10/2020 0748   BUN 25 (H) 11/01/2016 1030   BUN 21.9 02/03/2016 1118   CREATININE 0.93 11/10/2020 0748   CREATININE 0.9 11/01/2016 1030   CREATININE 0.9 02/03/2016 1118      Component Value Date/Time   CALCIUM 9.9 11/10/2020 0748   CALCIUM 10.0 11/01/2016 1030   CALCIUM 10.0 02/03/2016 1118   ALKPHOS 53 11/10/2020 0748   ALKPHOS 82 11/01/2016 1030   ALKPHOS 99 02/03/2016 1118   AST 30 11/10/2020 0748   AST 32 02/03/2016 1118   ALT 21 11/10/2020 0748   ALT 29 11/01/2016  1030   ALT 27 02/03/2016 1118   BILITOT 0.5 11/10/2020 0748   BILITOT 0.36 02/03/2016  70     Impression and Plan: Kimberly [name] is a 81 year old postmenopausal female with stage IIb ductal carcinoma of the left breast. She had one positive lymph node. She had a very low Oncotype score of 7, so I did not think that there will be a role for chemotherapy.  We will just follow her along now.  She gets her Prolia today.  Provide I will see that we have to do any scans on her.  Again, the CA 27.29 is always been elevated with her.  Unless there is a significant elevation from her baseline, I really do not think that we have to do any radiographic studies.  We will plan to get her back in 6 months.   Volanda Napoleon, MD 9/20/20228:20 AM

## 2020-11-10 NOTE — Telephone Encounter (Signed)
Per 11/10/20 los gave upcoming appointments - confirmed - print calendar

## 2020-11-11 LAB — CANCER ANTIGEN 27.29: CA 27.29: 36 U/mL (ref 0.0–38.6)

## 2021-05-11 ENCOUNTER — Inpatient Hospital Stay: Payer: Medicare Other

## 2021-05-11 ENCOUNTER — Other Ambulatory Visit: Payer: Self-pay

## 2021-05-11 ENCOUNTER — Encounter: Payer: Self-pay | Admitting: Hematology & Oncology

## 2021-05-11 ENCOUNTER — Inpatient Hospital Stay: Payer: Medicare Other | Attending: Hematology & Oncology

## 2021-05-11 ENCOUNTER — Inpatient Hospital Stay (HOSPITAL_BASED_OUTPATIENT_CLINIC_OR_DEPARTMENT_OTHER): Payer: Medicare Other | Admitting: Hematology & Oncology

## 2021-05-11 DIAGNOSIS — C50412 Malignant neoplasm of upper-outer quadrant of left female breast: Secondary | ICD-10-CM | POA: Insufficient documentation

## 2021-05-11 DIAGNOSIS — C50212 Malignant neoplasm of upper-inner quadrant of left female breast: Secondary | ICD-10-CM

## 2021-05-11 DIAGNOSIS — C779 Secondary and unspecified malignant neoplasm of lymph node, unspecified: Secondary | ICD-10-CM | POA: Diagnosis present

## 2021-05-11 DIAGNOSIS — C50112 Malignant neoplasm of central portion of left female breast: Secondary | ICD-10-CM | POA: Diagnosis not present

## 2021-05-11 DIAGNOSIS — Z79811 Long term (current) use of aromatase inhibitors: Secondary | ICD-10-CM | POA: Insufficient documentation

## 2021-05-11 DIAGNOSIS — M818 Other osteoporosis without current pathological fracture: Secondary | ICD-10-CM

## 2021-05-11 LAB — CMP (CANCER CENTER ONLY)
ALT: 17 U/L (ref 0–44)
AST: 24 U/L (ref 15–41)
Albumin: 4.1 g/dL (ref 3.5–5.0)
Alkaline Phosphatase: 75 U/L (ref 38–126)
Anion gap: 8 (ref 5–15)
BUN: 25 mg/dL — ABNORMAL HIGH (ref 8–23)
CO2: 30 mmol/L (ref 22–32)
Calcium: 10.6 mg/dL — ABNORMAL HIGH (ref 8.9–10.3)
Chloride: 108 mmol/L (ref 98–111)
Creatinine: 0.99 mg/dL (ref 0.44–1.00)
GFR, Estimated: 58 mL/min — ABNORMAL LOW (ref >60)
Glucose, Bld: 90 mg/dL (ref 70–99)
Potassium: 4.9 mmol/L (ref 3.5–5.1)
Sodium: 146 mmol/L — ABNORMAL HIGH (ref 135–145)
Total Bilirubin: 0.5 mg/dL (ref 0.3–1.2)
Total Protein: 7.4 g/dL (ref 6.5–8.1)

## 2021-05-11 LAB — CBC WITH DIFFERENTIAL (CANCER CENTER ONLY)
Abs Immature Granulocytes: 0.02 10*3/uL (ref 0.00–0.07)
Basophils Absolute: 0.1 10*3/uL (ref 0.0–0.1)
Basophils Relative: 2 %
Eosinophils Absolute: 0.5 10*3/uL (ref 0.0–0.5)
Eosinophils Relative: 8 %
HCT: 42.6 % (ref 36.0–46.0)
Hemoglobin: 13.3 g/dL (ref 12.0–15.0)
Immature Granulocytes: 0 %
Lymphocytes Relative: 27 %
Lymphs Abs: 1.6 10*3/uL (ref 0.7–4.0)
MCH: 28.5 pg (ref 26.0–34.0)
MCHC: 31.2 g/dL (ref 30.0–36.0)
MCV: 91.2 fL (ref 80.0–100.0)
Monocytes Absolute: 0.8 10*3/uL (ref 0.1–1.0)
Monocytes Relative: 13 %
Neutro Abs: 3.1 10*3/uL (ref 1.7–7.7)
Neutrophils Relative %: 50 %
Platelet Count: 212 10*3/uL (ref 150–400)
RBC: 4.67 MIL/uL (ref 3.87–5.11)
RDW: 14.7 % (ref 11.5–15.5)
WBC Count: 6.1 10*3/uL (ref 4.0–10.5)
nRBC: 0 % (ref 0.0–0.2)

## 2021-05-11 MED ORDER — DENOSUMAB 60 MG/ML ~~LOC~~ SOSY
60.0000 mg | PREFILLED_SYRINGE | Freq: Once | SUBCUTANEOUS | Status: AC
  Administered 2021-05-11: 60 mg via SUBCUTANEOUS
  Filled 2021-05-11: qty 1

## 2021-05-11 NOTE — Patient Instructions (Signed)
Denosumab injection ?What is this medication? ?DENOSUMAB (den oh sue mab) slows bone breakdown. Prolia is used to treat osteoporosis in women after menopause and in men, and in people who are taking corticosteroids for 6 months or more. Xgeva is used to treat a high calcium level due to cancer and to prevent bone fractures and other bone problems caused by multiple myeloma or cancer bone metastases. Xgeva is also used to treat giant cell tumor of the bone. ?This medicine may be used for other purposes; ask your health care provider or pharmacist if you have questions. ?COMMON BRAND NAME(S): Prolia, XGEVA ?What should I tell my care team before I take this medication? ?They need to know if you have any of these conditions: ?dental disease ?having surgery or tooth extraction ?infection ?kidney disease ?low levels of calcium or Vitamin D in the blood ?malnutrition ?on hemodialysis ?skin conditions or sensitivity ?thyroid or parathyroid disease ?an unusual reaction to denosumab, other medicines, foods, dyes, or preservatives ?pregnant or trying to get pregnant ?breast-feeding ?How should I use this medication? ?This medicine is for injection under the skin. It is given by a health care professional in a hospital or clinic setting. ?A special MedGuide will be given to you before each treatment. Be sure to read this information carefully each time. ?For Prolia, talk to your pediatrician regarding the use of this medicine in children. Special care may be needed. For Xgeva, talk to your pediatrician regarding the use of this medicine in children. While this drug may be prescribed for children as young as 13 years for selected conditions, precautions do apply. ?Overdosage: If you think you have taken too much of this medicine contact a poison control center or emergency room at once. ?NOTE: This medicine is only for you. Do not share this medicine with others. ?What if I miss a dose? ?It is important not to miss your dose.  Call your doctor or health care professional if you are unable to keep an appointment. ?What may interact with this medication? ?Do not take this medicine with any of the following medications: ?other medicines containing denosumab ?This medicine may also interact with the following medications: ?medicines that lower your chance of fighting infection ?steroid medicines like prednisone or cortisone ?This list may not describe all possible interactions. Give your health care provider a list of all the medicines, herbs, non-prescription drugs, or dietary supplements you use. Also tell them if you smoke, drink alcohol, or use illegal drugs. Some items may interact with your medicine. ?What should I watch for while using this medication? ?Visit your doctor or health care professional for regular checks on your progress. Your doctor or health care professional may order blood tests and other tests to see how you are doing. ?Call your doctor or health care professional for advice if you get a fever, chills or sore throat, or other symptoms of a cold or flu. Do not treat yourself. This drug may decrease your body's ability to fight infection. Try to avoid being around people who are sick. ?You should make sure you get enough calcium and vitamin D while you are taking this medicine, unless your doctor tells you not to. Discuss the foods you eat and the vitamins you take with your health care professional. ?See your dentist regularly. Brush and floss your teeth as directed. Before you have any dental work done, tell your dentist you are receiving this medicine. ?Do not become pregnant while taking this medicine or for 5 months after   stopping it. Talk with your doctor or health care professional about your birth control options while taking this medicine. Women should inform their doctor if they wish to become pregnant or think they might be pregnant. There is a potential for serious side effects to an unborn child. Talk to  your health care professional or pharmacist for more information. ?What side effects may I notice from receiving this medication? ?Side effects that you should report to your doctor or health care professional as soon as possible: ?allergic reactions like skin rash, itching or hives, swelling of the face, lips, or tongue ?bone pain ?breathing problems ?dizziness ?jaw pain, especially after dental work ?redness, blistering, peeling of the skin ?signs and symptoms of infection like fever or chills; cough; sore throat; pain or trouble passing urine ?signs of low calcium like fast heartbeat, muscle cramps or muscle pain; pain, tingling, numbness in the hands or feet; seizures ?unusual bleeding or bruising ?unusually weak or tired ?Side effects that usually do not require medical attention (report to your doctor or health care professional if they continue or are bothersome): ?constipation ?diarrhea ?headache ?joint pain ?loss of appetite ?muscle pain ?runny nose ?tiredness ?upset stomach ?This list may not describe all possible side effects. Call your doctor for medical advice about side effects. You may report side effects to FDA at 1-800-FDA-1088. ?Where should I keep my medication? ?This medicine is only given in a clinic, doctor's office, or other health care setting and will not be stored at home. ?NOTE: This sheet is a summary. It may not cover all possible information. If you have questions about this medicine, talk to your doctor, pharmacist, or health care provider. ?? 2022 Elsevier/Gold Standard (2017-06-16 00:00:00) ? ?

## 2021-05-11 NOTE — Progress Notes (Addendum)
?Hematology and Oncology Follow Up Visit ? ?Elyse Hsu ?[id number] ?1940-05-27 81 y.o. ?05/11/2021 ? ? 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  Oncotype score is 7 ? ?Current Therapy:   ?Femara 2.5 mg by mouth daily -- completed 5 yrs in 05/2020 ?Prolia 60 mg subcutaneous every 6 months - next dose in September 2023 ?S/p XRT - completed in May 2017 - 50.4 [name] ?    ?Interim History:  Ms. Blizard is back for follow-up.  So far, everything is going quite well for her.  We saw her 6 months ago.  She is doing nicely.  She staying active.  She had no problems over the holiday season.  ? ?She and her husband did have COVID.  This was the end of September of last year.  Thankfully, they really not got sick.  They did take the oral medication for 5 days.   ? ?She has had no problems with change in bowel or bladder habits.  She has had no nausea or vomiting.  She has had no rashes. ? ?Her CA 27.9 has always been on the higher side.  When we last saw her, it was 58.  We have never found any evidence of metastatic disease.   ? ?Her last mammogram was done back in September 2022.  Everything looked fine. ? ?Overall, I would have to say that her performance status is ECOG 1.  ? ?Medications:  ?Current Outpatient Medications:  ?  acetaminophen (TYLENOL) 650 MG CR tablet, Take 1,300 mg by mouth. Take 2 pills in the PM, Disp: , Rfl:  ?  Ascorbic Acid (VITAMIN C) 1000 MG tablet, Take 1,000 mg by mouth daily., Disp: , Rfl:  ?  aspirin EC 81 MG tablet, Take 81 mg by mouth daily., Disp: , Rfl:  ?  calcium carbonate (OS-CAL) 600 MG TABS tablet, Take 600 mg by mouth daily. , Disp: , Rfl:  ?  Cholecalciferol (VITAMIN D) 2000 units CAPS, Take by mouth., Disp: , Rfl:  ?  Coenzyme Q10-Vitamin E (QUNOL ULTRA COQ10 PO), Take 2 capsules by mouth daily., Disp: , Rfl:  ?  Glucosamine-Chondroit-Vit C-Mn (GLUCOSAMINE CHONDR 500 COMPLEX PO), Take by mouth., Disp: , Rfl:  ?  loratadine  (CLARITIN) 10 MG tablet, Take 10 mg by mouth daily., Disp: , Rfl:  ?  Multiple Vitamin (MULTI-VITAMINS) TABS, Take by mouth., Disp: , Rfl:  ?  Pyridoxine HCl (VITAMIN B-6 PO), Take by mouth daily. , Disp: , Rfl:  ?  zinc gluconate 50 MG tablet, Take 50 mg by mouth daily., Disp: , Rfl:  ?  letrozole (FEMARA) 2.5 MG tablet, Take 1 tablet (2.5 mg total) by mouth daily., Disp: 90 tablet, Rfl: 3 ?  meclizine (ANTIVERT) 25 MG tablet, Take by mouth 2 (two) times daily as needed.  (Patient not taking: Reported on 05/12/2020), Disp: , Rfl:  ?  naproxen sodium (ANAPROX) 220 MG tablet, Take 220 mg by mouth daily., Disp: , Rfl:  ? ?Allergies:  ?Allergies  ?Allergen Reactions  ? Codeine Nausea And Vomiting and Other (See Comments)  ?  Head feels funny.  ? Codeine Sulfate Other (See Comments)  ?  Head feels funny.  ? ? ?Past Medical History, Surgical history, Social history, and Family History were reviewed and updated. ? ?Review of Systems: ?Review of Systems  ?Constitutional: Negative.   ?HENT: Negative.    ?Eyes: Negative.   ?Respiratory: Negative.    ?Cardiovascular: Negative.   ?Gastrointestinal:  Negative.   ?Genitourinary: Negative.   ?Musculoskeletal:  Positive for joint pain.  ?Skin: Negative.   ?Neurological: Negative.   ?Endo/Heme/Allergies: Negative.   ?Psychiatric/Behavioral: Negative.    ? ? ?Physical Exam: ? vitals were not taken for this visit.  ? ?Wt Readings from Last 3 Encounters:  ?11/10/20 208 lb 12 oz (94.7 kg)  ?05/12/20 211 lb 12 oz (96 kg)  ?11/07/19 212 lb 0.6 oz (96.2 kg)  ? ? ? ?Physical Exam ?Vitals reviewed.  ?Constitutional:   ?   Comments: Breast exam shows right breast but no masses, edema or erythema.  There is no right axillary adenopathy.  Left chest wall shows well-healed mastectomy.  There is no chest wall nodules or erythema.  There is no tenderness.  She has no left axillary adenopathy.  ?HENT:  ?   Head: Normocephalic and atraumatic.  ?Eyes:  ?   Pupils: Pupils are equal, round, and reactive  to light.  ?Cardiovascular:  ?   Rate and Rhythm: Normal rate and regular rhythm.  ?   Heart sounds: Normal heart sounds.  ?Pulmonary:  ?   Effort: Pulmonary effort is normal.  ?   Breath sounds: Normal breath sounds.  ?Abdominal:  ?   General: Bowel sounds are normal.  ?   Palpations: Abdomen is soft.  ?Musculoskeletal:     ?   General: No tenderness or deformity. Normal range of motion.  ?   Cervical back: Normal range of motion.  ?Lymphadenopathy:  ?   Cervical: No cervical adenopathy.  ?Skin: ?   General: Skin is warm and dry.  ?   Findings: No erythema or rash.  ?Neurological:  ?   Mental Status: She is alert and oriented to person, place, and time.  ?Psychiatric:     ?   Behavior: Behavior normal.     ?   Thought Content: Thought content normal.     ?   Judgment: Judgment normal.  ? ?Lab Results  ?Component Value Date  ? WBC 6.1 05/11/2021  ? HGB 13.3 05/11/2021  ? HCT 42.6 05/11/2021  ? MCV 91.2 05/11/2021  ? PLT 212 05/11/2021  ? ?  Chemistry   ?   ?Component Value Date/Time  ? NA 146 (H) 05/11/2021 0757  ? NA 143 11/01/2016 1030  ? NA 141 02/03/2016 1118  ? K 4.9 05/11/2021 0757  ? K 4.8 (H) 11/01/2016 1030  ? K 4.0 02/03/2016 1118  ? CL 108 05/11/2021 0757  ? CL 107 11/01/2016 1030  ? CO2 30 05/11/2021 0757  ? CO2 30 11/01/2016 1030  ? CO2 26 02/03/2016 1118  ? BUN 25 (H) 05/11/2021 0757  ? BUN 25 (H) 11/01/2016 1030  ? BUN 21.9 02/03/2016 1118  ? CREATININE 0.99 05/11/2021 0757  ? CREATININE 0.9 11/01/2016 1030  ? CREATININE 0.9 02/03/2016 1118  ?    ?Component Value Date/Time  ? CALCIUM 10.6 (H) 05/11/2021 0757  ? CALCIUM 10.0 11/01/2016 1030  ? CALCIUM 10.0 02/03/2016 1118  ? ALKPHOS 75 05/11/2021 0757  ? ALKPHOS 82 11/01/2016 1030  ? ALKPHOS 99 02/03/2016 1118  ? AST 24 05/11/2021 0757  ? AST 32 02/03/2016 1118  ? ALT 17 05/11/2021 0757  ? ALT 29 11/01/2016 1030  ? ALT 27 02/03/2016 1118  ? BILITOT 0.5 05/11/2021 0757  ? BILITOT 0.36 02/03/2016 1118  ?  ? ?Impression and Plan: ?Ms. Dunigan is a  81 year old postmenopausal female with stage IIb ductal carcinoma of the left breast. She had  one positive lymph node. She had a very low Oncotype score of 7, so I did not think that there will be a role for chemotherapy. ? ?We will just follow her along now.  She gets her Prolia today.   ? ?Again, the CA 27.29 is always been elevated with her.  Unless there is a significant elevation from her baseline, I really do not think that we have to do any radiographic studies. ? ?We will plan to get her back in 6 months. ? ? ?Volanda Napoleon, MD ?3/21/20238:59 AM ?

## 2021-05-12 LAB — CANCER ANTIGEN 27.29: CA 27.29: 49.9 U/mL — ABNORMAL HIGH (ref 0.0–38.6)

## 2021-09-27 ENCOUNTER — Other Ambulatory Visit: Payer: Self-pay | Admitting: Internal Medicine

## 2021-09-27 DIAGNOSIS — Z1231 Encounter for screening mammogram for malignant neoplasm of breast: Secondary | ICD-10-CM

## 2021-11-01 ENCOUNTER — Ambulatory Visit
Admission: RE | Admit: 2021-11-01 | Discharge: 2021-11-01 | Disposition: A | Discharge status: Home / Self Care | Payer: Medicare Other | Source: Ambulatory Visit | Attending: Internal Medicine | Admitting: Internal Medicine

## 2021-11-01 ENCOUNTER — Encounter: Payer: Self-pay | Admitting: Emergency Medicine

## 2021-11-01 ENCOUNTER — Ambulatory Visit
Admission: EM | Admit: 2021-11-01 | Discharge: 2021-11-01 | Disposition: A | Discharge status: Home / Self Care | Payer: Medicare Other | Attending: Physician Assistant | Admitting: Physician Assistant

## 2021-11-01 ENCOUNTER — Other Ambulatory Visit: Payer: Self-pay

## 2021-11-01 DIAGNOSIS — M79641 Pain in right hand: Secondary | ICD-10-CM

## 2021-11-01 DIAGNOSIS — R2231 Localized swelling, mass and lump, right upper limb: Secondary | ICD-10-CM

## 2021-11-01 DIAGNOSIS — L03113 Cellulitis of right upper limb: Secondary | ICD-10-CM | POA: Diagnosis not present

## 2021-11-01 DIAGNOSIS — Z1231 Encounter for screening mammogram for malignant neoplasm of breast: Secondary | ICD-10-CM

## 2021-11-01 MED ORDER — CEFDINIR 300 MG PO CAPS: 300.0000 mg | ORAL_CAPSULE | Freq: Two times a day (BID) | ORAL | 0 refills | Status: AC

## 2021-11-01 MED ORDER — PREDNISONE 10 MG PO TABS: ORAL_TABLET | ORAL | 0 refills | Status: DC

## 2021-11-01 NOTE — Discharge Instructions (Addendum)
-  We discussed the different potential causes of your condition which would include localized reaction to an insect bite or sting, arthritis flareup, or infection.  The prednisone will cover you for possible insect bite and arthritis flareup and the antibiotic will cover potential infection as I am unsure as to the cause of your symptoms. - Ice the hand and elevated. - It should be improving over the next 24 hours but if you feel that it worsens or you develop increased pain, fever or start to feel ill, you should return or go to emergency department for reevaluation.

## 2021-11-01 NOTE — ED Provider Notes (Signed)
[facility]    CSN: [id number] Arrival date & time: 11/01/21  1011      History   Chief Complaint Chief Complaint  Patient presents with   Hand Pain    right    HPI Kimberly [name] is a 81 y.o. female presenting for approximately 3-day history of dorsal right hand redness, swelling, pain and increased warmth.  She denies injury.  She does not recall getting stung by an insect.  She denies any similar symptoms in the past.  Does have history of arthritis and says no history of gout.  She says current symptoms started in the pinky finger and then it spread from there.  Patient says she iced the hand but she is unsure if it helped.  She has not been taking any medicine for the symptoms.  She denies fever or feeling ill.  No other complaints.  HPI  Past Medical History:  Diagnosis Date   Arthritis    Breast cancer ([place]) 09/10/14   left breast   Complication of anesthesia    has issues with inner ear, usually gets patch behind ear and use nausea medicine to prevent inner ear dizziness   DDD (degenerative disc disease), lumbar    Degenerative joint disease    History of measles    History of mumps    History of radiation therapy    28 treatments   Inner ear dysfunction    Kidney stones    Menopause    Personal history of chemotherapy 2016   Personal history of radiation therapy 2016   PONV (postoperative nausea and vomiting)    Seizures ([facility])    hx of one seizure in 1963--no known reason for the seizure and none since   Seroma    left breast   Tinnitus    Vertigo    1 episode following surgery     Patient Active Problem List   Diagnosis Date Noted   Other osteoporosis without current pathological fracture 04/27/2016   Encounter for adjustment or removal of unspecified breast implant 03/17/2015   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 01/22/2015   Malignant neoplasm of central portion of left female breast ([place]) 01/05/2015   Calculus of kidney  11/20/2014   Post menopausal syndrome 11/20/2014   Breast cancer ([place]) 11/20/2014   OA (osteoarthritis) of knee 05/14/2012    Past Surgical History:  Procedure Laterality Date   BREAST RECONSTRUCTION WITH PLACEMENT OF TISSUE EXPANDER AND FLEX HD (ACELLULAR HYDRATED DERMIS) Left 11/20/2014   Procedure: LEFT BREAST RECONSTRUCTION WITH PLACEMENT OF TISSUE EXPANDER AND FLEX HD (ACELLULAR HYDRATED DERMIS) ;  Surgeon: [name], DO;  Location: [place];  Service: Plastics;  Laterality: Left;   BREAST SURGERY  1997   RIGHT BREAST BIOPSY times 2   CAPSULECTOMY Left 03/09/2015   Procedure: CAPSULECTOMY LEFT BREAST;  Surgeon: [name], DO;  Location: [place];  Service: Plastics;  Laterality: Left;   CHOLECYSTECTOMY     COLONOSCOPY     INCISION AND DRAINAGE OF WOUND Left 12/12/2014   Procedure: LEFT BREAST DRAINAGE OF SEROMA  AND EXCISION OF EXCESS SKIN;  Surgeon: [name], DO;  Location: [place];  Service: Plastics;  Laterality: Left;   INCISION AND DRAINAGE OF WOUND Left 01/22/2015   Procedure: IRRIGATION AND DEBRIDEMENT OF LEFT BREAST POCKET WITH REMOVAL TISSUE EXPANDER AND REPLACEMENT EXPANDER, POSSIBLE ACELLULAR DERMAL MATRIX PLACEMENT;  Surgeon: Loel Lofty Dillingham, DO;  Location: [facility]  OR;  Service: Clinical cytogeneticist;  Laterality: Left;   LITHOTRIPSY     MASTECTOMY Left 2016   MASTECTOMY W/ SENTINEL NODE BIOPSY Left 11/20/2014   Procedure: MASTECTOMY WITH SENTINEL LYMPH NODE BIOPSY;  Surgeon: Alphonsa Overall, MD;  Location: [place];  Service: General;  Laterality: Left;   REMOVAL OF TISSUE EXPANDER Left 03/09/2015   Procedure: REMOVAL OF LEFT BREAST TISSUE EXPANDER AND PLACEMENT OF DRAIN ;  Surgeon: [name], DO;  Location: [place];  Service: Plastics;  Laterality: Left;   TOTAL KNEE ARTHROPLASTY Right 05/14/2012   Procedure: TOTAL KNEE ARTHROPLASTY;  Surgeon: Gearlean Alf, MD;   Location: [facility];  Service: Orthopedics;  Laterality: Right;   TOTAL KNEE ARTHROPLASTY Left 09/16/2015   Procedure: LEFT TOTAL KNEE ARTHROPLASTY;  Surgeon: Gaynelle Arabian, MD;  Location: [facility];  Service: Orthopedics;  Laterality: Left;   TUBAL LIGATION      OB History   No obstetric history on file.      Home Medications    Prior to Admission medications   Medication Sig Start Date End Date Taking? Authorizing Provider  acetaminophen (TYLENOL) 650 MG CR tablet Take 1,300 mg by mouth. Take 2 pills in the PM   Yes [provider]  Ascorbic Acid (VITAMIN C) 1000 MG tablet Take 1,000 mg by mouth daily.   Yes [provider]  aspirin EC 81 MG tablet Take 81 mg by mouth daily.   Yes [provider]  calcium carbonate (OS-CAL) 600 MG TABS tablet Take 600 mg by mouth daily.    Yes [provider]  cefdinir (OMNICEF) 300 MG capsule Take 1 capsule (300 mg total) by mouth 2 (two) times daily for 7 days. 11/01/21 11/08/21 Yes Danton Clap, PA-C  Cholecalciferol (VITAMIN D) 2000 units CAPS Take by mouth.   Yes [provider]  Coenzyme Q10-Vitamin E (QUNOL ULTRA COQ10 PO) Take 2 capsules by mouth daily.   Yes [provider]  Glucosamine-Chondroit-Vit C-Mn (GLUCOSAMINE CHONDR 500 COMPLEX PO) Take by mouth.   Yes [provider]  loratadine (CLARITIN) 10 MG tablet Take 10 mg by mouth daily.   Yes [provider]  meclizine (ANTIVERT) 25 MG tablet Take by mouth 2 (two) times daily as needed.   Yes [provider]  Multiple Vitamin (MULTI-VITAMINS) TABS Take by mouth.   Yes [provider]  naproxen sodium (ANAPROX) 220 MG tablet Take 220 mg by mouth daily.   Yes [provider]  predniSONE (DELTASONE) 10 MG tablet Take 5 tabs p.o. on day 1 and decrease by 1 tablet daily until complete 11/01/21  Yes Laurene Footman B, PA-C  Pyridoxine HCl (VITAMIN B-6 PO) Take by mouth daily.    Yes [provider]   zinc gluconate 50 MG tablet Take 50 mg by mouth daily.   Yes [provider]  letrozole (FEMARA) 2.5 MG tablet Take 1 tablet (2.5 mg total) by mouth daily. 03/11/20   Volanda Napoleon, MD    Family History Family History  Problem Relation Age of Onset   Lung cancer Father    Arthritis Mother    Pneumonia Mother    Congestive Heart Failure Mother    Transient ischemic attack Mother    Breast cancer Maternal Aunt    Cancer Son        ewing's sarcoma   Lung cancer Maternal Aunt     Social History Social History   Tobacco Use   Smoking  status: Never   Smokeless tobacco: Never  Vaping Use   Vaping Use: Never used  Substance Use Topics   Alcohol use: No    Alcohol/week: 0.0 standard drinks of alcohol   Drug use: No     Allergies   Codeine and Codeine sulfate   Review of Systems Review of Systems  Constitutional:  Negative for fatigue and fever.  Musculoskeletal:  Positive for arthralgias and joint swelling.  Skin:  Positive for color change and rash. Negative for wound.  Neurological:  Negative for weakness and numbness.     Physical Exam Triage Vital Signs ED Triage Vitals  Enc Vitals Group     BP      Pulse      Resp      Temp      Temp src      SpO2      Weight      Height      Head Circumference      Peak Flow      Pain Score      Pain Loc      Pain Edu?      Excl. in [place]?    No data found.  Updated Vital Signs BP 128/78 (BP Location: Left Arm)   Pulse 63   Temp 98.3 F (36.8 C) (Oral)   Resp 16   Ht '5\' 4"'$  (1.626 m)   Wt 208 lb 12.4 oz (94.7 kg)   SpO2 99%   BMI 35.84 kg/m   Physical Exam Vitals and nursing note reviewed.  Constitutional:      General: She is not in acute distress.    Appearance: Normal appearance. She is not ill-appearing or toxic-appearing.  HENT:     Head: Normocephalic and atraumatic.  Eyes:     General: No scleral icterus.       Right eye: No discharge.        Left eye: No discharge.      Conjunctiva/sclera: Conjunctivae normal.  Cardiovascular:     Rate and Rhythm: Normal rate and regular rhythm.     Pulses: Normal pulses.  Pulmonary:     Effort: Pulmonary effort is normal. No respiratory distress.  Musculoskeletal:     Cervical back: Neck supple.     Comments: Right hand: There is diffuse erythema, warmth and mild to moderate swelling of the dorsal right hand.  She has tenderness palpation over the fifth metatarsal and fourth metatarsal.  She has some difficulty making a fist due to swelling and discomfort.  There are no lacerations, abrasions or open wounds.  No blisters, papules or obvious insect bites.  Skin:    General: Skin is dry.  Neurological:     General: No focal deficit present.     Mental Status: She is alert. Mental status is at baseline.     Motor: No weakness.     Gait: Gait normal.  Psychiatric:        Mood and Affect: Mood normal.        Behavior: Behavior normal.        Thought Content: Thought content normal.      UC Treatments / Results  Labs (all labs ordered are listed, but only abnormal results are displayed) Labs Reviewed - No data to display  EKG   Radiology No results found.  Procedures Procedures (including critical care time)  Medications Ordered in UC Medications - No data to display  Initial Impression / Assessment and Plan / UC  Course  I have reviewed the triage vital signs and the nursing notes.  Pertinent labs & imaging results that were available during my care of the patient were reviewed by me and considered in my medical decision making (see chart for details).   81 year old female presenting for right hand pain, swelling and increased warmth for the past couple days.  Denies injury, known insect bite or sting or similar problems in the past related to arthritis.  No history of gout.  Has iced the hand.  Vitals normal and stable and she is overall well-appearing.  On exam, she has increased swelling, warmth and  erythema of the right dorsal hand.  Some tenderness to palpation over the fourth and fifth metatarsals.  Good pulses and strength.  Advised patient I am uncertain as to the cause for symptoms but differential diagnosis would include localized reaction to insect bite, cellulitis, and flareup of arthritis.  Discussed with patient treating her for all conditions with prednisone and cefdinir as well as encourage cryotherapy and elevation of extremity.  Patient would like to do this.  Encouraged close monitoring and advised her to return if no improvement in 24 hours or worsening of symptoms.  Advised emergency department if fever, feeling ill or significant worsening of symptoms.   Final Clinical Impressions(s) / UC Diagnoses   Final diagnoses:  Right hand pain  Localized swelling on right hand  Cellulitis of right hand     Discharge Instructions      -We discussed the different potential causes of your condition which would include localized reaction to an insect bite or sting, arthritis flareup, or infection.  The prednisone will cover you for possible insect bite and arthritis flareup and the antibiotic will cover potential infection as I am unsure as to the cause of your symptoms. - Ice the hand and elevated. - It should be improving over the next 24 hours but if you feel that it worsens or you develop increased pain, fever or start to feel ill, you should return or go to emergency department for reevaluation.     ED Prescriptions     Medication Sig Dispense Auth. Provider   predniSONE (DELTASONE) 10 MG tablet Take 5 tabs p.o. on day 1 and decrease by 1 tablet daily until complete 15 tablet Laurene Footman B, PA-C   cefdinir (OMNICEF) 300 MG capsule Take 1 capsule (300 mg total) by mouth 2 (two) times daily for 7 days. 14 capsule Danton Clap, PA-C      PDMP not reviewed this encounter.   Danton Clap, PA-C 11/01/21 1135

## 2021-11-01 NOTE — ED Triage Notes (Signed)
Pt c/o right hand pain, swelling and redness. Started about 3 days ago. No known injury.

## 2021-11-09 ENCOUNTER — Inpatient Hospital Stay (HOSPITAL_BASED_OUTPATIENT_CLINIC_OR_DEPARTMENT_OTHER): Payer: Medicare Other | Admitting: Hematology & Oncology

## 2021-11-09 ENCOUNTER — Encounter: Payer: Self-pay | Admitting: Hematology & Oncology

## 2021-11-09 ENCOUNTER — Inpatient Hospital Stay: Payer: Medicare Other | Attending: Hematology & Oncology

## 2021-11-09 ENCOUNTER — Inpatient Hospital Stay: Payer: Medicare Other

## 2021-11-09 VITALS — BP 120/79 | HR 63 | Temp 98.1°F | Resp 18 | Ht 66.0 in | Wt 206.8 lb

## 2021-11-09 DIAGNOSIS — Z79811 Long term (current) use of aromatase inhibitors: Secondary | ICD-10-CM | POA: Insufficient documentation

## 2021-11-09 DIAGNOSIS — C50112 Malignant neoplasm of central portion of left female breast: Secondary | ICD-10-CM

## 2021-11-09 DIAGNOSIS — C50912 Malignant neoplasm of unspecified site of left female breast: Secondary | ICD-10-CM | POA: Insufficient documentation

## 2021-11-09 DIAGNOSIS — C773 Secondary and unspecified malignant neoplasm of axilla and upper limb lymph nodes: Secondary | ICD-10-CM | POA: Diagnosis present

## 2021-11-09 DIAGNOSIS — C50212 Malignant neoplasm of upper-inner quadrant of left female breast: Secondary | ICD-10-CM

## 2021-11-09 LAB — CBC WITH DIFFERENTIAL (CANCER CENTER ONLY)
Abs Immature Granulocytes: 0.04 10*3/uL (ref 0.00–0.07)
Basophils Absolute: 0.1 10*3/uL (ref 0.0–0.1)
Basophils Relative: 1 %
Eosinophils Absolute: 1.3 10*3/uL — ABNORMAL HIGH (ref 0.0–0.5)
Eosinophils Relative: 16 %
HCT: 42.6 % (ref 36.0–46.0)
Hemoglobin: 13.3 g/dL (ref 12.0–15.0)
Immature Granulocytes: 1 %
Lymphocytes Relative: 21 %
Lymphs Abs: 1.6 10*3/uL (ref 0.7–4.0)
MCH: 28.6 pg (ref 26.0–34.0)
MCHC: 31.2 g/dL (ref 30.0–36.0)
MCV: 91.6 fL (ref 80.0–100.0)
Monocytes Absolute: 1 10*3/uL (ref 0.1–1.0)
Monocytes Relative: 13 %
Neutro Abs: 3.8 10*3/uL (ref 1.7–7.7)
Neutrophils Relative %: 48 %
Platelet Count: 266 10*3/uL (ref 150–400)
RBC: 4.65 MIL/uL (ref 3.87–5.11)
RDW: 14.5 % (ref 11.5–15.5)
WBC Count: 7.8 10*3/uL (ref 4.0–10.5)
nRBC: 0 % (ref 0.0–0.2)

## 2021-11-09 LAB — CMP (CANCER CENTER ONLY)
ALT: 23 U/L (ref 0–44)
AST: 27 U/L (ref 15–41)
Albumin: 4 g/dL (ref 3.5–5.0)
Alkaline Phosphatase: 68 U/L (ref 38–126)
Anion gap: 8 (ref 5–15)
BUN: 18 mg/dL (ref 8–23)
CO2: 30 mmol/L (ref 22–32)
Calcium: 10.4 mg/dL — ABNORMAL HIGH (ref 8.9–10.3)
Chloride: 106 mmol/L (ref 98–111)
Creatinine: 1.01 mg/dL — ABNORMAL HIGH (ref 0.44–1.00)
GFR, Estimated: 57 mL/min — ABNORMAL LOW (ref >60)
Glucose, Bld: 98 mg/dL (ref 70–99)
Potassium: 4.5 mmol/L (ref 3.5–5.1)
Sodium: 144 mmol/L (ref 135–145)
Total Bilirubin: 0.5 mg/dL (ref 0.3–1.2)
Total Protein: 7.3 g/dL (ref 6.5–8.1)

## 2021-11-09 LAB — VITAMIN D 25 HYDROXY (VIT D DEFICIENCY, FRACTURES): Vit D, 25-Hydroxy: 52.7 ng/mL (ref 30–100)

## 2021-11-09 MED ORDER — DENOSUMAB 60 MG/ML ~~LOC~~ SOSY
60.0000 mg | PREFILLED_SYRINGE | Freq: Once | SUBCUTANEOUS | Status: AC
  Administered 2021-11-09: 60 mg via SUBCUTANEOUS
  Filled 2021-11-09: qty 1

## 2021-11-09 NOTE — Progress Notes (Signed)
Hematology and Oncology Follow Up Visit  Kimberly [name] [id number] 1941/01/05 81 y.o. 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  Oncotype score is 7  Current Therapy:   Femara 2.5 mg by mouth daily -- completed 5 yrs in 05/2020 Prolia 60 mg subcutaneous every 6 months - next dose in March 2024  S/p XRT - completed in May 2017 - 50.4 [name]     Interim History:  Kimberly [name] is back for follow-up.  We saw her 6 months ago.  She been doing pretty well.  About a week ago, she has swelling in her right wrist.  She was given some steroids and some antibiotics.  This seems to be a lot better.  She has had no issues with bowels or bladder.  She has had no rashes.  There is been no bleeding.  She has had no problems with appetite.  Her weights been holding pretty steady.  She continues on the Femara.  She gets her Prolia today.    Of note, her CA 27.29 is was been elevated.  When I last saw her it was up to 33.  We have never found any evidence of recurrent disease.  She has had no headache.  There is been no visual changes.  Overall, I would say performance status is probably ECOG 1.     Medications:  Current Outpatient Medications:    acetaminophen (TYLENOL) 650 MG CR tablet, Take 1,300 mg by mouth. Take 2 pills in the PM, Disp: , Rfl:    Ascorbic Acid (VITAMIN C) 1000 MG tablet, Take 1,000 mg by mouth daily., Disp: , Rfl:    aspirin EC 81 MG tablet, Take 81 mg by mouth daily., Disp: , Rfl:    calcium carbonate (OS-CAL) 600 MG TABS tablet, Take 600 mg by mouth daily. , Disp: , Rfl:    Cholecalciferol (VITAMIN D) 2000 units CAPS, Take by mouth., Disp: , Rfl:    Coenzyme Q10-Vitamin E (QUNOL ULTRA COQ10 PO), Take 2 capsules by mouth daily., Disp: , Rfl:    Glucosamine-Chondroit-Vit C-Mn (GLUCOSAMINE CHONDR 500 COMPLEX PO), Take by mouth., Disp: , Rfl:    loratadine (CLARITIN) 10 MG tablet, Take 10 mg by mouth daily., Disp: ,  Rfl:    Multiple Vitamin (MULTI-VITAMINS) TABS, Take by mouth., Disp: , Rfl:    Pyridoxine HCl (VITAMIN B-6 PO), Take by mouth daily. , Disp: , Rfl:    zinc gluconate 50 MG tablet, Take 50 mg by mouth daily., Disp: , Rfl:    letrozole (FEMARA) 2.5 MG tablet, Take 1 tablet (2.5 mg total) by mouth daily., Disp: 90 tablet, Rfl: 3   meclizine (ANTIVERT) 25 MG tablet, Take by mouth 2 (two) times daily as needed. (Patient not taking: Reported on 11/09/2021), Disp: , Rfl:    naproxen sodium (ANAPROX) 220 MG tablet, Take 220 mg by mouth daily., Disp: , Rfl:    predniSONE (DELTASONE) 10 MG tablet, Take 5 tabs p.o. on day 1 and decrease by 1 tablet daily until complete, Disp: 15 tablet, Rfl: 0  Allergies:  Allergies  Allergen Reactions   Codeine Nausea And Vomiting and Other (See Comments)    Head feels funny.   Codeine Sulfate Other (See Comments)    Head feels funny.    Past Medical History, Surgical history, Social history, and Family History were reviewed and updated.  Review of Systems: Review of Systems  Constitutional: Negative.   HENT: Negative.  Eyes: Negative.   Respiratory: Negative.    Cardiovascular: Negative.   Gastrointestinal: Negative.   Genitourinary: Negative.   Musculoskeletal:  Positive for joint pain.  Skin: Negative.   Neurological: Negative.   Endo/Heme/Allergies: Negative.   Psychiatric/Behavioral: Negative.       Physical Exam:  vitals were not taken for this visit.   Wt Readings from Last 3 Encounters:  11/01/21 208 lb 12.4 oz (94.7 kg)  11/10/20 208 lb 12 oz (94.7 kg)  05/12/20 211 lb 12 oz (96 kg)   Her vital signs show temperature of 98.1.  Pulse is 63.  Blood pressure 120/79.  Weight is 206 pounds.  Physical Exam Vitals reviewed.  Constitutional:      Comments: Breast exam shows right breast but no masses, edema or erythema.  There is no right axillary adenopathy.  Left chest wall shows well-healed mastectomy.  There is no chest wall nodules or  erythema.  There is no tenderness.  She has no left axillary adenopathy.  HENT:     Head: Normocephalic and atraumatic.  Eyes:     Pupils: Pupils are equal, round, and reactive to light.  Cardiovascular:     Rate and Rhythm: Normal rate and regular rhythm.     Heart sounds: Normal heart sounds.  Pulmonary:     Effort: Pulmonary effort is normal.     Breath sounds: Normal breath sounds.  Abdominal:     General: Bowel sounds are normal.     Palpations: Abdomen is soft.  Musculoskeletal:        General: No tenderness or deformity. Normal range of motion.     Cervical back: Normal range of motion.  Lymphadenopathy:     Cervical: No cervical adenopathy.  Skin:    General: Skin is warm and dry.     Findings: No erythema or rash.  Neurological:     Mental Status: She is alert and oriented to person, place, and time.  Psychiatric:        Behavior: Behavior normal.        Thought Content: Thought content normal.        Judgment: Judgment normal.    Lab Results  Component Value Date   WBC 7.8 11/09/2021   HGB 13.3 11/09/2021   HCT 42.6 11/09/2021   MCV 91.6 11/09/2021   PLT 266 11/09/2021     Chemistry      Component Value Date/Time   NA 146 (H) 05/11/2021 0757   NA 143 11/01/2016 1030   NA 141 02/03/2016 1118   K 4.9 05/11/2021 0757   K 4.8 (H) 11/01/2016 1030   K 4.0 02/03/2016 1118   CL 108 05/11/2021 0757   CL 107 11/01/2016 1030   CO2 30 05/11/2021 0757   CO2 30 11/01/2016 1030   CO2 26 02/03/2016 1118   BUN 25 (H) 05/11/2021 0757   BUN 25 (H) 11/01/2016 1030   BUN 21.9 02/03/2016 1118   CREATININE 0.99 05/11/2021 0757   CREATININE 0.9 11/01/2016 1030   CREATININE 0.9 02/03/2016 1118      Component Value Date/Time   CALCIUM 10.6 (H) 05/11/2021 0757   CALCIUM 10.0 11/01/2016 1030   CALCIUM 10.0 02/03/2016 1118   ALKPHOS 75 05/11/2021 0757   ALKPHOS 82 11/01/2016 1030   ALKPHOS 99 02/03/2016 1118   AST 24 05/11/2021 0757   AST 32 02/03/2016 1118   ALT 17  05/11/2021 0757   ALT 29 11/01/2016 1030   ALT 27 02/03/2016 1118  BILITOT 0.5 05/11/2021 0757   BILITOT 0.36 02/03/2016 1118  Oh   Impression and Plan: Kimberly [name] is a 81 year old postmenopausal female with stage IIb ductal carcinoma of the left breast. She had one positive lymph node. She had a very low Oncotype score of 7, so I did not think that there will be a role for chemotherapy.  So not sure as to why the CA 27.29 has been elevated.  Is always been elevated.  We have never found any evidence of recurrent disease.  Given the very low Oncotype score, I would think the risk of recurrence would easily be less than 10%.  Her labs all look fine.  She will get her Prolia today.  I will have her come back in another 6 months.    Volanda Napoleon, MD 9/19/20238:26 AM

## 2021-11-09 NOTE — Patient Instructions (Signed)
Denosumab Injection (Osteoporosis) What is this medication? DENOSUMAB (den oh SUE mab) prevents and treats osteoporosis. It works by making your bones stronger and less likely to break (fracture). It is a monoclonal antibody. This medicine may be used for other purposes; ask your health care provider or pharmacist if you have questions. COMMON BRAND NAME(S): Prolia What should I tell my care team before I take this medication? They need to know if you have any of these conditions: Dental or gum disease, or plan to have dental surgery or a tooth pulled Infection Kidney disease Low levels of calcium or vitamin D in your blood On dialysis Poor nutrition Skin conditions Thyroid disease, or have had thyroid or parathyroid surgery Trouble absorbing minerals in your stomach or intestine An unusual reaction to denosumab, other medications, foods, dyes, or preservatives Pregnant or trying to get pregnant Breast-feeding How should I use this medication? This medication is injected under the skin. It is given by your care team in a hospital or clinic setting. A special MedGuide will be given to you before each treatment. Be sure to read this information carefully each time. Talk to your care team about the use of this medication in children. Special care may be needed. Overdosage: If you think you have taken too much of this medicine contact a poison control center or emergency room at once. NOTE: This medicine is only for you. Do not share this medicine with others. What if I miss a dose? Keep appointments for follow-up doses. It is important not to miss your dose. Call your care team if you are unable to keep an appointment. What may interact with this medication? Do not take this medication with any of the following: Other medications that contain denosumab This medication may also interact with the following: Medications that lower your chance of fighting infection Steroid medications, such  as prednisone or cortisone This list may not describe all possible interactions. Give your health care provider a list of all the medicines, herbs, non-prescription drugs, or dietary supplements you use. Also tell them if you smoke, drink alcohol, or use illegal drugs. Some items may interact with your medicine. What should I watch for while using this medication? Your condition will be monitored carefully while you are receiving this medication. You may need blood work while taking this medication. This medication may increase your risk of getting an infection. Call your care team for advice if you get a fever, chills, sore throat, or other symptoms of a cold or flu. Do not treat yourself. Try to avoid being around people who are sick. Tell your dentist and dental surgeon that you are taking this medication. You should not have major dental surgery while on this medication. See your dentist to have a dental exam and fix any dental problems before starting this medication. Take good care of your teeth while on this medication. Make sure you see your dentist for regular follow-up appointments. You should make sure you get enough calcium and vitamin D while you are taking this medication. Discuss the foods you eat and the vitamins you take with your care team. Talk to your care team if you are pregnant or think you might be pregnant. This medication can cause serious birth defects if taken during pregnancy and for 5 months after the last dose. You will need a negative pregnancy test before starting this medication. Contraception is recommended while taking this medication and for 5 months after the last dose. Your care team can   help you find the option that works for you. Talk to your care team before breastfeeding. Changes to your treatment plan may be needed. What side effects may I notice from receiving this medication? Side effects that you should report to your care team as soon as possible: Allergic  reactions--skin rash, itching, hives, swelling of the face, lips, tongue, or throat Infection--fever, chills, cough, sore throat, wounds that don't heal, pain or trouble when passing urine, general feeling of discomfort or being unwell Low calcium level--muscle pain or cramps, confusion, tingling, or numbness in the hands or feet Osteonecrosis of the jaw--pain, swelling, or redness in the mouth, numbness of the jaw, poor healing after dental work, unusual discharge from the mouth, visible bones in the mouth Severe bone, joint, or muscle pain Skin infection--skin redness, swelling, warmth, or pain Side effects that usually do not require medical attention (report these to your care team if they continue or are bothersome): Back pain Headache Joint pain Muscle pain Pain in the hands, arms, legs, or feet Runny or stuffy nose Sore throat This list may not describe all possible side effects. Call your doctor for medical advice about side effects. You may report side effects to FDA at 1-800-FDA-1088. Where should I keep my medication? This medication is given in a hospital or clinic. It will not be stored at home. NOTE: This sheet is a summary. It may not cover all possible information. If you have questions about this medicine, talk to your doctor, pharmacist, or health care provider.  2023 Elsevier/Gold Standard (2021-06-21 00:00:00)  

## 2021-11-10 LAB — CANCER ANTIGEN 27.29: CA 27.29: 36.6 U/mL (ref 0.0–38.6)

## 2022-05-10 ENCOUNTER — Encounter: Payer: Self-pay | Admitting: Hematology & Oncology

## 2022-05-10 ENCOUNTER — Inpatient Hospital Stay (HOSPITAL_BASED_OUTPATIENT_CLINIC_OR_DEPARTMENT_OTHER): Payer: Medicare Other | Admitting: Hematology & Oncology

## 2022-05-10 ENCOUNTER — Inpatient Hospital Stay: Payer: Medicare Other | Attending: Hematology & Oncology

## 2022-05-10 ENCOUNTER — Inpatient Hospital Stay: Payer: Medicare Other

## 2022-05-10 VITALS — BP 120/75 | HR 71 | Temp 98.0°F | Resp 17 | Wt 205.4 lb

## 2022-05-10 DIAGNOSIS — C50112 Malignant neoplasm of central portion of left female breast: Secondary | ICD-10-CM

## 2022-05-10 DIAGNOSIS — Z9013 Acquired absence of bilateral breasts and nipples: Secondary | ICD-10-CM | POA: Diagnosis not present

## 2022-05-10 DIAGNOSIS — N200 Calculus of kidney: Secondary | ICD-10-CM | POA: Diagnosis not present

## 2022-05-10 DIAGNOSIS — M818 Other osteoporosis without current pathological fracture: Secondary | ICD-10-CM

## 2022-05-10 DIAGNOSIS — C50212 Malignant neoplasm of upper-inner quadrant of left female breast: Secondary | ICD-10-CM

## 2022-05-10 DIAGNOSIS — N951 Menopausal and female climacteric states: Secondary | ICD-10-CM | POA: Diagnosis not present

## 2022-05-10 DIAGNOSIS — M1712 Unilateral primary osteoarthritis, left knee: Secondary | ICD-10-CM

## 2022-05-10 DIAGNOSIS — C773 Secondary and unspecified malignant neoplasm of axilla and upper limb lymph nodes: Secondary | ICD-10-CM | POA: Insufficient documentation

## 2022-05-10 DIAGNOSIS — C50912 Malignant neoplasm of unspecified site of left female breast: Secondary | ICD-10-CM | POA: Insufficient documentation

## 2022-05-10 DIAGNOSIS — Z79811 Long term (current) use of aromatase inhibitors: Secondary | ICD-10-CM | POA: Diagnosis not present

## 2022-05-10 DIAGNOSIS — Z170 Estrogen receptor positive status [ER+]: Secondary | ICD-10-CM

## 2022-05-10 DIAGNOSIS — Z45819 Encounter for adjustment or removal of unspecified breast implant: Secondary | ICD-10-CM

## 2022-05-10 DIAGNOSIS — C50012 Malignant neoplasm of nipple and areola, left female breast: Secondary | ICD-10-CM

## 2022-05-10 LAB — CBC WITH DIFFERENTIAL (CANCER CENTER ONLY)
Abs Immature Granulocytes: 0.02 10*3/uL (ref 0.00–0.07)
Basophils Absolute: 0.1 10*3/uL (ref 0.0–0.1)
Basophils Relative: 1 %
Eosinophils Absolute: 0.3 10*3/uL (ref 0.0–0.5)
Eosinophils Relative: 4 %
HCT: 43.8 % (ref 36.0–46.0)
Hemoglobin: 13.9 g/dL (ref 12.0–15.0)
Immature Granulocytes: 0 %
Lymphocytes Relative: 16 %
Lymphs Abs: 1.2 10*3/uL (ref 0.7–4.0)
MCH: 29.1 pg (ref 26.0–34.0)
MCHC: 31.7 g/dL (ref 30.0–36.0)
MCV: 91.8 fL (ref 80.0–100.0)
Monocytes Absolute: 0.9 10*3/uL (ref 0.1–1.0)
Monocytes Relative: 13 %
Neutro Abs: 4.7 10*3/uL (ref 1.7–7.7)
Neutrophils Relative %: 66 %
Platelet Count: 196 10*3/uL (ref 150–400)
RBC: 4.77 MIL/uL (ref 3.87–5.11)
RDW: 14 % (ref 11.5–15.5)
WBC Count: 7.2 10*3/uL (ref 4.0–10.5)
nRBC: 0 % (ref 0.0–0.2)

## 2022-05-10 LAB — LACTATE DEHYDROGENASE: LDH: 168 U/L (ref 98–192)

## 2022-05-10 LAB — CMP (CANCER CENTER ONLY)
ALT: 16 U/L (ref 0–44)
AST: 24 U/L (ref 15–41)
Albumin: 4.4 g/dL (ref 3.5–5.0)
Alkaline Phosphatase: 59 U/L (ref 38–126)
Anion gap: 9 (ref 5–15)
BUN: 21 mg/dL (ref 8–23)
CO2: 31 mmol/L (ref 22–32)
Calcium: 10.5 mg/dL — ABNORMAL HIGH (ref 8.9–10.3)
Chloride: 106 mmol/L (ref 98–111)
Creatinine: 0.92 mg/dL (ref 0.44–1.00)
GFR, Estimated: >60 mL/min (ref >60)
Glucose, Bld: 95 mg/dL (ref 70–99)
Potassium: 4.6 mmol/L (ref 3.5–5.1)
Sodium: 146 mmol/L — ABNORMAL HIGH (ref 135–145)
Total Bilirubin: 0.7 mg/dL (ref 0.3–1.2)
Total Protein: 7.4 g/dL (ref 6.5–8.1)

## 2022-05-10 MED ORDER — DENOSUMAB 60 MG/ML ~~LOC~~ SOSY
60.0000 mg | PREFILLED_SYRINGE | Freq: Once | SUBCUTANEOUS | Status: DC
  Filled 2022-05-10: qty 1

## 2022-05-10 MED ORDER — DENOSUMAB 60 MG/ML ~~LOC~~ SOSY
60.0000 mg | PREFILLED_SYRINGE | Freq: Once | SUBCUTANEOUS | Status: AC
  Administered 2022-05-10: 60 mg via SUBCUTANEOUS

## 2022-05-10 NOTE — Progress Notes (Signed)
Hematology and Oncology Follow Up Visit  Kimberly [name] [id number] 08/24/1940 82 y.o. 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  Oncotype score is 7  Current Therapy:   Femara 2.5 mg by mouth daily -- completed 5 yrs in 05/2020 Prolia 60 mg subcutaneous every 6 months - next dose in March 2024  S/p XRT - completed in May 2017 - 50.4 [name]     Interim History:  Ms. Kimberly [name] is back for follow-up.  Unfortunately, she had COVID back in November.  She seems to have a very rare complication of COVID.  She is having ocular issues.  She apparently has had problems with her right eye.  She has blurred vision.  She has occasional double vision.  She has no other symptoms at all.  This seems to happen more so when she has to look long distance.  This seems to happen when she is in charge.  She is followed by ophthalmology and optometry.  She is on some eyedrops right now.  Hopefully, this will resolve..  She did take Paxlovid when she got COVID.  Her husband also got COVID but he seems to be doing all right.  Otherwise, she is doing okay.  She has had no problems with cough or shortness of breath.  She has had no nausea or vomiting.  She has had no change in bowel or bladder habits.  There is been no rashes.  She has had no leg swelling.  She has had no bleeding.  There is been no cough.  She has had no swollen lymph nodes.  I know that we do follow her CA 27.29.  The last time that was our, is 37.  Overall, I would have said that her performance status is probably ECOG 1.       Medications:  Current Outpatient Medications:    acetaminophen (TYLENOL) 650 MG CR tablet, Take 1,300 mg by mouth. Take 2 pills in the PM, Disp: , Rfl:    Ascorbic Acid (VITAMIN C) 1000 MG tablet, Take 1,000 mg by mouth daily., Disp: , Rfl:    aspirin EC 81 MG tablet, Take 81 mg by mouth daily., Disp: , Rfl:    calcium carbonate (OS-CAL) 600 MG TABS tablet,  Take 600 mg by mouth daily. , Disp: , Rfl:    Cholecalciferol (VITAMIN D) 2000 units CAPS, Take by mouth., Disp: , Rfl:    Coenzyme Q10-Vitamin E (QUNOL ULTRA COQ10 PO), Take 2 capsules by mouth daily., Disp: , Rfl:    Glucosamine-Chondroit-Vit C-Mn (GLUCOSAMINE CHONDR 500 COMPLEX PO), Take by mouth., Disp: , Rfl:    letrozole (FEMARA) 2.5 MG tablet, Take 1 tablet (2.5 mg total) by mouth daily., Disp: 90 tablet, Rfl: 3   loratadine (CLARITIN) 10 MG tablet, Take 10 mg by mouth daily., Disp: , Rfl:    meclizine (ANTIVERT) 25 MG tablet, Take by mouth 2 (two) times daily as needed., Disp: , Rfl:    Multiple Vitamin (MULTI-VITAMINS) TABS, Take by mouth., Disp: , Rfl:    naproxen sodium (ANAPROX) 220 MG tablet, Take 220 mg by mouth daily., Disp: , Rfl:    predniSONE (DELTASONE) 10 MG tablet, Take 5 tabs p.o. on day 1 and decrease by 1 tablet daily until complete, Disp: 15 tablet, Rfl: 0   Pyridoxine HCl (VITAMIN B-6 PO), Take by mouth daily. , Disp: , Rfl:    zinc gluconate 50 MG tablet, Take 50 mg by  mouth daily., Disp: , Rfl:   Allergies:  Allergies  Allergen Reactions   Codeine Nausea And Vomiting and Other (See Comments)    Head feels funny.   Codeine Sulfate Other (See Comments)    Head feels funny.    Past Medical History, Surgical history, Social history, and Family History were reviewed and updated.  Review of Systems: Review of Systems  Constitutional: Negative.   HENT: Negative.    Eyes: Negative.   Respiratory: Negative.    Cardiovascular: Negative.   Gastrointestinal: Negative.   Genitourinary: Negative.   Musculoskeletal:  Positive for joint pain.  Skin: Negative.   Neurological: Negative.   Endo/Heme/Allergies: Negative.   Psychiatric/Behavioral: Negative.       Physical Exam:  weight is 205 lb 6.4 oz (93.2 kg). Her oral temperature is 98 F (36.7 C). Her blood pressure is 120/75 and her pulse is 71. Her respiration is 17 and oxygen saturation is 100%.   Wt  Readings from Last 3 Encounters:  05/10/22 205 lb 6.4 oz (93.2 kg)  11/09/21 206 lb 12 oz (93.8 kg)  11/01/21 208 lb 12.4 oz (94.7 kg)   Her vital signs show temperature of 98.1.  Pulse is 63.  Blood pressure 120/79.  Weight is 206 pounds.  Physical Exam Vitals reviewed.  Constitutional:      Comments: Breast exam shows right breast but no masses, edema or erythema.  There is no right axillary adenopathy.  Left chest wall shows well-healed mastectomy.  There is no chest wall nodules or erythema.  There is no tenderness.  She has no left axillary adenopathy.  HENT:     Head: Normocephalic and atraumatic.  Eyes:     Pupils: Pupils are equal, round, and reactive to light.  Cardiovascular:     Rate and Rhythm: Normal rate and regular rhythm.     Heart sounds: Normal heart sounds.  Pulmonary:     Effort: Pulmonary effort is normal.     Breath sounds: Normal breath sounds.  Abdominal:     General: Bowel sounds are normal.     Palpations: Abdomen is soft.  Musculoskeletal:        General: No tenderness or deformity. Normal range of motion.     Cervical back: Normal range of motion.  Lymphadenopathy:     Cervical: No cervical adenopathy.  Skin:    General: Skin is warm and dry.     Findings: No erythema or rash.  Neurological:     Mental Status: She is alert and oriented to person, place, and time.  Psychiatric:        Behavior: Behavior normal.        Thought Content: Thought content normal.        Judgment: Judgment normal.   Lab Results  Component Value Date   WBC 7.2 05/10/2022   HGB 13.9 05/10/2022   HCT 43.8 05/10/2022   MCV 91.8 05/10/2022   PLT 196 05/10/2022     Chemistry      Component Value Date/Time   NA 146 (H) 05/10/2022 0751   NA 143 11/01/2016 1030   NA 141 02/03/2016 1118   K 4.6 05/10/2022 0751   K 4.8 (H) 11/01/2016 1030   K 4.0 02/03/2016 1118   CL 106 05/10/2022 0751   CL 107 11/01/2016 1030   CO2 31 05/10/2022 0751   CO2 30 11/01/2016 1030    CO2 26 02/03/2016 1118   BUN 21 05/10/2022 0751   BUN 25 (H) 11/01/2016  1030   BUN 21.9 02/03/2016 1118   CREATININE 0.92 05/10/2022 0751   CREATININE 0.9 11/01/2016 1030   CREATININE 0.9 02/03/2016 1118      Component Value Date/Time   CALCIUM 10.5 (H) 05/10/2022 0751   CALCIUM 10.0 11/01/2016 1030   CALCIUM 10.0 02/03/2016 1118   ALKPHOS 59 05/10/2022 0751   ALKPHOS 82 11/01/2016 1030   ALKPHOS 99 02/03/2016 1118   AST 24 05/10/2022 0751   AST 32 02/03/2016 1118   ALT 16 05/10/2022 0751   ALT 29 11/01/2016 1030   ALT 27 02/03/2016 1118   BILITOT 0.7 05/10/2022 0751   BILITOT 0.36 02/03/2016 1118  Oh   Impression and Plan: Ms. Dessources is a 82 year old postmenopausal female with stage IIb ductal carcinoma of the left breast. She had one positive lymph node. She had a very low Oncotype score of 7, so I did not think that there will be a role for chemotherapy.  I feel bad that she had this issue with the [facility].  I guess I have never heard of COVID affecting the eye.  I suppose that this probably could happen.  Hopefully this is just something that will be temporary.  Otherwise, she looks great.  We will see what her CA 27.29 looks like.  She will get her Prolia today.  I will plan to have her come back in another 6 months.    Volanda Napoleon, MD 3/19/20248:26 AM

## 2022-05-10 NOTE — Patient Instructions (Signed)
Denosumab Injection (Osteoporosis) What is this medication? DENOSUMAB (den oh SUE mab) prevents and treats osteoporosis. It works by making your bones stronger and less likely to break (fracture). It is a monoclonal antibody. This medicine may be used for other purposes; ask your health care provider or pharmacist if you have questions. COMMON BRAND NAME(S): Prolia What should I tell my care team before I take this medication? They need to know if you have any of these conditions: Dental or gum disease, or plan to have dental surgery or a tooth pulled Infection Kidney disease Low levels of calcium or vitamin D in your blood On dialysis Poor nutrition Skin conditions Thyroid disease, or have had thyroid or parathyroid surgery Trouble absorbing minerals in your stomach or intestine An unusual or allergic reaction to denosumab, other medications, foods, dyes, or preservatives Pregnant or trying to get pregnant Breastfeeding How should I use this medication? This medication is injected under the skin. It is given by your care team in a hospital or clinic setting. A special MedGuide will be given to you before each treatment. Be sure to read this information carefully each time. Talk to your care team about the use of this medication in children. Special care may be needed. Overdosage: If you think you have taken too much of this medicine contact a poison control center or emergency room at once. NOTE: This medicine is only for you. Do not share this medicine with others. What if I miss a dose? Keep appointments for follow-up doses. It is important not to miss your dose. Call your care team if you are unable to keep an appointment. What may interact with this medication? Do not take this medication with any of the following: Other medications that contain denosumab This medication may also interact with the following: Medications that lower your chance of fighting infection Steroid  medications, such as prednisone or cortisone This list may not describe all possible interactions. Give your health care provider a list of all the medicines, herbs, non-prescription drugs, or dietary supplements you use. Also tell them if you smoke, drink alcohol, or use illegal drugs. Some items may interact with your medicine. What should I watch for while using this medication? Your condition will be monitored carefully while you are receiving this medication. You may need blood work while taking this medication. This medication may increase your risk of getting an infection. Call your care team for advice if you get a fever, chills, sore throat, or other symptoms of a cold or flu. Do not treat yourself. Try to avoid being around people who are sick. Tell your dentist and dental surgeon that you are taking this medication. You should not have major dental surgery while on this medication. See your dentist to have a dental exam and fix any dental problems before starting this medication. Take good care of your teeth while on this medication. Make sure you see your dentist for regular follow-up appointments. You should make sure you get enough calcium and vitamin D while you are taking this medication. Discuss the foods you eat and the vitamins you take with your care team. Talk to your care team if you are pregnant or think you might be pregnant. This medication can cause serious birth defects if taken during pregnancy and for 5 months after the last dose. You will need a negative pregnancy test before starting this medication. Contraception is recommended while taking this medication and for 5 months after the last dose. Your care   team can help you find the option that works for you. Talk to your care team before breastfeeding. Changes to your treatment plan may be needed. What side effects may I notice from receiving this medication? Side effects that you should report to your care team as soon as  possible: Allergic reactions--skin rash, itching, hives, swelling of the face, lips, tongue, or throat Infection--fever, chills, cough, sore throat, wounds that don't heal, pain or trouble when passing urine, general feeling of discomfort or being unwell Low calcium level--muscle pain or cramps, confusion, tingling, or numbness in the hands or feet Osteonecrosis of the jaw--pain, swelling, or redness in the mouth, numbness of the jaw, poor healing after dental work, unusual discharge from the mouth, visible bones in the mouth Severe bone, joint, or muscle pain Skin infection--skin redness, swelling, warmth, or pain Side effects that usually do not require medical attention (report these to your care team if they continue or are bothersome): Back pain Headache Joint pain Muscle pain Pain in the hands, arms, legs, or feet Runny or stuffy nose Sore throat This list may not describe all possible side effects. Call your doctor for medical advice about side effects. You may report side effects to FDA at 1-800-FDA-1088. Where should I keep my medication? This medication is given in a hospital or clinic. It will not be stored at home. NOTE: This sheet is a summary. It may not cover all possible information. If you have questions about this medicine, talk to your doctor, pharmacist, or health care provider.  2023 Elsevier/Gold Standard (2021-06-21 00:00:00)  

## 2022-05-11 ENCOUNTER — Encounter: Payer: Self-pay | Admitting: *Deleted

## 2022-05-11 LAB — CANCER ANTIGEN 27.29: CA 27.29: 28.8 U/mL (ref 0.0–38.6)

## 2022-09-28 ENCOUNTER — Other Ambulatory Visit: Payer: Self-pay | Admitting: Internal Medicine

## 2022-09-28 DIAGNOSIS — Z1231 Encounter for screening mammogram for malignant neoplasm of breast: Secondary | ICD-10-CM

## 2022-11-07 ENCOUNTER — Ambulatory Visit
Admission: RE | Admit: 2022-11-07 | Discharge: 2022-11-07 | Disposition: A | Discharge status: Home / Self Care | Payer: Medicare Other | Source: Ambulatory Visit | Attending: Internal Medicine | Admitting: Internal Medicine

## 2022-11-07 DIAGNOSIS — Z1231 Encounter for screening mammogram for malignant neoplasm of breast: Secondary | ICD-10-CM

## 2022-11-15 ENCOUNTER — Inpatient Hospital Stay: Payer: Medicare Other

## 2022-11-15 ENCOUNTER — Inpatient Hospital Stay (HOSPITAL_BASED_OUTPATIENT_CLINIC_OR_DEPARTMENT_OTHER): Payer: Medicare Other | Admitting: Hematology & Oncology

## 2022-11-15 ENCOUNTER — Inpatient Hospital Stay: Payer: Medicare Other | Attending: Hematology & Oncology

## 2022-11-15 ENCOUNTER — Encounter: Payer: Self-pay | Admitting: Hematology & Oncology

## 2022-11-15 VITALS — BP 102/70 | HR 68 | Temp 97.9°F | Resp 18 | Wt 202.1 lb

## 2022-11-15 DIAGNOSIS — Z79811 Long term (current) use of aromatase inhibitors: Secondary | ICD-10-CM | POA: Diagnosis not present

## 2022-11-15 DIAGNOSIS — C50912 Malignant neoplasm of unspecified site of left female breast: Secondary | ICD-10-CM | POA: Insufficient documentation

## 2022-11-15 DIAGNOSIS — Z170 Estrogen receptor positive status [ER+]: Secondary | ICD-10-CM | POA: Diagnosis not present

## 2022-11-15 DIAGNOSIS — C50112 Malignant neoplasm of central portion of left female breast: Secondary | ICD-10-CM

## 2022-11-15 DIAGNOSIS — C50012 Malignant neoplasm of nipple and areola, left female breast: Secondary | ICD-10-CM

## 2022-11-15 DIAGNOSIS — C773 Secondary and unspecified malignant neoplasm of axilla and upper limb lymph nodes: Secondary | ICD-10-CM | POA: Insufficient documentation

## 2022-11-15 DIAGNOSIS — Z9013 Acquired absence of bilateral breasts and nipples: Secondary | ICD-10-CM

## 2022-11-15 LAB — CBC WITH DIFFERENTIAL (CANCER CENTER ONLY)
Abs Immature Granulocytes: 0.02 10*3/uL (ref 0.00–0.07)
Basophils Absolute: 0.1 10*3/uL (ref 0.0–0.1)
Basophils Relative: 2 %
Eosinophils Absolute: 0.5 10*3/uL (ref 0.0–0.5)
Eosinophils Relative: 8 %
HCT: 45.9 % (ref 36.0–46.0)
Hemoglobin: 14.5 g/dL (ref 12.0–15.0)
Immature Granulocytes: 0 %
Lymphocytes Relative: 23 %
Lymphs Abs: 1.3 10*3/uL (ref 0.7–4.0)
MCH: 29 pg (ref 26.0–34.0)
MCHC: 31.6 g/dL (ref 30.0–36.0)
MCV: 91.8 fL (ref 80.0–100.0)
Monocytes Absolute: 0.6 10*3/uL (ref 0.1–1.0)
Monocytes Relative: 10 %
Neutro Abs: 3.4 10*3/uL (ref 1.7–7.7)
Neutrophils Relative %: 57 %
Platelet Count: 201 10*3/uL (ref 150–400)
RBC: 5 MIL/uL (ref 3.87–5.11)
RDW: 14.4 % (ref 11.5–15.5)
WBC Count: 5.8 10*3/uL (ref 4.0–10.5)
nRBC: 0 % (ref 0.0–0.2)

## 2022-11-15 LAB — CMP (CANCER CENTER ONLY)
ALT: 21 U/L (ref 0–44)
AST: 28 U/L (ref 15–41)
Albumin: 4.3 g/dL (ref 3.5–5.0)
Alkaline Phosphatase: 61 U/L (ref 38–126)
Anion gap: 10 (ref 5–15)
BUN: 27 mg/dL — ABNORMAL HIGH (ref 8–23)
CO2: 30 mmol/L (ref 22–32)
Calcium: 10.1 mg/dL (ref 8.9–10.3)
Chloride: 105 mmol/L (ref 98–111)
Creatinine: 1.02 mg/dL — ABNORMAL HIGH (ref 0.44–1.00)
GFR, Estimated: 56 mL/min — ABNORMAL LOW (ref >60)
Glucose, Bld: 84 mg/dL (ref 70–99)
Potassium: 4.1 mmol/L (ref 3.5–5.1)
Sodium: 145 mmol/L (ref 135–145)
Total Bilirubin: 0.5 mg/dL (ref 0.3–1.2)
Total Protein: 7 g/dL (ref 6.5–8.1)

## 2022-11-15 LAB — LACTATE DEHYDROGENASE: LDH: 167 U/L (ref 98–192)

## 2022-11-15 MED ORDER — DENOSUMAB 60 MG/ML ~~LOC~~ SOSY
60.0000 mg | PREFILLED_SYRINGE | Freq: Once | SUBCUTANEOUS | Status: AC
  Administered 2022-11-15: 60 mg via SUBCUTANEOUS
  Filled 2022-11-15: qty 1

## 2022-11-15 NOTE — Patient Instructions (Signed)
Denosumab Injection (Osteoporosis) What is this medication? DENOSUMAB (den oh SUE mab) prevents and treats osteoporosis. It works by making your bones stronger and less likely to break (fracture). It is a monoclonal antibody. This medicine may be used for other purposes; ask your health care provider or pharmacist if you have questions. COMMON BRAND NAME(S): Prolia What should I tell my care team before I take this medication? They need to know if you have any of these conditions: Dental or gum disease Had thyroid or parathyroid (glands located in neck) surgery Having dental surgery or a tooth pulled Kidney disease Low levels of calcium in the blood On dialysis Poor nutrition Thyroid disease Trouble absorbing nutrients from your food An unusual or allergic reaction to denosumab, other medications, foods, dyes, or preservatives Pregnant or trying to get pregnant Breastfeeding How should I use this medication? This medication is injected under the skin. It is given by your care team in a hospital or clinic setting. A special MedGuide will be given to you before each treatment. Be sure to read this information carefully each time. Talk to your care team about the use of this medication in children. Special care may be needed. Overdosage: If you think you have taken too much of this medicine contact a poison control center or emergency room at once. NOTE: This medicine is only for you. Do not share this medicine with others. What if I miss a dose? Keep appointments for follow-up doses. It is important not to miss your dose. Call your care team if you are unable to keep an appointment. What may interact with this medication? Do not take this medication with any of the following: Other medications that contain denosumab This medication may also interact with the following: Medications that lower your chance of fighting infection Steroid medications, such as prednisone or cortisone This  list may not describe all possible interactions. Give your health care provider a list of all the medicines, herbs, non-prescription drugs, or dietary supplements you use. Also tell them if you smoke, drink alcohol, or use illegal drugs. Some items may interact with your medicine. What should I watch for while using this medication? Your condition will be monitored carefully while you are receiving this medication. You may need blood work done while taking this medication. This medication may increase your risk of getting an infection. Call your care team for advice if you get a fever, chills, sore throat, or other symptoms of a cold or flu. Do not treat yourself. Try to avoid being around people who are sick. Tell your dentist and dental surgeon that you are taking this medication. You should not have major dental surgery while on this medication. See your dentist to have a dental exam and fix any dental problems before starting this medication. Take good care of your teeth while on this medication. Make sure you see your dentist for regular follow-up appointments. This medication may cause low levels of calcium in your body. The risk of severe side effects is increased in people with kidney disease. Your care team may prescribe calcium and vitamin D to help prevent low calcium levels while you take this medication. It is important to take calcium and vitamin D as directed by your care team. Talk to your care team if you may be pregnant. Serious birth defects may occur if you take this medication during pregnancy and for 5 months after the last dose. You will need a negative pregnancy test before starting this medication. Contraception   is recommended while taking this medication and for 5 months after the last dose. Your care team can help you find the option that works for you. Talk to your care team before breastfeeding. Changes to your treatment plan may be needed. What side effects may I notice from  receiving this medication? Side effects that you should report to your care team as soon as possible: Allergic reactions--skin rash, itching, hives, swelling of the face, lips, tongue, or throat Infection--fever, chills, cough, sore throat, wounds that don't heal, pain or trouble when passing urine, general feeling of discomfort or being unwell Low calcium level--muscle pain or cramps, confusion, tingling, or numbness in the hands or feet Osteonecrosis of the jaw--pain, swelling, or redness in the mouth, numbness of the jaw, poor healing after dental work, unusual discharge from the mouth, visible bones in the mouth Severe bone, joint, or muscle pain Skin infection--skin redness, swelling, warmth, or pain Side effects that usually do not require medical attention (report these to your care team if they continue or are bothersome): Back pain Headache Joint pain Muscle pain Pain in the hands, arms, legs, or feet Runny or stuffy nose Sore throat This list may not describe all possible side effects. Call your doctor for medical advice about side effects. You may report side effects to FDA at 1-800-FDA-1088. Where should I keep my medication? This medication is given in a hospital or clinic. It will not be stored at home. NOTE: This sheet is a summary. It may not cover all possible information. If you have questions about this medicine, talk to your doctor, pharmacist, or health care provider.  2024 Elsevier/Gold Standard (2022-03-15 00:00:00)  

## 2022-11-15 NOTE — Progress Notes (Signed)
Hematology and Oncology Follow Up Visit  Kimberly [name] [id number] 1940/02/27 82 y.o. 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  Oncotype score is 7  Current Therapy:   Femara 2.5 mg by mouth daily -- completed 5 yrs in 05/2020 Prolia 60 mg subcutaneous every 6 months - next dose in March 2025  S/p XRT - completed in May 2017 - 50.4 Wallace Cullens     Interim History:  Kimberly [name] is back for follow-up.  She is doing quite well.  She will has no complaints since we last saw her.  She has had no problems with her vision.  I know that when she had COVID she did have some visual issues.  She has had no problems with bowels or bladder.  She has had no problems with cough or shortness of breath.  She has had no rashes.  There is been no leg swelling.  She has had no headache.  Overall, I would say that her performance status is probably ECOG 0.        Medications:  Current Outpatient Medications:    acetaminophen (TYLENOL) 650 MG CR tablet, Take 1,300 mg by mouth. Take 2 pills in the PM, Disp: , Rfl:    Ascorbic Acid (VITAMIN C) 1000 MG tablet, Take 1,000 mg by mouth daily., Disp: , Rfl:    aspirin EC 81 MG tablet, Take 81 mg by mouth daily., Disp: , Rfl:    calcium carbonate (OS-CAL) 600 MG TABS tablet, Take 600 mg by mouth daily. , Disp: , Rfl:    Cholecalciferol (VITAMIN D) 2000 units CAPS, Take by mouth., Disp: , Rfl:    Coenzyme Q10-Vitamin E (QUNOL ULTRA COQ10 PO), Take 2 capsules by mouth daily., Disp: , Rfl:    Glucosamine-Chondroit-Vit C-Mn (GLUCOSAMINE CHONDR 500 COMPLEX PO), Take by mouth., Disp: , Rfl:    loratadine (CLARITIN) 10 MG tablet, Take 10 mg by mouth daily., Disp: , Rfl:    meclizine (ANTIVERT) 25 MG tablet, Take by mouth 2 (two) times daily as needed., Disp: , Rfl:    Multiple Vitamin (MULTI-VITAMINS) TABS, Take by mouth., Disp: , Rfl:    naproxen sodium (ANAPROX) 220 MG tablet, Take 220 mg by mouth daily.,  Disp: , Rfl:    Pyridoxine HCl (VITAMIN B-6 PO), Take by mouth daily. , Disp: , Rfl:    zinc gluconate 50 MG tablet, Take 50 mg by mouth daily., Disp: , Rfl:    letrozole (FEMARA) 2.5 MG tablet, Take 1 tablet (2.5 mg total) by mouth daily. (Patient not taking: Reported on 11/15/2022), Disp: 90 tablet, Rfl: 3   predniSONE (DELTASONE) 10 MG tablet, Take 5 tabs p.o. on day 1 and decrease by 1 tablet daily until complete (Patient not taking: Reported on 11/15/2022), Disp: 15 tablet, Rfl: 0  Allergies:  Allergies  Allergen Reactions   Codeine Nausea And Vomiting and Other (See Comments)    Head feels funny.   Codeine Sulfate Other (See Comments)    Head feels funny.    Past Medical History, Surgical history, Social history, and Family History were reviewed and updated.  Review of Systems: Review of Systems  Constitutional: Negative.   HENT: Negative.    Eyes: Negative.   Respiratory: Negative.    Cardiovascular: Negative.   Gastrointestinal: Negative.   Genitourinary: Negative.   Musculoskeletal:  Positive for joint pain.  Skin: Negative.   Neurological: Negative.   Endo/Heme/Allergies: Negative.   Psychiatric/Behavioral: Negative.  Physical Exam:  weight is 202 lb 1.3 oz (91.7 kg). Her oral temperature is 97.9 F (36.6 C). Her blood pressure is 102/70 and her pulse is 68. Her respiration is 18 and oxygen saturation is 100%.   Wt Readings from Last 3 Encounters:  11/15/22 202 lb 1.3 oz (91.7 kg)  05/10/22 205 lb 6.4 oz (93.2 kg)  11/09/21 206 lb 12 oz (93.8 kg)   Her vital signs show temperature of 98.1.  Pulse is 63.  Blood pressure 120/79.  Weight is 206 pounds.  Physical Exam Vitals reviewed.  Constitutional:      Comments: Breast exam shows right breast but no masses, edema or erythema.  There is no right axillary adenopathy.  Left chest wall shows well-healed mastectomy.  There is no chest wall nodules or erythema.  There is no tenderness.  She has no left axillary  adenopathy.  HENT:     Head: Normocephalic and atraumatic.  Eyes:     Pupils: Pupils are equal, round, and reactive to light.  Cardiovascular:     Rate and Rhythm: Normal rate and regular rhythm.     Heart sounds: Normal heart sounds.  Pulmonary:     Effort: Pulmonary effort is normal.     Breath sounds: Normal breath sounds.  Abdominal:     General: Bowel sounds are normal.     Palpations: Abdomen is soft.  Musculoskeletal:        General: No tenderness or deformity. Normal range of motion.     Cervical back: Normal range of motion.  Lymphadenopathy:     Cervical: No cervical adenopathy.  Skin:    General: Skin is warm and dry.     Findings: No erythema or rash.  Neurological:     Mental Status: She is alert and oriented to person, place, and time.  Psychiatric:        Behavior: Behavior normal.        Thought Content: Thought content normal.        Judgment: Judgment normal.    Lab Results  Component Value Date   WBC 5.8 11/15/2022   HGB 14.5 11/15/2022   HCT 45.9 11/15/2022   MCV 91.8 11/15/2022   PLT 201 11/15/2022     Chemistry      Component Value Date/Time   NA 145 11/15/2022 0832   NA 143 11/01/2016 1030   NA 141 02/03/2016 1118   K 4.1 11/15/2022 0832   K 4.8 (H) 11/01/2016 1030   K 4.0 02/03/2016 1118   CL 105 11/15/2022 0832   CL 107 11/01/2016 1030   CO2 30 11/15/2022 0832   CO2 30 11/01/2016 1030   CO2 26 02/03/2016 1118   BUN 27 (H) 11/15/2022 0832   BUN 25 (H) 11/01/2016 1030   BUN 21.9 02/03/2016 1118   CREATININE 1.02 (H) 11/15/2022 0832   CREATININE 0.9 11/01/2016 1030   CREATININE 0.9 02/03/2016 1118      Component Value Date/Time   CALCIUM 10.1 11/15/2022 0832   CALCIUM 10.0 11/01/2016 1030   CALCIUM 10.0 02/03/2016 1118   ALKPHOS 61 11/15/2022 0832   ALKPHOS 82 11/01/2016 1030   ALKPHOS 99 02/03/2016 1118   AST 28 11/15/2022 0832   AST 32 02/03/2016 1118   ALT 21 11/15/2022 0832   ALT 29 11/01/2016 1030   ALT 27 02/03/2016  1118   BILITOT 0.5 11/15/2022 0832   BILITOT 0.36 02/03/2016 1118  Oh   Impression and Plan: Kimberly [name] is a  82 year old postmenopausal female with stage IIb ductal carcinoma of the left breast. She had one positive lymph node. She had a very low Oncotype score of 7, so I did not think that there will be a role for chemotherapy.  I am happy that she is doing well.  Her 82th birthday is coming up in December.  I am sure that she will have a wonderful birthday.  I am sure that there will be a big celebration.  Thankfully, her CA 27.29 has slowly been coming down.  We never found that she has had recurrent disease.  When I saw her, it was 29.  We will still plan to follow her up in 6 months.   [name], MD 9/24/20249:27 AM

## 2022-11-16 ENCOUNTER — Telehealth: Payer: Self-pay | Admitting: *Deleted

## 2022-11-16 LAB — CANCER ANTIGEN 27.29: CA 27.29: 38.7 U/mL — ABNORMAL HIGH (ref 0.0–38.6)

## 2022-11-16 NOTE — Telephone Encounter (Signed)
-----   Message from [name] sent at 11/16/2022  9:18 AM EDT ----- Please call and let her know that the tumor level is up a little bit.  However, I just think this is how it would be for her and she will not have any cancer.  Thanks.  Cindee Lame

## 2023-05-16 ENCOUNTER — Inpatient Hospital Stay: Payer: Medicare Other

## 2023-05-16 ENCOUNTER — Encounter: Payer: Self-pay | Admitting: Hematology & Oncology

## 2023-05-16 ENCOUNTER — Inpatient Hospital Stay: Payer: Medicare Other | Attending: Hematology & Oncology

## 2023-05-16 ENCOUNTER — Inpatient Hospital Stay (HOSPITAL_BASED_OUTPATIENT_CLINIC_OR_DEPARTMENT_OTHER): Payer: Medicare Other | Admitting: Hematology & Oncology

## 2023-05-16 VITALS — BP 124/73 | HR 76 | Temp 98.7°F | Resp 20 | Ht 64.0 in | Wt 199.0 lb

## 2023-05-16 DIAGNOSIS — Z1721 Progesterone receptor positive status: Secondary | ICD-10-CM | POA: Diagnosis not present

## 2023-05-16 DIAGNOSIS — Z1732 Human epidermal growth factor receptor 2 negative status: Secondary | ICD-10-CM | POA: Diagnosis not present

## 2023-05-16 DIAGNOSIS — Z170 Estrogen receptor positive status [ER+]: Secondary | ICD-10-CM

## 2023-05-16 DIAGNOSIS — Z79811 Long term (current) use of aromatase inhibitors: Secondary | ICD-10-CM | POA: Diagnosis not present

## 2023-05-16 DIAGNOSIS — C50112 Malignant neoplasm of central portion of left female breast: Secondary | ICD-10-CM | POA: Diagnosis present

## 2023-05-16 DIAGNOSIS — C50012 Malignant neoplasm of nipple and areola, left female breast: Secondary | ICD-10-CM

## 2023-05-16 LAB — CMP (CANCER CENTER ONLY)
ALT: 20 U/L (ref 0–44)
AST: 30 U/L (ref 15–41)
Albumin: 4.5 g/dL (ref 3.5–5.0)
Alkaline Phosphatase: 63 U/L (ref 38–126)
Anion gap: 8 (ref 5–15)
BUN: 23 mg/dL (ref 8–23)
CO2: 30 mmol/L (ref 22–32)
Calcium: 10.4 mg/dL — ABNORMAL HIGH (ref 8.9–10.3)
Chloride: 106 mmol/L (ref 98–111)
Creatinine: 1.03 mg/dL — ABNORMAL HIGH (ref 0.44–1.00)
GFR, Estimated: 55 mL/min — ABNORMAL LOW (ref >60)
Glucose, Bld: 91 mg/dL (ref 70–99)
Potassium: 4.5 mmol/L (ref 3.5–5.1)
Sodium: 144 mmol/L (ref 135–145)
Total Bilirubin: 0.5 mg/dL (ref 0.0–1.2)
Total Protein: 7.1 g/dL (ref 6.5–8.1)

## 2023-05-16 LAB — CBC WITH DIFFERENTIAL (CANCER CENTER ONLY)
Abs Immature Granulocytes: 0.02 10*3/uL (ref 0.00–0.07)
Basophils Absolute: 0.1 10*3/uL (ref 0.0–0.1)
Basophils Relative: 1 %
Eosinophils Absolute: 0.4 10*3/uL (ref 0.0–0.5)
Eosinophils Relative: 7 %
HCT: 46.8 % — ABNORMAL HIGH (ref 36.0–46.0)
Hemoglobin: 14.9 g/dL (ref 12.0–15.0)
Immature Granulocytes: 0 %
Lymphocytes Relative: 25 %
Lymphs Abs: 1.4 10*3/uL (ref 0.7–4.0)
MCH: 29 pg (ref 26.0–34.0)
MCHC: 31.8 g/dL (ref 30.0–36.0)
MCV: 91.1 fL (ref 80.0–100.0)
Monocytes Absolute: 0.7 10*3/uL (ref 0.1–1.0)
Monocytes Relative: 11 %
Neutro Abs: 3.2 10*3/uL (ref 1.7–7.7)
Neutrophils Relative %: 56 %
Platelet Count: 210 10*3/uL (ref 150–400)
RBC: 5.14 MIL/uL — ABNORMAL HIGH (ref 3.87–5.11)
RDW: 14.6 % (ref 11.5–15.5)
WBC Count: 5.7 10*3/uL (ref 4.0–10.5)
nRBC: 0 % (ref 0.0–0.2)

## 2023-05-16 MED ORDER — DENOSUMAB 60 MG/ML ~~LOC~~ SOSY
60.0000 mg | PREFILLED_SYRINGE | Freq: Once | SUBCUTANEOUS | Status: AC
  Administered 2023-05-16: 60 mg via SUBCUTANEOUS
  Filled 2023-05-16: qty 1

## 2023-05-16 NOTE — Progress Notes (Signed)
 Hematology and Oncology Follow Up Visit  Kimberly [name] [id number] Jun 08, 1940 83 y.o. 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  Oncotype score is 7  Current Therapy:   Femara 2.5 mg by mouth daily -- completed 5 yrs in 05/2020 Prolia 60 mg subcutaneous every 6 months - next dose in March 2025  S/p XRT - completed in May 2017 - 50.4 [place]     Interim History:  Ms. Kimberly [name] is back for follow-up.  So far, everything is going pretty well for her.  We last saw her 6 months ago.  She really has had no problems since we last saw her.  She did have some problems with a pinched nerve in the right arm.  She is seeing a [facility] for this.  I do not have any problems with this.  I am sure that the chiropractor is doing a good job in alleviating the nerve compression.  She has had no cough or shortness of breath.  She has had no bleeding.  There is been no leg swelling.  She has had no rashes.  She has always had a elevated CA 27.29.  The last level was 38.7.  We have never found that she has had obvious recurrence.  Overall, I would say that her performance status is probably ECOG 1.     Medications:  Current Outpatient Medications:    acetaminophen (TYLENOL) 650 MG CR tablet, Take 650 mg by mouth 2 (two) times daily. Take 2 pills in the PM, Disp: , Rfl:    Ascorbic Acid (VITAMIN C) 1000 MG tablet, Take 1,000 mg by mouth daily., Disp: , Rfl:    aspirin EC 81 MG tablet, Take 81 mg by mouth daily., Disp: , Rfl:    calcium carbonate (OS-CAL) 600 MG TABS tablet, Take 600 mg by mouth daily. , Disp: , Rfl:    Cholecalciferol (VITAMIN D) 2000 units CAPS, Take by mouth daily., Disp: , Rfl:    Coenzyme Q10-Vitamin E (QUNOL ULTRA COQ10 PO), Take 2 capsules by mouth daily., Disp: , Rfl:    denosumab (PROLIA) 60 MG/ML SOSY injection, Inject into the skin every 6 (six) months., Disp: , Rfl:    Glucosamine-Chondroit-Vit C-Mn (GLUCOSAMINE  CHONDR 500 COMPLEX PO), Take by mouth daily., Disp: , Rfl:    ibuprofen (ADVIL) 200 MG tablet, Take 200 mg by mouth 2 (two) times daily., Disp: , Rfl:    loratadine (CLARITIN) 10 MG tablet, Take 10 mg by mouth daily., Disp: , Rfl:    Multiple Vitamin (MULTI-VITAMINS) TABS, Take by mouth daily., Disp: , Rfl:    Pyridoxine HCl (VITAMIN B-6 PO), Take by mouth daily. , Disp: , Rfl:    Turmeric (QC TUMERIC COMPLEX PO), Take 1,000 mg by mouth daily., Disp: , Rfl:    zinc gluconate 50 MG tablet, Take 50 mg by mouth daily., Disp: , Rfl:    hydrochlorothiazide (HYDRODIURIL) 25 MG tablet, Take by mouth daily as needed. (Patient not taking: Reported on 05/16/2023), Disp: , Rfl:    meclizine (ANTIVERT) 25 MG tablet, Take by mouth 2 (two) times daily as needed. (Patient not taking: Reported on 05/16/2023), Disp: , Rfl:   Allergies:  Allergies  Allergen Reactions   Codeine Nausea And Vomiting and Other (See Comments)    Head feels funny.   Codeine Sulfate Other (See Comments)    Head feels funny.    Past Medical History, Surgical history, Social history, and Family  History were reviewed and updated.  Review of Systems: Review of Systems  Constitutional: Negative.   HENT: Negative.    Eyes: Negative.   Respiratory: Negative.    Cardiovascular: Negative.   Gastrointestinal: Negative.   Genitourinary: Negative.   Musculoskeletal:  Positive for joint pain.  Skin: Negative.   Neurological: Negative.   Endo/Heme/Allergies: Negative.   Psychiatric/Behavioral: Negative.       Physical Exam:  height is 5\' 4"  (1.626 m) and weight is 199 lb (90.3 kg). Her oral temperature is 98.7 F (37.1 C). Her blood pressure is 124/73 and her pulse is 76. Her respiration is 20.   Wt Readings from Last 3 Encounters:  05/16/23 199 lb (90.3 kg)  11/15/22 202 lb 1.3 oz (91.7 kg)  05/10/22 205 lb 6.4 oz (93.2 kg)   Her vital signs show temperature of 98.1.  Pulse is 63.  Blood pressure 124/73.  Weight is 199  pounds.  Physical Exam Vitals reviewed.  Constitutional:      Comments: Breast exam shows right breast but no masses, edema or erythema.  There is no right axillary adenopathy.  Left chest wall shows well-healed mastectomy.  There is no chest wall nodules or erythema.  There is no tenderness.  She has no left axillary adenopathy.  HENT:     Head: Normocephalic and atraumatic.  Eyes:     Pupils: Pupils are equal, round, and reactive to light.  Cardiovascular:     Rate and Rhythm: Normal rate and regular rhythm.     Heart sounds: Normal heart sounds.  Pulmonary:     Effort: Pulmonary effort is normal.     Breath sounds: Normal breath sounds.  Abdominal:     General: Bowel sounds are normal.     Palpations: Abdomen is soft.  Musculoskeletal:        General: No tenderness or deformity. Normal range of motion.     Cervical back: Normal range of motion.  Lymphadenopathy:     Cervical: No cervical adenopathy.  Skin:    General: Skin is warm and dry.     Findings: No erythema or rash.  Neurological:     Mental Status: She is alert and oriented to person, place, and time.  Psychiatric:        Behavior: Behavior normal.        Thought Content: Thought content normal.        Judgment: Judgment normal.    Lab Results  Component Value Date   WBC 5.7 05/16/2023   HGB 14.9 05/16/2023   HCT 46.8 (H) 05/16/2023   MCV 91.1 05/16/2023   PLT 210 05/16/2023     Chemistry      Component Value Date/Time   NA 144 05/16/2023 0903   NA 143 11/01/2016 1030   NA 141 02/03/2016 1118   K 4.5 05/16/2023 0903   K 4.8 (H) 11/01/2016 1030   K 4.0 02/03/2016 1118   CL 106 05/16/2023 0903   CL 107 11/01/2016 1030   CO2 30 05/16/2023 0903   CO2 30 11/01/2016 1030   CO2 26 02/03/2016 1118   BUN 23 05/16/2023 0903   BUN 25 (H) 11/01/2016 1030   BUN 21.9 02/03/2016 1118   CREATININE 1.03 (H) 05/16/2023 0903   CREATININE 0.9 11/01/2016 1030   CREATININE 0.9 02/03/2016 1118      Component Value  Date/Time   CALCIUM 10.4 (H) 05/16/2023 0903   CALCIUM 10.0 11/01/2016 1030   CALCIUM 10.0 02/03/2016 1118  ALKPHOS 63 05/16/2023 0903   ALKPHOS 82 11/01/2016 1030   ALKPHOS 99 02/03/2016 1118   AST 30 05/16/2023 0903   AST 32 02/03/2016 1118   ALT 20 05/16/2023 0903   ALT 29 11/01/2016 1030   ALT 27 02/03/2016 1118   BILITOT 0.5 05/16/2023 0903   BILITOT 0.36 02/03/2016 1118  Oh   Impression and Plan: Ms. Butchko is a 83 year old postmenopausal female with stage IIb ductal carcinoma of the left breast. She had one positive lymph node. She had a very low Oncotype score of 7, so I did not think that there will be a role for chemotherapy.  As always, she is doing quite nicely.  Everything is looking quite good from my perspective.  She is now out from surgery and by 8 years.  I think that she gets her Prolia today.  We will continue to follow her along.  I will see her back in 6 more months.     [name], MD 3/25/20259:46 AM

## 2023-05-16 NOTE — Patient Instructions (Signed)
 Denosumab Injection (Osteoporosis) What is this medication? DENOSUMAB (den oh SUE mab) prevents and treats osteoporosis. It works by Interior and spatial designer stronger and less likely to break (fracture). It is a monoclonal antibody. This medicine may be used for other purposes; ask your health care provider or pharmacist if you have questions. COMMON BRAND NAME(S): Prolia What should I tell my care team before I take this medication? They need to know if you have any of these conditions: Dental or gum disease Had thyroid or parathyroid (glands located in neck) surgery Having dental surgery or a tooth pulled Kidney disease Low levels of calcium in the blood On dialysis Poor nutrition Thyroid disease Trouble absorbing nutrients from your food An unusual or allergic reaction to denosumab, other medications, foods, dyes, or preservatives Pregnant or trying to get pregnant Breastfeeding How should I use this medication? This medication is injected under the skin. It is given by your care team in a hospital or clinic setting. A special MedGuide will be given to you before each treatment. Be sure to read this information carefully each time. Talk to your care team about the use of this medication in children. Special care may be needed. Overdosage: If you think you have taken too much of this medicine contact a poison control center or emergency room at once. NOTE: This medicine is only for you. Do not share this medicine with others. What if I miss a dose? Keep appointments for follow-up doses. It is important not to miss your dose. Call your care team if you are unable to keep an appointment. What may interact with this medication? Do not take this medication with any of the following: Other medications that contain denosumab This medication may also interact with the following: Medications that lower your chance of fighting infection Steroid medications, such as prednisone or cortisone This  list may not describe all possible interactions. Give your health care provider a list of all the medicines, herbs, non-prescription drugs, or dietary supplements you use. Also tell them if you smoke, drink alcohol, or use illegal drugs. Some items may interact with your medicine. What should I watch for while using this medication? Your condition will be monitored carefully while you are receiving this medication. You may need blood work done while taking this medication. This medication may increase your risk of getting an infection. Call your care team for advice if you get a fever, chills, sore throat, or other symptoms of a cold or flu. Do not treat yourself. Try to avoid being around people who are sick. Tell your dentist and dental surgeon that you are taking this medication. You should not have major dental surgery while on this medication. See your dentist to have a dental exam and fix any dental problems before starting this medication. Take good care of your teeth while on this medication. Make sure you see your dentist for regular follow-up appointments. This medication may cause low levels of calcium in your body. The risk of severe side effects is increased in people with kidney disease. Your care team may prescribe calcium and vitamin D to help prevent low calcium levels while you take this medication. It is important to take calcium and vitamin D as directed by your care team. Talk to your care team if you may be pregnant. Serious birth defects may occur if you take this medication during pregnancy and for 5 months after the last dose. You will need a negative pregnancy test before starting this medication. Contraception  is recommended while taking this medication and for 5 months after the last dose. Your care team can help you find the option that works for you. Talk to your care team before breastfeeding. Changes to your treatment plan may be needed. What side effects may I notice from  receiving this medication? Side effects that you should report to your care team as soon as possible: Allergic reactions--skin rash, itching, hives, swelling of the face, lips, tongue, or throat Infection--fever, chills, cough, sore throat, wounds that don't heal, pain or trouble when passing urine, general feeling of discomfort or being unwell Low calcium level--muscle pain or cramps, confusion, tingling, or numbness in the hands or feet Osteonecrosis of the jaw--pain, swelling, or redness in the mouth, numbness of the jaw, poor healing after dental work, unusual discharge from the mouth, visible bones in the mouth Severe bone, joint, or muscle pain Skin infection--skin redness, swelling, warmth, or pain Side effects that usually do not require medical attention (report these to your care team if they continue or are bothersome): Back pain Headache Joint pain Muscle pain Pain in the hands, arms, legs, or feet Runny or stuffy nose Sore throat This list may not describe all possible side effects. Call your doctor for medical advice about side effects. You may report side effects to FDA at 1-800-FDA-1088. Where should I keep my medication? This medication is given in a hospital or clinic. It will not be stored at home. NOTE: This sheet is a summary. It may not cover all possible information. If you have questions about this medicine, talk to your doctor, pharmacist, or health care provider.  2024 Elsevier/Gold Standard (2022-03-15 00:00:00)

## 2023-05-17 LAB — CANCER ANTIGEN 27.29: CA 27.29: 46.7 U/mL — ABNORMAL HIGH (ref 0.0–38.6)

## 2023-05-19 ENCOUNTER — Other Ambulatory Visit: Payer: Self-pay | Admitting: Hematology & Oncology

## 2023-05-19 ENCOUNTER — Telehealth: Payer: Self-pay | Admitting: *Deleted

## 2023-05-19 DIAGNOSIS — C50012 Malignant neoplasm of nipple and areola, left female breast: Secondary | ICD-10-CM

## 2023-05-19 NOTE — Telephone Encounter (Signed)
 Call received from patient regarding elevation in CA 27.29 from last lab draw.  Dr. Myna Hidalgo notified.  Call placed back to patient and patient notified per order of Dr. Myna Hidalgo that he is aware of the slight elevation of the CA 27.29 and will order a CT scan for her.  Pt is appreciative of assistance and has no further questions at this time.

## 2023-05-26 ENCOUNTER — Ambulatory Visit (HOSPITAL_COMMUNITY)
Admission: RE | Admit: 2023-05-26 | Discharge: 2023-05-26 | Disposition: A | Discharge status: Home / Self Care | Source: Ambulatory Visit | Attending: Hematology & Oncology | Admitting: Hematology & Oncology

## 2023-05-26 ENCOUNTER — Encounter (HOSPITAL_COMMUNITY): Payer: Self-pay

## 2023-05-26 DIAGNOSIS — C50012 Malignant neoplasm of nipple and areola, left female breast: Secondary | ICD-10-CM | POA: Diagnosis present

## 2023-05-26 MED ORDER — SODIUM CHLORIDE (PF) 0.9 % IJ SOLN
INTRAMUSCULAR | Status: AC
  Filled 2023-05-26: qty 50

## 2023-05-26 MED ORDER — IOHEXOL 300 MG/ML  SOLN
100.0000 mL | Freq: Once | INTRAMUSCULAR | Status: AC | PRN
  Administered 2023-05-26: 100 mL via INTRAVENOUS

## 2023-05-30 ENCOUNTER — Encounter: Payer: Self-pay | Admitting: *Deleted

## 2023-06-16 IMAGING — MG MM DIGITAL SCREENING UNILAT*R* W/ TOMO W/ CAD
4 series · 4 of 12 positions shown · non-contrast
Comparison: Previous exam(s).

CLINICAL DATA: Screening.

EXAM:
DIGITAL SCREENING UNILATERAL RIGHT MAMMOGRAM WITH CAD AND
TOMOSYNTHESIS
TECHNIQUE: Right screening digital craniocaudal and mediolateral oblique
mammograms were obtained. Right screening digital breast
tomosynthesis was performed. The images were evaluated with
computer-aided detection.

[R CC synth-2D]
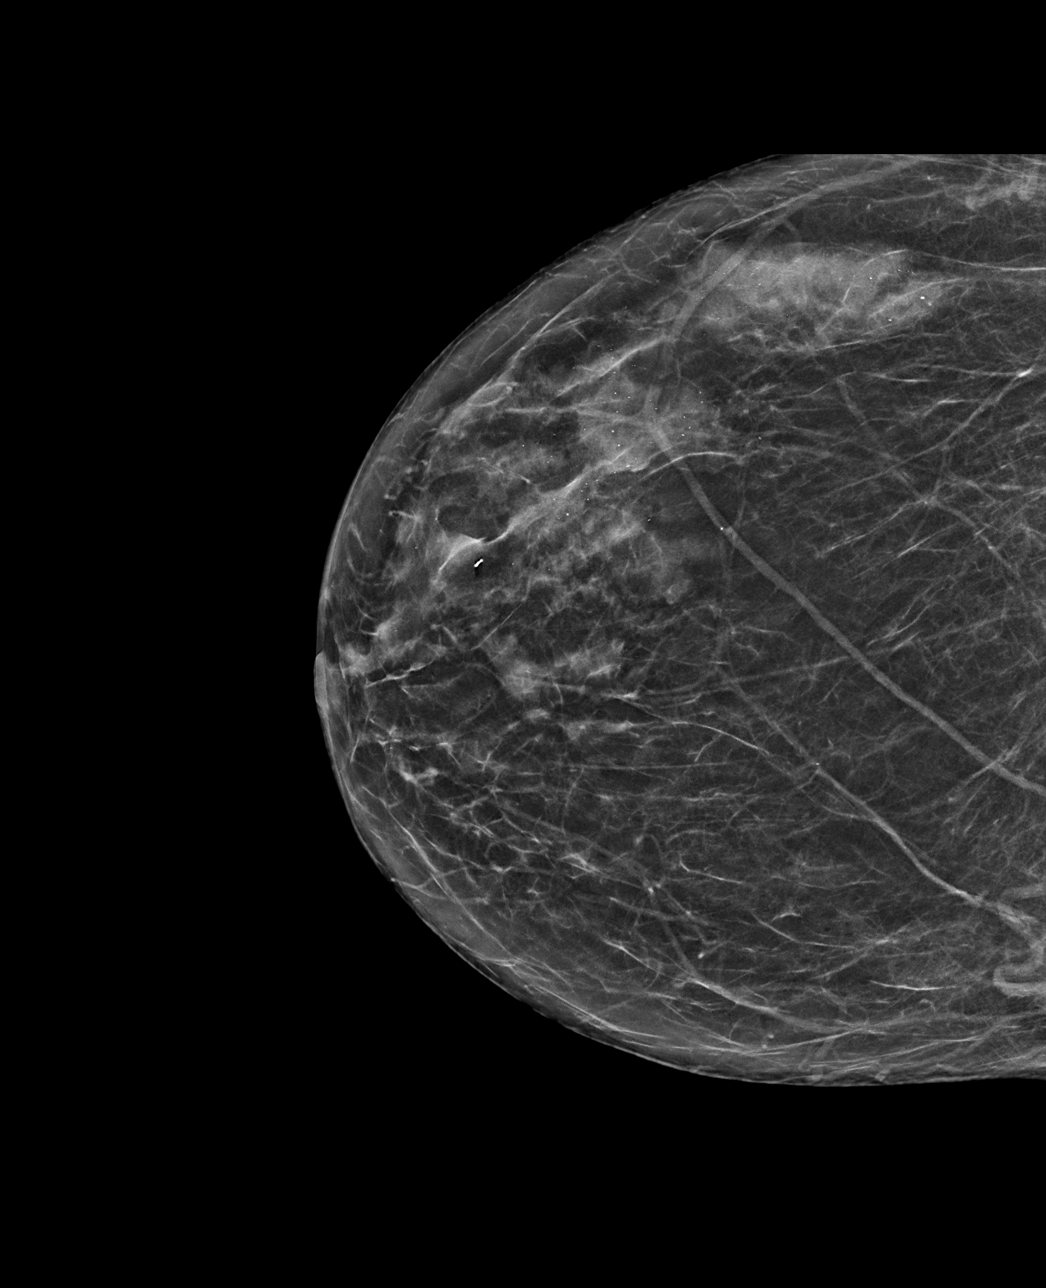

[R MLO synth-2D]
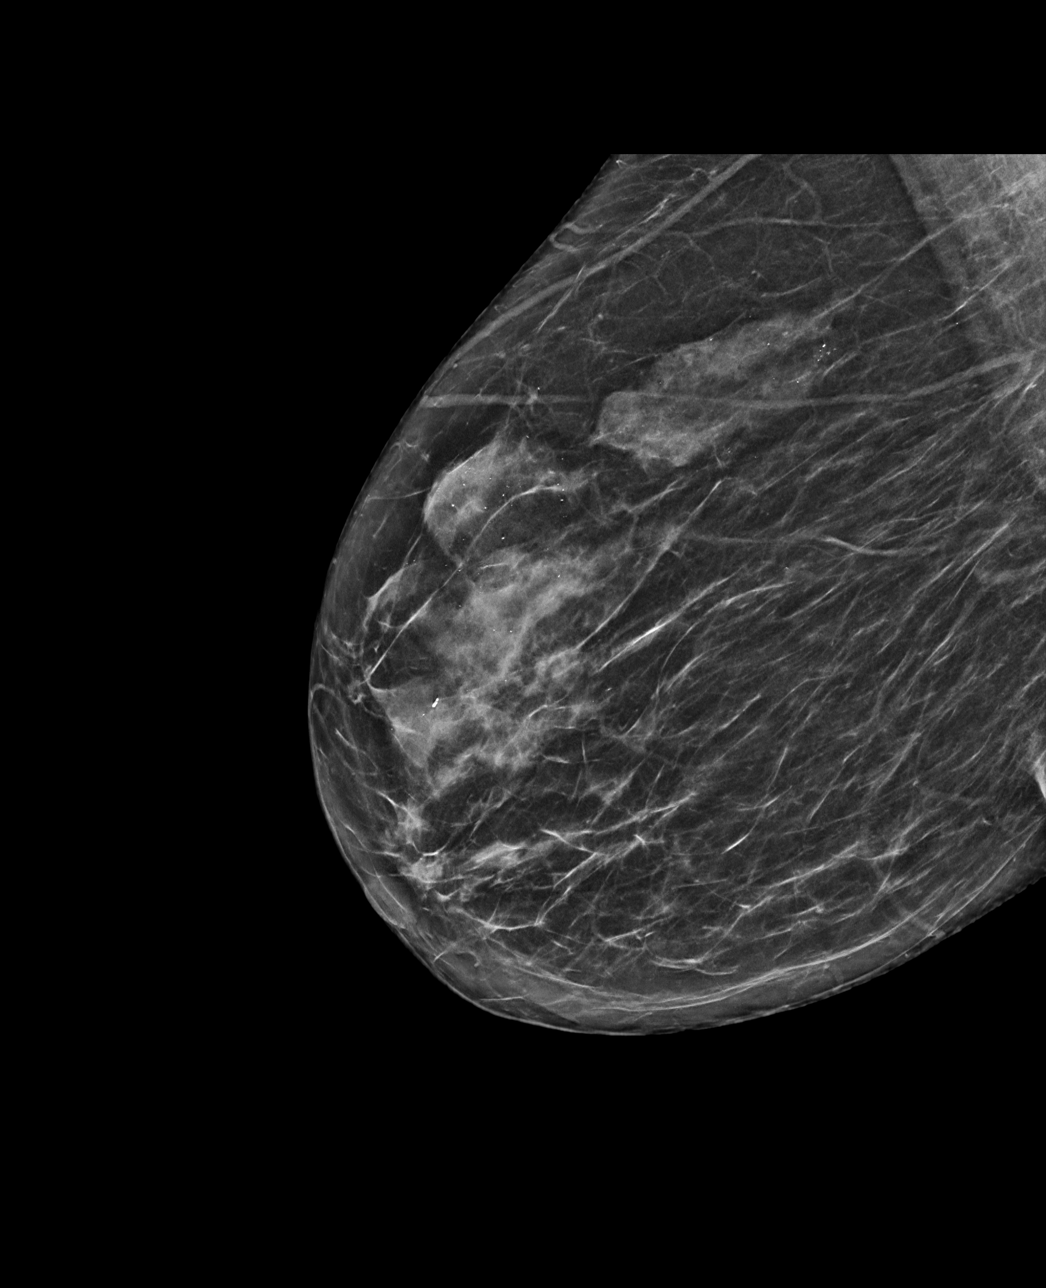

[R MLO tomo · tomo slice 33/66.0]
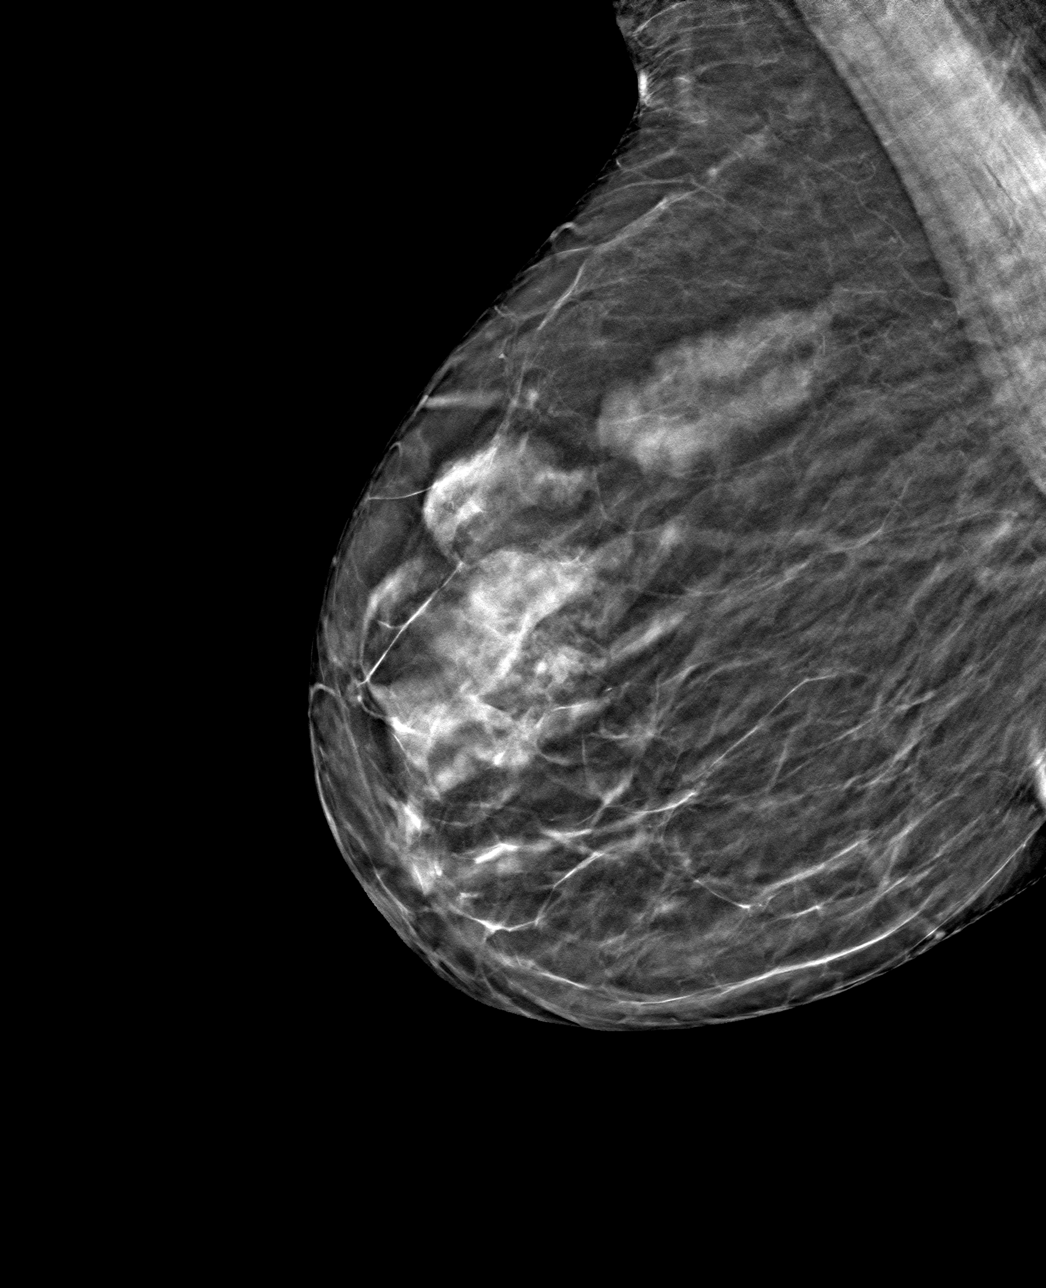

[R CC tomo · tomo slice 31/60.0]
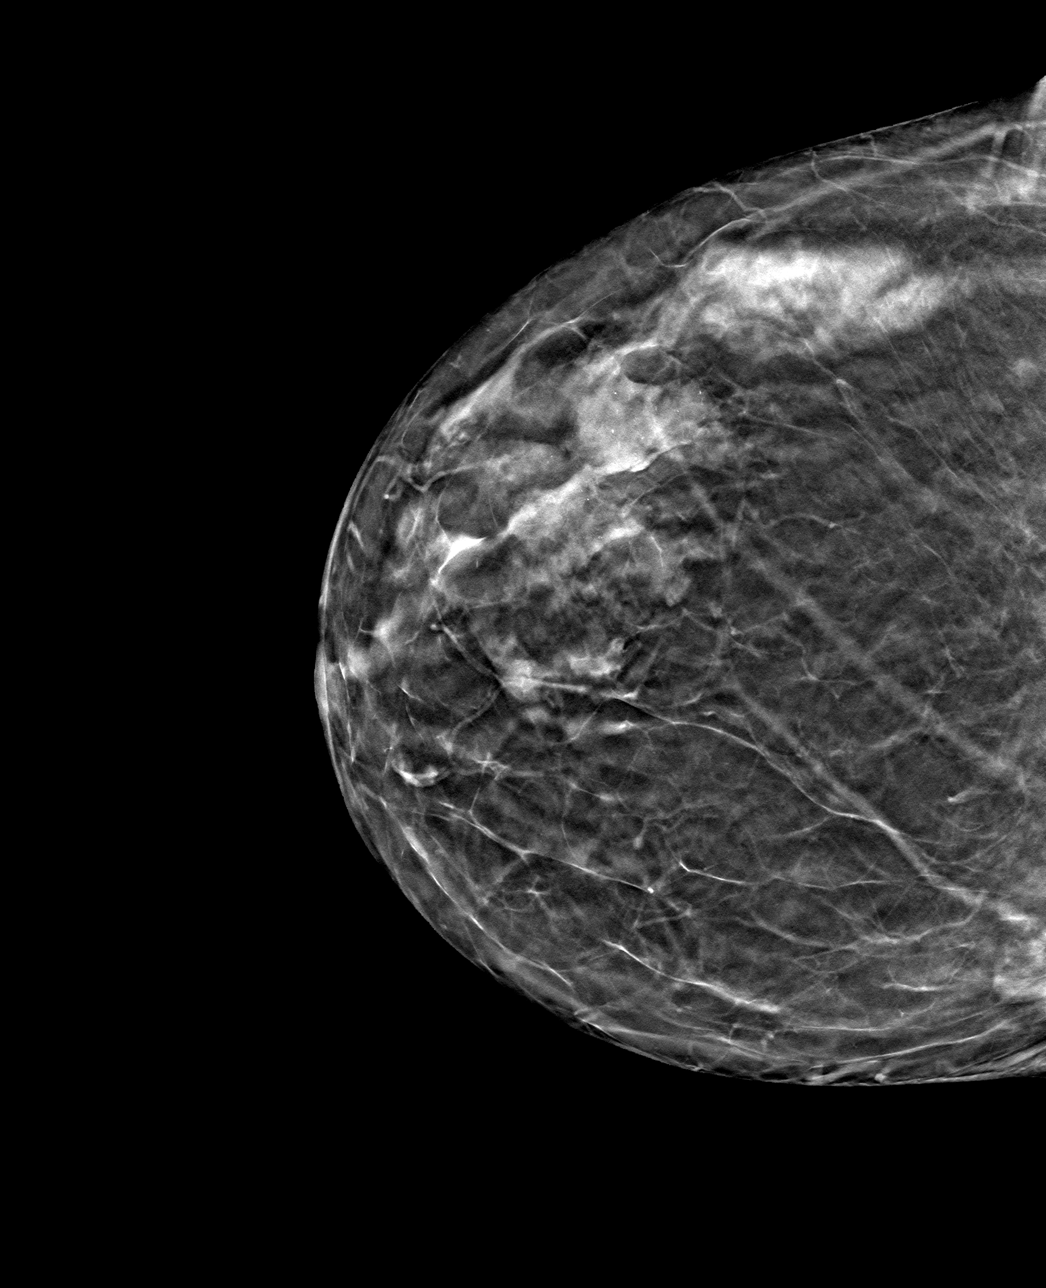

[4 of 12 positions shown; findings below may reference images not displayed]

ACR Breast Density Category b: There are scattered areas of
fibroglandular density.
FINDINGS: The patient has had a left mastectomy. There are no findings
suspicious for malignancy.
IMPRESSION: No mammographic evidence of malignancy. A result letter of this
screening mammogram will be mailed directly to the patient.

RECOMMENDATION:
Screening mammogram in one year.  (Code:NT-E-EGT)

BI-RADS CATEGORY  1: Negative.

## 2023-11-03 ENCOUNTER — Other Ambulatory Visit: Payer: Self-pay | Admitting: Internal Medicine

## 2023-11-03 DIAGNOSIS — Z1231 Encounter for screening mammogram for malignant neoplasm of breast: Secondary | ICD-10-CM

## 2023-11-14 ENCOUNTER — Inpatient Hospital Stay (HOSPITAL_BASED_OUTPATIENT_CLINIC_OR_DEPARTMENT_OTHER): Admitting: Hematology & Oncology

## 2023-11-14 ENCOUNTER — Encounter: Payer: Self-pay | Admitting: Hematology & Oncology

## 2023-11-14 ENCOUNTER — Inpatient Hospital Stay: Attending: Hematology & Oncology

## 2023-11-14 ENCOUNTER — Ambulatory Visit (HOSPITAL_BASED_OUTPATIENT_CLINIC_OR_DEPARTMENT_OTHER)
Admission: RE | Admit: 2023-11-14 | Discharge: 2023-11-14 | Disposition: A | Discharge status: Home / Self Care | Source: Ambulatory Visit | Attending: Hematology & Oncology | Admitting: Hematology & Oncology

## 2023-11-14 ENCOUNTER — Inpatient Hospital Stay

## 2023-11-14 VITALS — BP 115/69 | HR 61 | Temp 98.0°F | Resp 18 | Ht 64.0 in | Wt 198.1 lb

## 2023-11-14 DIAGNOSIS — Z170 Estrogen receptor positive status [ER+]: Secondary | ICD-10-CM | POA: Insufficient documentation

## 2023-11-14 DIAGNOSIS — C50112 Malignant neoplasm of central portion of left female breast: Secondary | ICD-10-CM

## 2023-11-14 DIAGNOSIS — M80072G Age-related osteoporosis with current pathological fracture, left ankle and foot, subsequent encounter for fracture with delayed healing: Secondary | ICD-10-CM | POA: Diagnosis not present

## 2023-11-14 DIAGNOSIS — C50012 Malignant neoplasm of nipple and areola, left female breast: Secondary | ICD-10-CM

## 2023-11-14 DIAGNOSIS — Z9013 Acquired absence of bilateral breasts and nipples: Secondary | ICD-10-CM

## 2023-11-14 DIAGNOSIS — N951 Menopausal and female climacteric states: Secondary | ICD-10-CM

## 2023-11-14 DIAGNOSIS — M810 Age-related osteoporosis without current pathological fracture: Secondary | ICD-10-CM | POA: Diagnosis not present

## 2023-11-14 DIAGNOSIS — N200 Calculus of kidney: Secondary | ICD-10-CM

## 2023-11-14 DIAGNOSIS — Z780 Asymptomatic menopausal state: Secondary | ICD-10-CM | POA: Diagnosis not present

## 2023-11-14 DIAGNOSIS — C50212 Malignant neoplasm of upper-inner quadrant of left female breast: Secondary | ICD-10-CM

## 2023-11-14 DIAGNOSIS — Z923 Personal history of irradiation: Secondary | ICD-10-CM | POA: Diagnosis not present

## 2023-11-14 DIAGNOSIS — M818 Other osteoporosis without current pathological fracture: Secondary | ICD-10-CM

## 2023-11-14 DIAGNOSIS — Z45819 Encounter for adjustment or removal of unspecified breast implant: Secondary | ICD-10-CM

## 2023-11-14 DIAGNOSIS — M1712 Unilateral primary osteoarthritis, left knee: Secondary | ICD-10-CM

## 2023-11-14 LAB — CBC WITH DIFFERENTIAL (CANCER CENTER ONLY)
Abs Immature Granulocytes: 0.02 K/uL (ref 0.00–0.07)
Basophils Absolute: 0.1 K/uL (ref 0.0–0.1)
Basophils Relative: 2 %
Eosinophils Absolute: 0.4 K/uL (ref 0.0–0.5)
Eosinophils Relative: 7 %
HCT: 44.8 % (ref 36.0–46.0)
Hemoglobin: 14.2 g/dL (ref 12.0–15.0)
Immature Granulocytes: 0 %
Lymphocytes Relative: 25 %
Lymphs Abs: 1.4 K/uL (ref 0.7–4.0)
MCH: 28.7 pg (ref 26.0–34.0)
MCHC: 31.7 g/dL (ref 30.0–36.0)
MCV: 90.7 fL (ref 80.0–100.0)
Monocytes Absolute: 0.7 K/uL (ref 0.1–1.0)
Monocytes Relative: 13 %
Neutro Abs: 2.9 K/uL (ref 1.7–7.7)
Neutrophils Relative %: 53 %
Platelet Count: 204 K/uL (ref 150–400)
RBC: 4.94 MIL/uL (ref 3.87–5.11)
RDW: 14.7 % (ref 11.5–15.5)
WBC Count: 5.5 K/uL (ref 4.0–10.5)
nRBC: 0 % (ref 0.0–0.2)

## 2023-11-14 LAB — CMP (CANCER CENTER ONLY)
ALT: 23 U/L (ref 0–44)
AST: 34 U/L (ref 15–41)
Albumin: 4.4 g/dL (ref 3.5–5.0)
Alkaline Phosphatase: 67 U/L (ref 38–126)
Anion gap: 11 (ref 5–15)
BUN: 22 mg/dL (ref 8–23)
CO2: 27 mmol/L (ref 22–32)
Calcium: 10.4 mg/dL — ABNORMAL HIGH (ref 8.9–10.3)
Chloride: 108 mmol/L (ref 98–111)
Creatinine: 1.02 mg/dL — ABNORMAL HIGH (ref 0.44–1.00)
GFR, Estimated: 55 mL/min — ABNORMAL LOW (ref >60)
Glucose, Bld: 97 mg/dL (ref 70–99)
Potassium: 5.1 mmol/L (ref 3.5–5.1)
Sodium: 146 mmol/L — ABNORMAL HIGH (ref 135–145)
Total Bilirubin: 0.5 mg/dL (ref 0.0–1.2)
Total Protein: 7.5 g/dL (ref 6.5–8.1)

## 2023-11-14 MED ORDER — DENOSUMAB 60 MG/ML ~~LOC~~ SOSY
60.0000 mg | PREFILLED_SYRINGE | Freq: Once | SUBCUTANEOUS | Status: AC
  Administered 2023-11-14: 60 mg via SUBCUTANEOUS
  Filled 2023-11-14: qty 1

## 2023-11-14 NOTE — Patient Instructions (Signed)
 Denosumab Injection (Osteoporosis) What is this medication? DENOSUMAB (den oh SUE mab) prevents and treats osteoporosis. It works by Interior and spatial designer stronger and less likely to break (fracture). It is a monoclonal antibody. This medicine may be used for other purposes; ask your health care provider or pharmacist if you have questions. COMMON BRAND NAME(S): Prolia What should I tell my care team before I take this medication? They need to know if you have any of these conditions: Dental or gum disease Had thyroid or parathyroid (glands located in neck) surgery Having dental surgery or a tooth pulled Kidney disease Low levels of calcium in the blood On dialysis Poor nutrition Thyroid disease Trouble absorbing nutrients from your food An unusual or allergic reaction to denosumab, other medications, foods, dyes, or preservatives Pregnant or trying to get pregnant Breastfeeding How should I use this medication? This medication is injected under the skin. It is given by your care team in a hospital or clinic setting. A special MedGuide will be given to you before each treatment. Be sure to read this information carefully each time. Talk to your care team about the use of this medication in children. Special care may be needed. Overdosage: If you think you have taken too much of this medicine contact a poison control center or emergency room at once. NOTE: This medicine is only for you. Do not share this medicine with others. What if I miss a dose? Keep appointments for follow-up doses. It is important not to miss your dose. Call your care team if you are unable to keep an appointment. What may interact with this medication? Do not take this medication with any of the following: Other medications that contain denosumab This medication may also interact with the following: Medications that lower your chance of fighting infection Steroid medications, such as prednisone or cortisone This  list may not describe all possible interactions. Give your health care provider a list of all the medicines, herbs, non-prescription drugs, or dietary supplements you use. Also tell them if you smoke, drink alcohol, or use illegal drugs. Some items may interact with your medicine. What should I watch for while using this medication? Your condition will be monitored carefully while you are receiving this medication. You may need blood work done while taking this medication. This medication may increase your risk of getting an infection. Call your care team for advice if you get a fever, chills, sore throat, or other symptoms of a cold or flu. Do not treat yourself. Try to avoid being around people who are sick. Tell your dentist and dental surgeon that you are taking this medication. You should not have major dental surgery while on this medication. See your dentist to have a dental exam and fix any dental problems before starting this medication. Take good care of your teeth while on this medication. Make sure you see your dentist for regular follow-up appointments. This medication may cause low levels of calcium in your body. The risk of severe side effects is increased in people with kidney disease. Your care team may prescribe calcium and vitamin D to help prevent low calcium levels while you take this medication. It is important to take calcium and vitamin D as directed by your care team. Talk to your care team if you may be pregnant. Serious birth defects may occur if you take this medication during pregnancy and for 5 months after the last dose. You will need a negative pregnancy test before starting this medication. Contraception  is recommended while taking this medication and for 5 months after the last dose. Your care team can help you find the option that works for you. Talk to your care team before breastfeeding. Changes to your treatment plan may be needed. What side effects may I notice from  receiving this medication? Side effects that you should report to your care team as soon as possible: Allergic reactions--skin rash, itching, hives, swelling of the face, lips, tongue, or throat Infection--fever, chills, cough, sore throat, wounds that don't heal, pain or trouble when passing urine, general feeling of discomfort or being unwell Low calcium level--muscle pain or cramps, confusion, tingling, or numbness in the hands or feet Osteonecrosis of the jaw--pain, swelling, or redness in the mouth, numbness of the jaw, poor healing after dental work, unusual discharge from the mouth, visible bones in the mouth Severe bone, joint, or muscle pain Skin infection--skin redness, swelling, warmth, or pain Side effects that usually do not require medical attention (report these to your care team if they continue or are bothersome): Back pain Headache Joint pain Muscle pain Pain in the hands, arms, legs, or feet Runny or stuffy nose Sore throat This list may not describe all possible side effects. Call your doctor for medical advice about side effects. You may report side effects to FDA at 1-800-FDA-1088. Where should I keep my medication? This medication is given in a hospital or clinic. It will not be stored at home. NOTE: This sheet is a summary. It may not cover all possible information. If you have questions about this medicine, talk to your doctor, pharmacist, or health care provider.  2024 Elsevier/Gold Standard (2022-03-15 00:00:00)

## 2023-11-14 NOTE — Progress Notes (Signed)
 Hematology and Oncology Follow Up Visit  Kimberly [name] [id number] 23-Apr-1940 83 y.o. 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  Oncotype score is 7  Current Therapy:   Femara  2.5 mg by mouth daily -- completed 5 yrs in 05/2020 Prolia  60 mg subcutaneous every 6 months - next dose in March 2026  S/p XRT - completed in May 2017 - 50.4 [place]     Interim History:  Ms. Kimberly [name] is back for follow-up.  So far, everything is going pretty well for her.  We last saw her 6 months ago.  She really has had no problems since we last saw her.  9 she is having some back issues.  I am unsure exactly what might be going on.  There is no pain in her legs.  This could certainly be spinal stenosis.  She may need to have some x-rays.  I am unsure we can get an MRI to be approved.     She has had no problems with cough or shortness of breath.  She has had no change in bowel or bladder habits.  She has had no issues with bleeding.  She has had no leg swelling.  Patient does have some knee issues from past surgery.  Overall, I would have say that her performance status is probably ECOG 1.     Medications:  Current Outpatient Medications:    acetaminophen  (TYLENOL ) 650 MG CR tablet, Take 650 mg by mouth 2 (two) times daily. Take 2 pills in the PM, Disp: , Rfl:    Ascorbic Acid  (VITAMIN C ) 1000 MG tablet, Take 1,000 mg by mouth daily., Disp: , Rfl:    aspirin EC 81 MG tablet, Take 81 mg by mouth daily., Disp: , Rfl:    calcium carbonate (OS-CAL) 600 MG TABS tablet, Take 600 mg by mouth daily. , Disp: , Rfl:    Cholecalciferol (VITAMIN D ) 2000 units CAPS, Take by mouth daily., Disp: , Rfl:    Coenzyme Q10-Vitamin E (QUNOL ULTRA COQ10 PO), Take 2 capsules by mouth daily., Disp: , Rfl:    denosumab  (PROLIA ) 60 MG/ML SOSY injection, Inject into the skin every 6 (six) months., Disp: , Rfl:    Glucosamine-Chondroit-Vit C-Mn (GLUCOSAMINE CHONDR 500 COMPLEX  PO), Take by mouth daily., Disp: , Rfl:    hydrochlorothiazide (HYDRODIURIL) 25 MG tablet, Take by mouth daily as needed., Disp: , Rfl:    ibuprofen (ADVIL) 200 MG tablet, Take 200 mg by mouth 2 (two) times daily., Disp: , Rfl:    loratadine  (CLARITIN ) 10 MG tablet, Take 10 mg by mouth daily., Disp: , Rfl:    meclizine (ANTIVERT) 25 MG tablet, Take by mouth 2 (two) times daily as needed., Disp: , Rfl:    Multiple Vitamin (MULTI-VITAMINS) TABS, Take by mouth daily., Disp: , Rfl:    Pyridoxine HCl (VITAMIN B-6 PO), Take by mouth daily. , Disp: , Rfl:    Turmeric (QC TUMERIC COMPLEX PO), Take 1,000 mg by mouth daily., Disp: , Rfl:    zinc  gluconate 50 MG tablet, Take 50 mg by mouth daily., Disp: , Rfl:   Allergies:  Allergies  Allergen Reactions   Codeine Nausea And Vomiting and Other (See Comments)    Head feels funny.   Codeine Sulfate Other (See Comments)    Head feels funny.    Past Medical History, Surgical history, Social history, and Family History were reviewed and updated.  Review of Systems: Review  of Systems  Constitutional: Negative.   HENT: Negative.    Eyes: Negative.   Respiratory: Negative.    Cardiovascular: Negative.   Gastrointestinal: Negative.   Genitourinary: Negative.   Musculoskeletal:  Positive for joint pain.  Skin: Negative.   Neurological: Negative.   Endo/Heme/Allergies: Negative.   Psychiatric/Behavioral: Negative.       Physical Exam:  height is 5' 4 (1.626 m) and weight is 198 lb 1.9 oz (89.9 kg). Her oral temperature is 98 F (36.7 C). Her blood pressure is 115/69 and her pulse is 61. Her respiration is 18 and oxygen saturation is 100%.   Wt Readings from Last 3 Encounters:  11/14/23 198 lb 1.9 oz (89.9 kg)  05/16/23 199 lb (90.3 kg)  11/15/22 202 lb 1.3 oz (91.7 kg)   Her vital signs show temperature of 98.  Pulse 61.  Blood pressure 115/69.  Weight is 198 pounds.    Physical Exam Vitals reviewed.  Constitutional:      Comments:  Breast exam shows right breast but no masses, edema or erythema.  There is no right axillary adenopathy.  Left chest wall shows well-healed mastectomy.  There is no chest wall nodules or erythema.  There is no tenderness.  She has no left axillary adenopathy.  HENT:     Head: Normocephalic and atraumatic.  Eyes:     Pupils: Pupils are equal, round, and reactive to light.  Cardiovascular:     Rate and Rhythm: Normal rate and regular rhythm.     Heart sounds: Normal heart sounds.  Pulmonary:     Effort: Pulmonary effort is normal.     Breath sounds: Normal breath sounds.  Abdominal:     General: Bowel sounds are normal.     Palpations: Abdomen is soft.  Musculoskeletal:        General: No tenderness or deformity. Normal range of motion.     Cervical back: Normal range of motion.  Lymphadenopathy:     Cervical: No cervical adenopathy.  Skin:    General: Skin is warm and dry.     Findings: No erythema or rash.  Neurological:     Mental Status: She is alert and oriented to person, place, and time.  Psychiatric:        Behavior: Behavior normal.        Thought Content: Thought content normal.        Judgment: Judgment normal.    Lab Results  Component Value Date   WBC 5.5 11/14/2023   HGB 14.2 11/14/2023   HCT 44.8 11/14/2023   MCV 90.7 11/14/2023   PLT 204 11/14/2023     Chemistry      Component Value Date/Time   NA 146 (H) 11/14/2023 0857   NA 143 11/01/2016 1030   NA 141 02/03/2016 1118   K 5.1 11/14/2023 0857   K 4.8 (H) 11/01/2016 1030   K 4.0 02/03/2016 1118   CL 108 11/14/2023 0857   CL 107 11/01/2016 1030   CO2 27 11/14/2023 0857   CO2 30 11/01/2016 1030   CO2 26 02/03/2016 1118   BUN 22 11/14/2023 0857   BUN 25 (H) 11/01/2016 1030   BUN 21.9 02/03/2016 1118   CREATININE 1.02 (H) 11/14/2023 0857   CREATININE 0.9 11/01/2016 1030   CREATININE 0.9 02/03/2016 1118      Component Value Date/Time   CALCIUM 10.4 (H) 11/14/2023 0857   CALCIUM 10.0 11/01/2016  1030   CALCIUM 10.0 02/03/2016 1118   ALKPHOS 67  11/14/2023 0857   ALKPHOS 82 11/01/2016 1030   ALKPHOS 99 02/03/2016 1118   AST 34 11/14/2023 0857   AST 32 02/03/2016 1118   ALT 23 11/14/2023 0857   ALT 29 11/01/2016 1030   ALT 27 02/03/2016 1118   BILITOT 0.5 11/14/2023 0857   BILITOT 0.36 02/03/2016 1118  Oh   Impression and Plan: Ms. Kimberly [name] is a 83 year old postmenopausal female with stage IIb ductal carcinoma of the left breast. She had one positive lymph node. She had a very low Oncotype score of 7, so I did not think that there will be a role for chemotherapy.  We will see what the x-ray of the back shows.  Again, we may have to get an MRI.  Again, I would not think this is anything related to her breast cancer.  However, I think going to have to be aggressive.  To make sure that there is no problem that might be related to her malignancy.  She will get her Prolia  today.  Will still plan to get her back in 6 months unless there is a problem on her radiologic studies.   Maude JONELLE Crease, MD 9/23/202510:06 AM

## 2023-11-15 ENCOUNTER — Ambulatory Visit
Admission: RE | Admit: 2023-11-15 | Discharge: 2023-11-15 | Disposition: A | Discharge status: Home / Self Care | Source: Ambulatory Visit | Attending: Internal Medicine | Admitting: Internal Medicine

## 2023-11-15 ENCOUNTER — Encounter: Payer: Self-pay | Admitting: Hematology & Oncology

## 2023-11-15 DIAGNOSIS — Z1231 Encounter for screening mammogram for malignant neoplasm of breast: Secondary | ICD-10-CM

## 2023-11-15 LAB — CANCER ANTIGEN 27.29: CA 27.29: 43 U/mL — ABNORMAL HIGH (ref 0.0–38.6)

## 2023-11-17 ENCOUNTER — Ambulatory Visit (HOSPITAL_COMMUNITY)
Admission: RE | Admit: 2023-11-17 | Discharge: 2023-11-17 | Disposition: A | Discharge status: Home / Self Care | Source: Ambulatory Visit | Attending: Hematology & Oncology | Admitting: Hematology & Oncology

## 2023-11-17 DIAGNOSIS — C50012 Malignant neoplasm of nipple and areola, left female breast: Secondary | ICD-10-CM | POA: Diagnosis present

## 2023-11-17 DIAGNOSIS — M5124 Other intervertebral disc displacement, thoracic region: Secondary | ICD-10-CM

## 2023-11-17 DIAGNOSIS — M5134 Other intervertebral disc degeneration, thoracic region: Secondary | ICD-10-CM

## 2023-11-17 MED ORDER — GADOBUTROL 1 MMOL/ML IV SOLN
9.0000 mL | Freq: Once | INTRAVENOUS | Status: AC | PRN
  Administered 2023-11-17: 9 mL via INTRAVENOUS

## 2023-11-20 ENCOUNTER — Ambulatory Visit: Payer: Self-pay | Admitting: Hematology & Oncology

## 2024-05-14 ENCOUNTER — Ambulatory Visit: Admitting: Hematology & Oncology

## 2024-05-14 ENCOUNTER — Other Ambulatory Visit
